# Patient Record
Sex: Male | Born: 1945
Health system: Southern US, Community
[De-identification: ages and names within clinical notes are randomized; demographics above are authoritative.]

## PROBLEM LIST (undated history)

## (undated) DIAGNOSIS — Z9889 Other specified postprocedural states: Secondary | ICD-10-CM

## (undated) DIAGNOSIS — M1990 Unspecified osteoarthritis, unspecified site: Secondary | ICD-10-CM

## (undated) DIAGNOSIS — R112 Nausea with vomiting, unspecified: Secondary | ICD-10-CM

## (undated) DIAGNOSIS — G4730 Sleep apnea, unspecified: Secondary | ICD-10-CM

## (undated) DIAGNOSIS — E785 Hyperlipidemia, unspecified: Secondary | ICD-10-CM

## (undated) DIAGNOSIS — T7840XA Allergy, unspecified, initial encounter: Secondary | ICD-10-CM

## (undated) DIAGNOSIS — C801 Malignant (primary) neoplasm, unspecified: Secondary | ICD-10-CM

## (undated) DIAGNOSIS — K219 Gastro-esophageal reflux disease without esophagitis: Secondary | ICD-10-CM

## (undated) DIAGNOSIS — Z87442 Personal history of urinary calculi: Secondary | ICD-10-CM

## (undated) DIAGNOSIS — H269 Unspecified cataract: Secondary | ICD-10-CM

## (undated) DIAGNOSIS — L719 Rosacea, unspecified: Secondary | ICD-10-CM

## (undated) DIAGNOSIS — R011 Cardiac murmur, unspecified: Secondary | ICD-10-CM

## (undated) DIAGNOSIS — I10 Essential (primary) hypertension: Secondary | ICD-10-CM

## (undated) HISTORY — DX: Rosacea, unspecified: L71.9

## (undated) HISTORY — DX: Allergy, unspecified, initial encounter: T78.40XA

## (undated) HISTORY — PX: INGUINAL HERNIA REPAIR: SUR1180

## (undated) HISTORY — DX: Sleep apnea, unspecified: G47.30

## (undated) HISTORY — DX: Unspecified cataract: H26.9

## (undated) HISTORY — PX: NOSE SURGERY: SHX723

## (undated) HISTORY — DX: Gastro-esophageal reflux disease without esophagitis: K21.9

## (undated) HISTORY — DX: Malignant (primary) neoplasm, unspecified: C80.1

## (undated) HISTORY — PX: ABDOMINAL HERNIA REPAIR: SHX539

## (undated) HISTORY — PX: HEMORRHOID SURGERY: SHX153

## (undated) HISTORY — PX: OTHER SURGICAL HISTORY: SHX169

## (undated) HISTORY — DX: Essential (primary) hypertension: I10

## (undated) HISTORY — DX: Hyperlipidemia, unspecified: E78.5

## (undated) HISTORY — DX: Cardiac murmur, unspecified: R01.1

## (undated) HISTORY — PX: TONSILLECTOMY: SUR1361

---

## 1998-04-06 ENCOUNTER — Ambulatory Visit: Admission: RE | Admit: 1998-04-06 | Discharge: 1998-04-06 | Payer: Self-pay | Admitting: Internal Medicine

## 2000-07-07 ENCOUNTER — Encounter: Payer: Self-pay | Admitting: Internal Medicine

## 2000-07-07 ENCOUNTER — Ambulatory Visit (HOSPITAL_COMMUNITY): Admission: RE | Admit: 2000-07-07 | Discharge: 2000-07-07 | Payer: Self-pay | Admitting: Internal Medicine

## 2003-01-07 ENCOUNTER — Ambulatory Visit (HOSPITAL_BASED_OUTPATIENT_CLINIC_OR_DEPARTMENT_OTHER): Admission: RE | Admit: 2003-01-07 | Discharge: 2003-01-07 | Payer: Self-pay | Admitting: Otolaryngology

## 2003-03-16 ENCOUNTER — Encounter: Payer: Self-pay | Admitting: Otolaryngology

## 2003-03-17 ENCOUNTER — Encounter (INDEPENDENT_AMBULATORY_CARE_PROVIDER_SITE_OTHER): Payer: Self-pay | Admitting: Specialist

## 2003-03-17 ENCOUNTER — Ambulatory Visit (HOSPITAL_COMMUNITY): Admission: RE | Admit: 2003-03-17 | Discharge: 2003-03-18 | Payer: Self-pay | Admitting: Otolaryngology

## 2003-04-01 HISTORY — PX: COLONOSCOPY: SHX174

## 2003-06-02 ENCOUNTER — Ambulatory Visit (HOSPITAL_BASED_OUTPATIENT_CLINIC_OR_DEPARTMENT_OTHER): Admission: RE | Admit: 2003-06-02 | Discharge: 2003-06-02 | Payer: Self-pay | Admitting: Otolaryngology

## 2003-06-02 ENCOUNTER — Ambulatory Visit (HOSPITAL_COMMUNITY): Admission: RE | Admit: 2003-06-02 | Discharge: 2003-06-02 | Payer: Self-pay | Admitting: Otolaryngology

## 2003-07-01 ENCOUNTER — Ambulatory Visit (HOSPITAL_BASED_OUTPATIENT_CLINIC_OR_DEPARTMENT_OTHER): Admission: RE | Admit: 2003-07-01 | Discharge: 2003-07-01 | Payer: Self-pay | Admitting: Otolaryngology

## 2003-07-01 ENCOUNTER — Ambulatory Visit (HOSPITAL_COMMUNITY): Admission: RE | Admit: 2003-07-01 | Discharge: 2003-07-01 | Payer: Self-pay | Admitting: Otolaryngology

## 2006-06-16 ENCOUNTER — Encounter (INDEPENDENT_AMBULATORY_CARE_PROVIDER_SITE_OTHER): Payer: Self-pay | Admitting: Specialist

## 2006-06-16 ENCOUNTER — Ambulatory Visit (HOSPITAL_COMMUNITY): Admission: RE | Admit: 2006-06-16 | Discharge: 2006-06-17 | Payer: Self-pay | Admitting: Surgery

## 2008-03-21 ENCOUNTER — Ambulatory Visit (HOSPITAL_COMMUNITY): Admission: RE | Admit: 2008-03-21 | Discharge: 2008-03-21 | Payer: Self-pay | Admitting: Urology

## 2010-03-22 ENCOUNTER — Ambulatory Visit (HOSPITAL_COMMUNITY): Admission: RE | Admit: 2010-03-22 | Discharge: 2010-03-22 | Payer: Self-pay | Admitting: Cardiology

## 2010-03-22 ENCOUNTER — Ambulatory Visit: Payer: Self-pay | Admitting: Cardiology

## 2010-10-23 ENCOUNTER — Telehealth: Payer: Self-pay | Admitting: Cardiology

## 2010-10-23 DIAGNOSIS — E785 Hyperlipidemia, unspecified: Secondary | ICD-10-CM

## 2010-10-23 MED ORDER — ATORVASTATIN CALCIUM 20 MG PO TABS: 20.0000 mg | ORAL_TABLET | Freq: Every day | ORAL | Status: DC

## 2010-10-23 NOTE — Telephone Encounter (Signed)
SAMPLES OF LIPITOR, PLACED CHART IN BOX.

## 2010-10-23 NOTE — Telephone Encounter (Signed)
Pt requesting Lipitor refill.  RN will send in refill for Lipitor.

## 2010-10-24 ENCOUNTER — Other Ambulatory Visit: Payer: Self-pay | Admitting: Cardiology

## 2010-10-24 DIAGNOSIS — I10 Essential (primary) hypertension: Secondary | ICD-10-CM

## 2010-10-24 MED ORDER — AMLODIPINE BESYLATE 5 MG PO TABS: 5.0000 mg | ORAL_TABLET | Freq: Every day | ORAL | Status: DC

## 2010-10-24 NOTE — Telephone Encounter (Signed)
CALL PT ABOUT HIS MEDS. PLACED CHART IN BOX.

## 2010-11-08 ENCOUNTER — Ambulatory Visit: Payer: Self-pay | Admitting: Sports Medicine

## 2010-11-15 ENCOUNTER — Encounter: Payer: Self-pay | Admitting: Sports Medicine

## 2010-11-15 ENCOUNTER — Ambulatory Visit (INDEPENDENT_AMBULATORY_CARE_PROVIDER_SITE_OTHER): Payer: BC Managed Care – PPO | Admitting: Sports Medicine

## 2010-11-15 VITALS — BP 131/84 | Ht 69.0 in | Wt 195.0 lb

## 2010-11-15 DIAGNOSIS — M216X9 Other acquired deformities of unspecified foot: Secondary | ICD-10-CM | POA: Insufficient documentation

## 2010-11-15 DIAGNOSIS — M7750 Other enthesopathy of unspecified foot: Secondary | ICD-10-CM

## 2010-11-15 DIAGNOSIS — M7741 Metatarsalgia, right foot: Secondary | ICD-10-CM

## 2010-11-15 NOTE — Patient Instructions (Signed)
Follow up if not improving.  Start using insoles with running and with regular day to day activities.

## 2010-11-15 NOTE — Progress Notes (Signed)
  Subjective:    Patient ID: Juan Hudson, male    DOB: [birth year], 65 y.o.   MRN: [id number]  HPI  65 year old male reports pain at ball of right foot x 6-8 weeks. No specific inciting injury. Worse with running, weight bearing in general. Motrin helps somewhat. Has gotten insoles which have not helped with his pain. Runs 18 mils per week. Patient is an [employer] features editor.   Review of Systems As above     Objective:   Physical Exam General: Pleasant gentleman, NAD  MSK: Bilateral transverse arch breakdown right > left with preservation of the longitudinal arches. Subluxation of the 2nd metatarsal head at right foot. Tender to palpation 2nd, 3rd metatarsal heads on right plantar surface. Hammer deformity 2nd PIP on right.  Bunionettes at 5th metatarsals bilaterally  Forefoot strike running gait  Full ROM ankles  No leg length discrepancy        Assessment & Plan:

## 2010-11-15 NOTE — Assessment & Plan Note (Signed)
Bilateral. Metatarsal pads (medium on right, small on left) provided for patient's insoles, also given green insoles with metatarsal pads. Advised regarding use of insoles with running etc. Follow up as needed.

## 2010-11-15 NOTE — Assessment & Plan Note (Addendum)
Secondary to breakdown of transverse arch. Metatarsal pads provided for patient's insoles, also given green insoles with metatarsal pads. Advised regarding use of insoles with running etc. Follow up as needed.

## 2010-12-07 NOTE — Op Note (Signed)
Juan Hudson, Juan Hudson                         ACCOUNT NO.:  [id number]   MEDICAL RECORD NO.:  [id number]                   PATIENT TYPE:  OIB   LOCATION:  2899                                 FACILITY:  [facility]   PHYSICIAN:  Kinnie Scales. Annalee Genta, M.D.            DATE OF BIRTH:  April 01, 1946   DATE OF PROCEDURE:  03/17/2003  DATE OF DISCHARGE:                                 OPERATIVE REPORT   PREOPERATIVE DIAGNOSIS:  1. Severe obstructive sleep apnea.  2. Uvulopalatal, tonsillar and tongue base hypertrophy.  3. Hypertension.   POSTOPERATIVE DIAGNOSIS:  1. Severe obstructive sleep apnea.  2. Uvulopalatal, tonsillar and tongue base hypertrophy.  3. Hypertension.   OPERATION PERFORMED:  1. Uvulopalatopharyngoplasty.  2. Tongue base somnoplasty.  3. Tonsillectomy.   SURGEON:  Kinnie Scales. Annalee Genta, M.D.   ANESTHESIA:  General endotracheal.   COMPLICATIONS:  None,.   ESTIMATED BLOOD LOSS:  Approximately 50mL.   DISPOSITION:  Patient transferred from the operating room to the recovery  room in stable condition.   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  The patient had a longstanding history of sleep apnea and had been using  CPAP effectively over the last approximately seven to 10 years.  A follow-up  sleep study was performed on December 28, 2002 without CPAP which showed an RDI  of 74 events per hour with an oxygen nadir of 76% and normal cardiac rhythm.  The patient was titrated on a CPAP at a pressure of 9 and had an RDI of 0  events per hour.  The patient was counseled regarding various options  regarding continued use of CPAP for control of obstructive sleep apnea  versus possible surgical intervention.  Although the patient tolerated CPAP,  he wished to consider surgical treatment and I counseled him regarding  uvulopalatopharyngoplasty, tonsillectomy and tongue base  somnoplasty.  The  risks, benefits and possible complications of each of these surgical  procedures were discussed with the patient and his wife, who understood and  concurred with our plan for surgery which was scheduled for March 17, 2003.   DESCRIPTION OF PROCEDURE:  The patient was brought to the operating room on  March 17, 2003, and placed in supine position on the operating table.  General endotracheal anesthesia was established without difficulty.  When  the patient was adequately anesthetized, the oral cavity and oropharynx were  examined.  There were no loose or broken teeth.  Hard and soft palate were  intact.  A Crowe-Davis mouth gag was inserted without difficulty.  The  patient was found to have 2+ tonsils with significant palatal and uvular  hypertrophy.  The surgical procedure was begun with a tonsillectomy.  Using  a Harmonic scalpel and dissecting in subcapsular fashion, the left tonsil  was removed from superior pole to tongue base.  The right tonsil was removed  in similar fashion and tonsil tissue was sent to pathology for gross and  microscopic evaluation.  With tonsil tissue removed, the tonsillar fossae  were gently abraded with a dry tonsil sponge.  Several areas of point  hemorrhage were cauterized using suction cautery.  Uvulopalatopharyngoplasty  was then undertaken.  With tonsillectomy completed, the posterior extent of  the patient's soft palate was estimated and an approximately 1 to 2 cm  portion of palate was demarcated along the anterior tonsillar pillars and  anterior aspect of the palate.  Using a Harmonic scalpel, dissection was  carried out from anterior to posterior, preserving posterior palatal mucosa  and removing tissue including mucosa, palatal muscle and the entire uvula  and preserving the majority of the posterior tonsillar pillars.  With the  tissue resected, the patient's nasopharynx and oropharynx were widely  patent.  Reconstruction was  then undertaken using a 3-0 Vicryl suture on a  tapered needle in a horizontal mattressing fashion to advance the posterior  tonsillar pillars and close tonsillar fossae.  The preserved posterior  palatal mucosa was then reflected anteriorly and closed in multiple  interrupted sutures consisting of 3-0 Vicryl suture.  The patient's oral  cavity, oropharynx, nasal cavity and nasopharynx were then irrigated and  suctioned.  There was no active bleeding.  The palate was in good anatomic  position.   With completion of the palatal and tonsillar surgery, tongue base  somnoplasty was undertaken. The patient's tongue base was demarcated  approximately 1 cm posterior to the circumvallate papillae with the surgical  site and four lesions were anticipated within the tongue base.  The patient  was injected with 2.66mL of sterile saline in each of the four proposed  surgical sites which were set up in a diamond shaped pattern anterior to  posterior and lateral of the posterior tongue base.  The dual handpiece  somnoplasty probe was then inserted into each one of the surgical sites and  1200 j of energy was delivered in each site.  There was no bleeding.  The  patient had no active swelling of the tongue base.  The patient's oral  cavity and oropharynx were again irrigated and suctioned and an orogastric  tube was passed and the stomach contents were aspirated.  The patient was  then awakened from his anesthetic.  He was extubated without difficulty and  was transferred from the operating room to the recovery room in stable  condition.                                               Kinnie Scales. Annalee Genta, M.D.    DLS/MEDQ  D:  91/47/8295  T:  03/17/2003  Job:  [id number]

## 2010-12-07 NOTE — Op Note (Signed)
NAMEREYANSH, KUSHNIR               ACCOUNT NO.:  [id number]   MEDICAL RECORD NO.:  [id number]          PATIENT TYPE:  AMB   LOCATION:  DAY                          FACILITY:  [facility]   PHYSICIAN:  Sandria Bales. Ezzard Standing, M.D.  DATE OF BIRTH:  01/12/1946   DATE OF PROCEDURE:  DATE OF DISCHARGE:                               OPERATIVE REPORT   PREOPERATIVE DIAGNOSES:  Right inguinal hernia and grade 3 prolapsing  internal hemorrhoid.   POSTOPERATIVE DIAGNOSES:  Direct right inguinal hernia, small indirect  inguinal hernia component, and grade 3 prolapsing internal hemorrhoid.   PROCEDURE:  Open right inguinal hernia repair and rigid sigmoidoscopy  with PPH.   SURGEON:  Sandria Bales. Ezzard Standing, M.D.   FIRST ASSISTANT:  None.   ANESTHESIA:  General endotracheal.   ESTIMATED BLOOD LOSS:  Minimal.   INDICATIONS FOR PROCEDURE:  Mr. Dipasquale is a 65 year old white male who  has both a symptomatic right inguinal hernia and prolapsing internal  hemorrhoids which come out with every bowel movement.  I have discussed  with him both right inguinal hernia repair and PPH procedure for  hemorrhoids.   The inguinal hernia I discussed with him the indications and potential  complications.  The potential complication include, but not limited to,  bleeding, infection, nerve injury and possible recurrence of hernia.   I discussed with him the [facility] procedure, which can be done at the same  time, I believe.  The risks would include infection, bleeding, recurrent  hemorrhoids and pain if the stapes catch the squamocolumnar junction.   OPERATIVE NOTE:  The patient underwent general anesthesia.  I had marked  the right inguinal area and started with this as the first part of the  operation.  With the patient in supine position, his right groin was  prepped with Betadine solution and sterilely draped.   A right inguinal incision was made and carried down to the external  oblique fascia which was opened, the  external ring opened, and the cord  structures encircled with a Penrose drain.  The ilioinguinal nerve was  identified and spared during the dissection.  The cord structures were  encircled with a Penrose drain.  The patient had a patulous inguinal  floor consistent with a direct inguinal hernia.  He had a very small  indirect component which came up anterior and medial to the cord  structures and I ligated the indirect component with a hemostat.   I then carried out an inguinal floor repair using a piece of Marlex  (polypropylene) mesh which I placed this over the inguinal floor.  The  mesh was approximately 2.5 by 5 inches.  I used a 0-Novofil suture to  tack the mesh down.  I sutured the mesh medially to the pubic tubercle,  inferiorly to the ileo-inguinal ligament, and superiorly transversalis  fascia.  I cut a keyholewas created in the mesh for the internal ring  and then fashioned this around the cord structures.  A suture closed  behind the keyhole.   I then infiltrated the tissues with about 25 mL of 0.25% Marcaine and I  closed the external oblique fascia, returned the cord structures to  their normal location and closed the external oblique fascia with  interrupted 3-0 Vicryl suture, closed the subcutaneous tissue with 3-0  Vicryl suture, skin with a 5-0 Monocryl and painted with Tincture of  Benzoin.  The right groin was sterilely dresed.   We then rolled the patient in a prone position.  We padded his knees,  his head and placed chest rolls under his legs.  Monitored his airway.   I then taped his buttocks apart, did a rigid sigmoidoscopy to about 15  cm.  I saw no polyp, mass or lesion.  I then carried out a PPH  procedure.   I used the Fansler anoscope.  I tried to place a circumferential ring of  2-0 prolene at about 5 cm above the junction of the rectum with the anal  skin (dentate line).  I then placed a 33 PPH in there and tied down on  this using the introducer  for this.   I fired the [facility] stapler and got a ring of about 2 cm of mucosal tissue  in a ring. However, looking on the staple line, it looked like the  staple line was slightly tilted to the left side and came close to the  squamo-anal junction on the left side.   Therefore, I anticipate the patient to have some more pain post-  procedure than would be expected; but otherwise, the staple line was  dry.  The tissue was sent to pathology.  The patient was transported to  the recovery room in good condition.  Sponge and anal count correct at  the end of the case.   Because of this possible increased pain from his hemorrhoidectomy and  because of his hernia, I will plan to keep him overnight for  observation; unless he does well, I could let him go later this evening.      Sandria Bales. Ezzard Standing, M.D.  Electronically Signed     DHN/MEDQ  D:  06/16/2006  T:  06/16/2006  Job:  [id number]   cc:   Evelena Peat, M.D.  Fax: [phone]

## 2010-12-07 NOTE — Op Note (Signed)
NAME:  Juan Hudson, Juan Hudson                         ACCOUNT NO.:  [id number]   MEDICAL RECORD NO.:  [id number]                   PATIENT TYPE:  AMB   LOCATION:  DSC                                  FACILITY:  [facility]   PHYSICIAN:  Onalee Hua L. Annalee Genta, M.D.            DATE OF BIRTH:  July 02, 1946   DATE OF PROCEDURE:  07/01/2003  DATE OF DISCHARGE:                                 OPERATIVE REPORT   PREOPERATIVE DIAGNOSES:  1. Obstructive sleep apnea.  2. Base of tongue hypertrophy.  3. Status post uvulopalatal pharyngoplasty, tonsillectomy and primary base     of tongue radiofrequency ablation.   POSTOPERATIVE DIAGNOSES:  1. Obstructive sleep apnea.  2. Base of tongue hypertrophy.  3. Status post uvulopalatal pharyngoplasty, tonsillectomy and primary base     of tongue radiofrequency ablation.   PROCEDURE:  Base of tongue radiofrequency ablation.   ANESTHESIA:  General endotracheal anesthesia.   SURGEON:  Kinnie Scales. Annalee Genta, M.D.   COMPLICATIONS:  None.   ESTIMATED BLOOD LOSS:  Minimal.   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  He has been using CPAP on an ongoing nightly basis and underwent surgical  intervention for management of obstructive sleep apnea which consisted of  uvulopalatopharyngoplasty, tonsillectomy and primary base of tongue  radiofrequency ablation on March 17, 2003, at [facility]. The  patient did well after the surgery and had a dramatic and symptomatic  subjective improvement in snoring and airway obstruction. He presents today  for a 2nd base of tongue radiofrequency ablation procedure. The risks,  benefits and possible  complications of the procedure were discussed  in  detail with the patient and his wife who understood and concurred with our  plan for surgery which was scheduled for July 01, 2003.   DESCRIPTION OF PROCEDURE:  The  patient was brought to the operating room on  July 01, 2003, and placed in the supine position on the operating table.  General endotracheal anesthesia was established without difficulty. When the  patient was adequately anesthetized, his oral cavity and oropharynx were  examined. There were no loose or broken teeth and no bleeding. The patient  was status post uvulopalatopharyngoplasty.   A penetrating tenaculum was  then  used to retract the tongue anteriorly  and the base of the tongue was examined. The tongue was marked in a 4  quadrant fashion with 2 points along the midline  and  2 immediately  lateral to the midline at the level of the circumvallate papillae. Markers  were designed to correspond to areas not previously treated with tongue base  radiofrequency ablation.   Using the somnoplasty device with a dual tongue base problem, the patient's  tongue was treated with 600 joules of radiofrequency energy in the tongue  base. A total of 4 dual probe  lesions were created. There was no bleeding.  The patient's oral cavity and oropharynx were irrigated and suctioned and an  orogastric tube was passed and the stomach  contents were aspirated. There  was no swelling, bleeding or evidence of infection.   The patient was awakened from his anesthetic. He was extubated and was then  transferred from the operating room to the recovery room in stable condition  without problems or complications.   The patient was transferred from the operating room to the recovery room in  stable condition.                                               Kinnie Scales. Annalee Genta, M.D.    DLS/MEDQ  D:  16/04/9603  T:  07/02/2003  Job:  [id number]

## 2010-12-07 NOTE — Op Note (Signed)
NAME:  Juan Hudson, Juan Hudson                         ACCOUNT NO.:  [id number]   MEDICAL RECORD NO.:  [id number]                   PATIENT TYPE:  AMB   LOCATION:  DSC                                  FACILITY:  [facility]   PHYSICIAN:  Onalee Hua L. Annalee Genta, M.D.            DATE OF BIRTH:  1945-11-19   DATE OF PROCEDURE:  06/02/2003  DATE OF DISCHARGE:                                 OPERATIVE REPORT   PREOPERATIVE DIAGNOSIS:  1. Moderately severe obstructive sleep apnea.  2. Status post prior uvulopalatopharyngoplasty and radiofrequency ablation     of the tongue base.   PROPOSED SURGICAL PROCEDURE:  Radiofrequency ablation, tongue base.   SURGEON:  Kinnie Scales. Annalee Genta, M.D.   ANESTHESIA:  General endotracheal.   COMPLICATIONS:  None.   ESTIMATED BLOOD LOSS:  None.   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  He  uses CPAP on a nightly basis.  He underwent uvulopalatopharyngoplasty and  initial tongue base somnoplasty performed under general anesthesia on March 17, 2003 at [facility].  The patient had a significant improvement  in symptoms but continued to have some night time airway obstruction using  CPAP on a nightly basis.  Given his history and examination, I recommended  repeat tongue base radiofrequency ablation.  The risks and benefits of the  procedure were discussed in detail with the patient and his wife and they  understood and concurred with our plan for surgery which was scheduled on  June 02, 2003 at [facility].   DESCRIPTION OF PROCEDURE:  The patient was brought to the operating room on  June 02, 2003 and placed in supine position on the operating table.  General endotracheal anesthesia was established without difficulty.  The  face was prepped and draped in sterile fashion.  His oral cavity and  oropharynx were examined and there  were no loose or broken teeth.  The  tongue was distracted anteriorly and the tongue base was marked at the  proposed surgical site. The somnoplasty procedure was then set up and the  somnoplasty device was activated.  On initial insertion of the somnoplasty  probe into the base of tongue, the device had a complete malfunction and the  system shut down.  We were unable to continue with the procedure.  There was  no complication or injury to the patient.  His oral cavity and oropharynx  were then irrigated and suctioned. He was awakened from his anesthetic.  He  was extubated and transferred from the operating room to the recovery room  in stable condition.  No surgical intervention was performed.  The above  findings were discussed in detail with the patient and his wife and they  understood and surgery is to be rescheduled when the device is functioning.  Kinnie Scales. Annalee Genta, M.D.    DLS/MEDQ  D:  07/24/7251  T:  06/02/2003  Job:  [id number]

## 2010-12-10 ENCOUNTER — Encounter: Payer: Self-pay | Admitting: Urology

## 2010-12-20 ENCOUNTER — Other Ambulatory Visit: Payer: Self-pay | Admitting: Cardiology

## 2011-01-16 ENCOUNTER — Ambulatory Visit (INDEPENDENT_AMBULATORY_CARE_PROVIDER_SITE_OTHER): Payer: BC Managed Care – PPO | Admitting: Family Medicine

## 2011-01-16 ENCOUNTER — Encounter: Payer: Self-pay | Admitting: Family Medicine

## 2011-01-16 DIAGNOSIS — M79673 Pain in unspecified foot: Secondary | ICD-10-CM

## 2011-01-16 DIAGNOSIS — M7750 Other enthesopathy of unspecified foot: Secondary | ICD-10-CM

## 2011-01-16 DIAGNOSIS — M216X9 Other acquired deformities of unspecified foot: Secondary | ICD-10-CM

## 2011-01-16 DIAGNOSIS — M79609 Pain in unspecified limb: Secondary | ICD-10-CM

## 2011-01-16 DIAGNOSIS — M7741 Metatarsalgia, right foot: Secondary | ICD-10-CM

## 2011-01-16 NOTE — Progress Notes (Signed)
  Subjective:    Patient ID: Juan Hudson, male    DOB: 1945-09-11, 65 y.o.   MRN: [id number]  HPI 65 yo M f/u Rt foot metatarsalgia and Lt 2nd hammertoe.  States the MT pads given to him 2 months ago work well.  Still has not run in 3-4 months nor walked significantly until yesterday he walked 5 miles, now having some worse Lt forefoot pain, Lt groin pain, and hip pain.  Interested in custom orthotics today.  Worried about Morton's neuroma, but declines numbness/paresthesias into toes. Still thinks pain is 75% better.   Review of Systems Denies f/c/s    Objective:   Physical Exam Gen: NAD Rt foot: mild external rotation of foot as a whole.  Preserved long arch with breakdown of transv arch.  Mild ttp over 2nd MT head with mild overlying callus, but improved since last visit per patient.  Significant hammering of Rt 2nd toe with some mild blue and ecchymotic discoloration.  + 5th toe bunionette. B/l feet: good post tib function.  Minimal transv arch breakdown on Lt foot. Leg lengths equal Good hip strength b/l. FROM of b/l hips without evidence of OA.       Assessment & Plan:  Rt foot metatarsalgia with transv arch collapse and Lt 2nd toe hammertoe.   - custom orthotics today - wanted to try hammertoe guard, but only had for lt foot in clinic today, will try to get these in stock and have patient f/u in future - Counseled about gradually increasing intensity and duration of walking/running.  I am not too worried about his Lt leg/groin/hip pain since he hasn't walked/run a lot for months and then tried 5 mile walk yesterday. - f/u 2-3 months or prn if doing great  Patient was fitted for a : standard, cushioned, semi-rigid orthotic. The orthotic was heated and afterward the patient stood on the orthotic blank positioned on the orthotic stand. The patient was positioned in subtalar neutral position and 10 degrees of ankle dorsiflexion in a weight bearing stance. After completion of  molding, a stable base was applied to the orthotic blank. The blank was ground to a stable position for weight bearing. Size: 10 blue swirl Base: EVA Posting: none Additional orthotic padding: medium MT pad on Rt, small MT pad on LT  These felt good to patient prior to leaving.

## 2011-01-24 ENCOUNTER — Telehealth: Payer: Self-pay | Admitting: Internal Medicine

## 2011-01-24 NOTE — Telephone Encounter (Signed)
PATIENT HAS NOT SEEN DR YOUNG FOR 10 YEARS.  HE SCHEDULED SLEEP CONSULT ON 7/27.

## 2011-01-24 NOTE — Telephone Encounter (Signed)
ATC pt on cell number and call was dropped. I do not see where this pt has ever seen Dr. Maple Hudson in this office? [id number]. Carron Curie, CMA

## 2011-02-14 ENCOUNTER — Telehealth: Payer: Self-pay | Admitting: Nurse Practitioner

## 2011-02-14 NOTE — Telephone Encounter (Signed)
Received faxed signed release from [facility] for last 2-3 years OV notes , any stress tests, and/or echo reports, these were faxed to [phone] today

## 2011-02-15 ENCOUNTER — Ambulatory Visit (INDEPENDENT_AMBULATORY_CARE_PROVIDER_SITE_OTHER): Payer: BC Managed Care – PPO | Admitting: Internal Medicine

## 2011-02-15 ENCOUNTER — Encounter: Payer: Self-pay | Admitting: Internal Medicine

## 2011-02-15 VITALS — BP 136/86 | HR 73 | Ht 67.0 in | Wt 202.6 lb

## 2011-02-15 DIAGNOSIS — G4733 Obstructive sleep apnea (adult) (pediatric): Secondary | ICD-10-CM

## 2011-02-15 NOTE — Patient Instructions (Signed)
Script-   Replacement CPAP machine, heated humidifier, mask of choice and supplies   8 cwp

## 2011-02-15 NOTE — Progress Notes (Signed)
Subjective:    Patient ID: Juan Hudson, male    DOB: Mar 19, 1946, 65 y.o.   MRN: [id number]  HPI 02/15/11- 34 yoM former smoker, Magazine features editor,  re-establishing for sleep apnea management.  Last seen 6 years ago. He has been fully compliant with CPAP 8 cwp. Old machine is now wearing out and wife tells him it is making noise. He is not snoring through it, sleeps soundly, wakes rested. NPSG 04/06/98- RDI/AHI 45/hr.  He had UPPP surgery and septoplasty by Dr Annalee Genta, but unsuccessfull. Sleep questionnaire entered and reviewed. Short latency. Bedtime 9-10 PM, up at 5:15 AM.  Medical management of HBP.  Review of Systems Constitutional:   No-   weight loss, night sweats, fevers, chills, fatigue, lassitude. HEENT:   No-   headaches, difficulty swallowing, tooth/dental problems, sore throat,                  No-   sneezing, itching, ear ache, nasal congestion, post nasal drip,   CV:  No-   chest pain, orthopnea, PND, swelling in lower extremities, anasarca, dizziness, palpitations  GI:  No-   heartburn, indigestion, abdominal pain, nausea, vomiting, diarrhea,                 change in bowel habits, loss of appetite  Resp: No-   shortness of breath with exertion or at rest.  No-  excess mucus,             No-   productive cough,  No non-productive cough,  No-  coughing up of blood.              No-   change in color of mucus.  No- wheezing.    Skin: No-   rash or lesions.  GU: No-   dysuria, change in color of urine, no urgency or frequency.  No- flank pain.  MS:  No-   joint pain or swelling.  No- decreased range of motion.  No- back pain.  Psych:  No- change in mood or affect. No depression or anxiety.  No memory loss.      Objective:   Physical Exam General- Alert, Oriented, Affect-appropriate, Distress- none acute , healthy appearing Skin- rash-none, lesions- none, excoriation- none Lymphadenopathy- none Head- atraumatic            Eyes- Gross vision intact, PERRLA, conjunctivae  clear secretions            Ears- Hearing, canals normal            Nose- Clear, No-Septal dev, mucus, polyps, erosion, perforation             Throat- S/p UPPP , mucosa clear , drainage- none, tonsils- atrophic Neck- flexible , trachea midline, no stridor , thyroid nl, carotid no bruit Chest - symmetrical excursion , unlabored           Heart/CV- RRR , no murmur , no gallop  , no rub, nl s1 s2                           - JVD- none , edema- none, stasis changes- none, varices- none           Lung- clear to P&A, wheeze- none, cough- none , dullness-none, rub- none           Chest wall-  Abd- tender-no, distended-no, bowel sounds-present, HSM- no Br/ Gen/ Rectal- Not done, not indicated Extrem- cyanosis- none, clubbing,  none, atrophy- none, strength- nl Neuro- grossly intact to observation         Assessment & Plan:   No problem-specific assessment & plan notes found for this encounter.

## 2011-02-15 NOTE — Assessment & Plan Note (Addendum)
Good compliance and control. Worn out CPAP machine, but ok to replace. We have his old sleep study. Pressure seems appropriate, but can be assessed later if needed.

## 2011-03-01 ENCOUNTER — Encounter: Payer: Self-pay | Admitting: Internal Medicine

## 2011-12-15 ENCOUNTER — Encounter: Payer: Self-pay | Admitting: *Deleted

## 2011-12-18 ENCOUNTER — Other Ambulatory Visit: Payer: Self-pay | Admitting: Cardiology

## 2011-12-20 ENCOUNTER — Other Ambulatory Visit: Payer: Self-pay | Admitting: Dermatology

## 2011-12-20 ENCOUNTER — Encounter: Payer: Self-pay | Admitting: *Deleted

## 2011-12-20 DIAGNOSIS — L570 Actinic keratosis: Secondary | ICD-10-CM | POA: Diagnosis not present

## 2011-12-20 DIAGNOSIS — L719 Rosacea, unspecified: Secondary | ICD-10-CM | POA: Diagnosis not present

## 2011-12-20 DIAGNOSIS — D485 Neoplasm of uncertain behavior of skin: Secondary | ICD-10-CM | POA: Diagnosis not present

## 2011-12-20 DIAGNOSIS — D0460 Carcinoma in situ of skin of unspecified upper limb, including shoulder: Secondary | ICD-10-CM | POA: Diagnosis not present

## 2011-12-20 DIAGNOSIS — Z85828 Personal history of other malignant neoplasm of skin: Secondary | ICD-10-CM | POA: Diagnosis not present

## 2011-12-20 DIAGNOSIS — C4441 Basal cell carcinoma of skin of scalp and neck: Secondary | ICD-10-CM | POA: Diagnosis not present

## 2011-12-20 DIAGNOSIS — D2370 Other benign neoplasm of skin of unspecified lower limb, including hip: Secondary | ICD-10-CM | POA: Diagnosis not present

## 2011-12-20 DIAGNOSIS — B079 Viral wart, unspecified: Secondary | ICD-10-CM | POA: Diagnosis not present

## 2011-12-20 DIAGNOSIS — D239 Other benign neoplasm of skin, unspecified: Secondary | ICD-10-CM | POA: Diagnosis not present

## 2011-12-20 DIAGNOSIS — L821 Other seborrheic keratosis: Secondary | ICD-10-CM | POA: Diagnosis not present

## 2012-02-17 ENCOUNTER — Ambulatory Visit: Payer: BC Managed Care – PPO | Admitting: Internal Medicine

## 2012-03-06 DIAGNOSIS — D485 Neoplasm of uncertain behavior of skin: Secondary | ICD-10-CM | POA: Diagnosis not present

## 2012-03-06 DIAGNOSIS — L570 Actinic keratosis: Secondary | ICD-10-CM | POA: Diagnosis not present

## 2012-03-06 DIAGNOSIS — D0439 Carcinoma in situ of skin of other parts of face: Secondary | ICD-10-CM | POA: Diagnosis not present

## 2012-03-06 DIAGNOSIS — L905 Scar conditions and fibrosis of skin: Secondary | ICD-10-CM | POA: Diagnosis not present

## 2012-03-06 DIAGNOSIS — C44319 Basal cell carcinoma of skin of other parts of face: Secondary | ICD-10-CM | POA: Diagnosis not present

## 2012-04-15 DIAGNOSIS — E291 Testicular hypofunction: Secondary | ICD-10-CM | POA: Diagnosis not present

## 2012-04-15 DIAGNOSIS — R6882 Decreased libido: Secondary | ICD-10-CM | POA: Diagnosis not present

## 2012-04-15 DIAGNOSIS — N529 Male erectile dysfunction, unspecified: Secondary | ICD-10-CM | POA: Diagnosis not present

## 2012-05-01 DIAGNOSIS — I10 Essential (primary) hypertension: Secondary | ICD-10-CM | POA: Diagnosis not present

## 2012-05-01 DIAGNOSIS — E785 Hyperlipidemia, unspecified: Secondary | ICD-10-CM | POA: Diagnosis not present

## 2012-05-01 DIAGNOSIS — Z125 Encounter for screening for malignant neoplasm of prostate: Secondary | ICD-10-CM | POA: Diagnosis not present

## 2012-05-06 DIAGNOSIS — E291 Testicular hypofunction: Secondary | ICD-10-CM | POA: Diagnosis not present

## 2012-05-08 DIAGNOSIS — E785 Hyperlipidemia, unspecified: Secondary | ICD-10-CM | POA: Diagnosis not present

## 2012-05-08 DIAGNOSIS — I10 Essential (primary) hypertension: Secondary | ICD-10-CM | POA: Diagnosis not present

## 2012-05-08 DIAGNOSIS — Z0000 Encounter for general adult medical examination without abnormal findings: Secondary | ICD-10-CM | POA: Diagnosis not present

## 2012-05-08 DIAGNOSIS — Z1331 Encounter for screening for depression: Secondary | ICD-10-CM | POA: Diagnosis not present

## 2012-05-08 DIAGNOSIS — Z1212 Encounter for screening for malignant neoplasm of rectum: Secondary | ICD-10-CM | POA: Diagnosis not present

## 2012-05-08 DIAGNOSIS — R011 Cardiac murmur, unspecified: Secondary | ICD-10-CM | POA: Diagnosis not present

## 2012-05-25 DIAGNOSIS — E291 Testicular hypofunction: Secondary | ICD-10-CM | POA: Diagnosis not present

## 2012-06-02 DIAGNOSIS — R030 Elevated blood-pressure reading, without diagnosis of hypertension: Secondary | ICD-10-CM | POA: Diagnosis not present

## 2012-06-05 DIAGNOSIS — R030 Elevated blood-pressure reading, without diagnosis of hypertension: Secondary | ICD-10-CM | POA: Diagnosis not present

## 2012-06-10 ENCOUNTER — Encounter: Payer: Self-pay | Admitting: Cardiology

## 2012-06-26 ENCOUNTER — Other Ambulatory Visit: Payer: Self-pay | Admitting: Dermatology

## 2012-06-26 DIAGNOSIS — L723 Sebaceous cyst: Secondary | ICD-10-CM | POA: Diagnosis not present

## 2012-06-26 DIAGNOSIS — D485 Neoplasm of uncertain behavior of skin: Secondary | ICD-10-CM | POA: Diagnosis not present

## 2012-06-26 DIAGNOSIS — Z85828 Personal history of other malignant neoplasm of skin: Secondary | ICD-10-CM | POA: Diagnosis not present

## 2012-06-26 DIAGNOSIS — L570 Actinic keratosis: Secondary | ICD-10-CM | POA: Diagnosis not present

## 2012-06-26 DIAGNOSIS — L821 Other seborrheic keratosis: Secondary | ICD-10-CM | POA: Diagnosis not present

## 2012-06-26 DIAGNOSIS — D2370 Other benign neoplasm of skin of unspecified lower limb, including hip: Secondary | ICD-10-CM | POA: Diagnosis not present

## 2012-08-14 ENCOUNTER — Other Ambulatory Visit: Payer: Self-pay | Admitting: Dermatology

## 2012-09-11 DIAGNOSIS — L570 Actinic keratosis: Secondary | ICD-10-CM | POA: Diagnosis not present

## 2012-09-14 ENCOUNTER — Encounter: Payer: Self-pay | Admitting: Cardiology

## 2012-09-17 ENCOUNTER — Other Ambulatory Visit: Payer: Self-pay | Admitting: Dermatology

## 2012-09-17 DIAGNOSIS — C44529 Squamous cell carcinoma of skin of other part of trunk: Secondary | ICD-10-CM | POA: Diagnosis not present

## 2012-10-08 ENCOUNTER — Other Ambulatory Visit: Payer: Self-pay | Admitting: Dermatology

## 2012-10-08 DIAGNOSIS — C44319 Basal cell carcinoma of skin of other parts of face: Secondary | ICD-10-CM | POA: Diagnosis not present

## 2012-10-08 DIAGNOSIS — D239 Other benign neoplasm of skin, unspecified: Secondary | ICD-10-CM | POA: Diagnosis not present

## 2012-10-08 DIAGNOSIS — L570 Actinic keratosis: Secondary | ICD-10-CM | POA: Diagnosis not present

## 2012-10-08 DIAGNOSIS — Z85828 Personal history of other malignant neoplasm of skin: Secondary | ICD-10-CM | POA: Diagnosis not present

## 2012-10-16 DIAGNOSIS — E291 Testicular hypofunction: Secondary | ICD-10-CM | POA: Diagnosis not present

## 2012-10-16 DIAGNOSIS — R6882 Decreased libido: Secondary | ICD-10-CM | POA: Diagnosis not present

## 2012-10-16 DIAGNOSIS — N529 Male erectile dysfunction, unspecified: Secondary | ICD-10-CM | POA: Diagnosis not present

## 2012-10-19 DIAGNOSIS — L570 Actinic keratosis: Secondary | ICD-10-CM | POA: Diagnosis not present

## 2012-11-09 DIAGNOSIS — H2510 Age-related nuclear cataract, unspecified eye: Secondary | ICD-10-CM | POA: Diagnosis not present

## 2012-11-23 DIAGNOSIS — C44111 Basal cell carcinoma of skin of unspecified eyelid, including canthus: Secondary | ICD-10-CM | POA: Diagnosis not present

## 2012-12-11 ENCOUNTER — Other Ambulatory Visit: Payer: Self-pay | Admitting: Dermatology

## 2012-12-11 DIAGNOSIS — D485 Neoplasm of uncertain behavior of skin: Secondary | ICD-10-CM | POA: Diagnosis not present

## 2012-12-11 DIAGNOSIS — L570 Actinic keratosis: Secondary | ICD-10-CM | POA: Diagnosis not present

## 2013-01-21 DIAGNOSIS — L259 Unspecified contact dermatitis, unspecified cause: Secondary | ICD-10-CM | POA: Diagnosis not present

## 2013-02-16 ENCOUNTER — Ambulatory Visit (INDEPENDENT_AMBULATORY_CARE_PROVIDER_SITE_OTHER): Payer: Medicare Other | Admitting: Sports Medicine

## 2013-02-16 VITALS — BP 116/69 | Ht 70.0 in | Wt 180.0 lb

## 2013-02-16 DIAGNOSIS — M79609 Pain in unspecified limb: Secondary | ICD-10-CM

## 2013-02-16 DIAGNOSIS — M7741 Metatarsalgia, right foot: Secondary | ICD-10-CM

## 2013-02-16 DIAGNOSIS — M79671 Pain in right foot: Secondary | ICD-10-CM

## 2013-02-16 DIAGNOSIS — M7750 Other enthesopathy of unspecified foot: Secondary | ICD-10-CM | POA: Diagnosis not present

## 2013-02-16 NOTE — Assessment & Plan Note (Signed)
This has done very well with custom orthotics and I think these are good for another couple years  Structure looks well supported

## 2013-02-16 NOTE — Assessment & Plan Note (Signed)
Right second toe as the area of primary pain  We placed a metatarsal cookie as opposed a metatarsal pad  I would like him to start using a hammertoe pad  We will see if these lessen his pain but if not can make other changes in the orthotic

## 2013-02-16 NOTE — Progress Notes (Signed)
Patient ID: Juan Hudson, male   DOB: October 07, 1945, 67 y.o.   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 [facility] He thinks that getting back into a program to train more regularly will help motivate him  Since his last visit he has developed more of a hammertoe on the right second toe This was somewhat prominent when I saw him and made orthotics 2 years ago Has metatarsal pain resolved with metatarsal pads and orthotics  Physical examination  Muscular older male in no acute distress  Transverse arch bilaterally is flattened Calluses are not significant today Right second toe shows significant hammering at the MTP joint and the PIP there is flexion Left second toe does not show significant hammer No tenderness over the metatarsals Right and left great toe has good motion  Gait is neutral in orthotics

## 2013-03-19 DIAGNOSIS — R7611 Nonspecific reaction to tuberculin skin test without active tuberculosis: Secondary | ICD-10-CM | POA: Diagnosis not present

## 2013-03-19 DIAGNOSIS — R911 Solitary pulmonary nodule: Secondary | ICD-10-CM | POA: Diagnosis not present

## 2013-03-19 DIAGNOSIS — M47814 Spondylosis without myelopathy or radiculopathy, thoracic region: Secondary | ICD-10-CM | POA: Diagnosis not present

## 2013-04-20 ENCOUNTER — Other Ambulatory Visit: Payer: Self-pay | Admitting: Dermatology

## 2013-04-20 DIAGNOSIS — D485 Neoplasm of uncertain behavior of skin: Secondary | ICD-10-CM | POA: Diagnosis not present

## 2013-04-20 DIAGNOSIS — Z85828 Personal history of other malignant neoplasm of skin: Secondary | ICD-10-CM | POA: Diagnosis not present

## 2013-04-20 DIAGNOSIS — L570 Actinic keratosis: Secondary | ICD-10-CM | POA: Diagnosis not present

## 2013-04-20 DIAGNOSIS — D239 Other benign neoplasm of skin, unspecified: Secondary | ICD-10-CM | POA: Diagnosis not present

## 2013-05-06 DIAGNOSIS — E785 Hyperlipidemia, unspecified: Secondary | ICD-10-CM | POA: Diagnosis not present

## 2013-05-06 DIAGNOSIS — I10 Essential (primary) hypertension: Secondary | ICD-10-CM | POA: Diagnosis not present

## 2013-05-06 DIAGNOSIS — Z125 Encounter for screening for malignant neoplasm of prostate: Secondary | ICD-10-CM | POA: Diagnosis not present

## 2013-05-10 DIAGNOSIS — Z1212 Encounter for screening for malignant neoplasm of rectum: Secondary | ICD-10-CM | POA: Diagnosis not present

## 2013-05-10 DIAGNOSIS — Z23 Encounter for immunization: Secondary | ICD-10-CM | POA: Diagnosis not present

## 2013-05-10 DIAGNOSIS — E785 Hyperlipidemia, unspecified: Secondary | ICD-10-CM | POA: Diagnosis not present

## 2013-05-10 DIAGNOSIS — R030 Elevated blood-pressure reading, without diagnosis of hypertension: Secondary | ICD-10-CM | POA: Diagnosis not present

## 2013-05-10 DIAGNOSIS — R6882 Decreased libido: Secondary | ICD-10-CM | POA: Diagnosis not present

## 2013-05-10 DIAGNOSIS — E291 Testicular hypofunction: Secondary | ICD-10-CM | POA: Diagnosis not present

## 2013-05-10 DIAGNOSIS — R011 Cardiac murmur, unspecified: Secondary | ICD-10-CM | POA: Diagnosis not present

## 2013-05-10 DIAGNOSIS — K649 Unspecified hemorrhoids: Secondary | ICD-10-CM | POA: Diagnosis not present

## 2013-05-10 DIAGNOSIS — Z0000 Encounter for general adult medical examination without abnormal findings: Secondary | ICD-10-CM | POA: Diagnosis not present

## 2013-05-10 DIAGNOSIS — J984 Other disorders of lung: Secondary | ICD-10-CM | POA: Diagnosis not present

## 2013-05-10 DIAGNOSIS — Z125 Encounter for screening for malignant neoplasm of prostate: Secondary | ICD-10-CM | POA: Diagnosis not present

## 2013-05-10 DIAGNOSIS — H9190 Unspecified hearing loss, unspecified ear: Secondary | ICD-10-CM | POA: Diagnosis not present

## 2013-05-10 DIAGNOSIS — N529 Male erectile dysfunction, unspecified: Secondary | ICD-10-CM | POA: Diagnosis not present

## 2013-05-10 DIAGNOSIS — I10 Essential (primary) hypertension: Secondary | ICD-10-CM | POA: Diagnosis not present

## 2013-05-10 DIAGNOSIS — R413 Other amnesia: Secondary | ICD-10-CM | POA: Diagnosis not present

## 2013-05-20 DIAGNOSIS — L570 Actinic keratosis: Secondary | ICD-10-CM | POA: Diagnosis not present

## 2013-05-31 DIAGNOSIS — Z85828 Personal history of other malignant neoplasm of skin: Secondary | ICD-10-CM | POA: Diagnosis not present

## 2013-05-31 DIAGNOSIS — D485 Neoplasm of uncertain behavior of skin: Secondary | ICD-10-CM | POA: Diagnosis not present

## 2013-05-31 DIAGNOSIS — L570 Actinic keratosis: Secondary | ICD-10-CM | POA: Diagnosis not present

## 2013-05-31 DIAGNOSIS — B079 Viral wart, unspecified: Secondary | ICD-10-CM | POA: Diagnosis not present

## 2013-06-03 DIAGNOSIS — L570 Actinic keratosis: Secondary | ICD-10-CM | POA: Diagnosis not present

## 2013-06-14 DIAGNOSIS — L570 Actinic keratosis: Secondary | ICD-10-CM | POA: Diagnosis not present

## 2013-07-06 DIAGNOSIS — L570 Actinic keratosis: Secondary | ICD-10-CM | POA: Diagnosis not present

## 2013-07-23 DIAGNOSIS — L570 Actinic keratosis: Secondary | ICD-10-CM | POA: Diagnosis not present

## 2013-09-07 DIAGNOSIS — C44211 Basal cell carcinoma of skin of unspecified ear and external auricular canal: Secondary | ICD-10-CM | POA: Diagnosis not present

## 2013-09-07 DIAGNOSIS — D485 Neoplasm of uncertain behavior of skin: Secondary | ICD-10-CM | POA: Diagnosis not present

## 2013-09-30 ENCOUNTER — Other Ambulatory Visit: Payer: Self-pay | Admitting: Dermatology

## 2013-09-30 DIAGNOSIS — D044 Carcinoma in situ of skin of scalp and neck: Secondary | ICD-10-CM | POA: Diagnosis not present

## 2013-09-30 DIAGNOSIS — D239 Other benign neoplasm of skin, unspecified: Secondary | ICD-10-CM | POA: Diagnosis not present

## 2013-09-30 DIAGNOSIS — Z85828 Personal history of other malignant neoplasm of skin: Secondary | ICD-10-CM | POA: Diagnosis not present

## 2013-09-30 DIAGNOSIS — L719 Rosacea, unspecified: Secondary | ICD-10-CM | POA: Diagnosis not present

## 2013-09-30 DIAGNOSIS — L723 Sebaceous cyst: Secondary | ICD-10-CM | POA: Diagnosis not present

## 2013-09-30 DIAGNOSIS — L570 Actinic keratosis: Secondary | ICD-10-CM | POA: Diagnosis not present

## 2013-09-30 DIAGNOSIS — D485 Neoplasm of uncertain behavior of skin: Secondary | ICD-10-CM | POA: Diagnosis not present

## 2013-10-14 DIAGNOSIS — L719 Rosacea, unspecified: Secondary | ICD-10-CM | POA: Diagnosis not present

## 2013-10-14 DIAGNOSIS — D044 Carcinoma in situ of skin of scalp and neck: Secondary | ICD-10-CM | POA: Diagnosis not present

## 2013-10-14 DIAGNOSIS — C44319 Basal cell carcinoma of skin of other parts of face: Secondary | ICD-10-CM | POA: Diagnosis not present

## 2013-11-11 DIAGNOSIS — L570 Actinic keratosis: Secondary | ICD-10-CM | POA: Diagnosis not present

## 2013-11-12 DIAGNOSIS — N529 Male erectile dysfunction, unspecified: Secondary | ICD-10-CM | POA: Diagnosis not present

## 2013-11-12 DIAGNOSIS — E291 Testicular hypofunction: Secondary | ICD-10-CM | POA: Diagnosis not present

## 2013-11-12 DIAGNOSIS — N400 Enlarged prostate without lower urinary tract symptoms: Secondary | ICD-10-CM | POA: Diagnosis not present

## 2013-11-29 DIAGNOSIS — H524 Presbyopia: Secondary | ICD-10-CM | POA: Diagnosis not present

## 2013-11-29 DIAGNOSIS — H2510 Age-related nuclear cataract, unspecified eye: Secondary | ICD-10-CM | POA: Diagnosis not present

## 2013-11-29 DIAGNOSIS — H5200 Hypermetropia, unspecified eye: Secondary | ICD-10-CM | POA: Diagnosis not present

## 2013-12-14 DIAGNOSIS — L570 Actinic keratosis: Secondary | ICD-10-CM | POA: Diagnosis not present

## 2013-12-21 DIAGNOSIS — E785 Hyperlipidemia, unspecified: Secondary | ICD-10-CM | POA: Diagnosis not present

## 2013-12-21 DIAGNOSIS — Z79899 Other long term (current) drug therapy: Secondary | ICD-10-CM | POA: Diagnosis not present

## 2014-01-04 DIAGNOSIS — E291 Testicular hypofunction: Secondary | ICD-10-CM | POA: Diagnosis not present

## 2014-01-06 DIAGNOSIS — L570 Actinic keratosis: Secondary | ICD-10-CM | POA: Diagnosis not present

## 2014-02-17 DIAGNOSIS — E291 Testicular hypofunction: Secondary | ICD-10-CM | POA: Diagnosis not present

## 2014-04-18 DIAGNOSIS — Z23 Encounter for immunization: Secondary | ICD-10-CM | POA: Diagnosis not present

## 2014-05-27 DIAGNOSIS — E291 Testicular hypofunction: Secondary | ICD-10-CM | POA: Diagnosis not present

## 2014-05-27 DIAGNOSIS — N400 Enlarged prostate without lower urinary tract symptoms: Secondary | ICD-10-CM | POA: Diagnosis not present

## 2014-05-31 DIAGNOSIS — Z008 Encounter for other general examination: Secondary | ICD-10-CM | POA: Diagnosis not present

## 2014-05-31 DIAGNOSIS — Z125 Encounter for screening for malignant neoplasm of prostate: Secondary | ICD-10-CM | POA: Diagnosis not present

## 2014-05-31 DIAGNOSIS — I10 Essential (primary) hypertension: Secondary | ICD-10-CM | POA: Diagnosis not present

## 2014-05-31 DIAGNOSIS — E785 Hyperlipidemia, unspecified: Secondary | ICD-10-CM | POA: Diagnosis not present

## 2014-06-01 DIAGNOSIS — E291 Testicular hypofunction: Secondary | ICD-10-CM | POA: Diagnosis not present

## 2014-06-01 DIAGNOSIS — R6882 Decreased libido: Secondary | ICD-10-CM | POA: Diagnosis not present

## 2014-06-01 DIAGNOSIS — N5201 Erectile dysfunction due to arterial insufficiency: Secondary | ICD-10-CM | POA: Diagnosis not present

## 2014-06-07 ENCOUNTER — Encounter: Payer: Self-pay | Admitting: Internal Medicine

## 2014-06-07 DIAGNOSIS — R918 Other nonspecific abnormal finding of lung field: Secondary | ICD-10-CM | POA: Diagnosis not present

## 2014-06-07 DIAGNOSIS — R030 Elevated blood-pressure reading, without diagnosis of hypertension: Secondary | ICD-10-CM | POA: Diagnosis not present

## 2014-06-07 DIAGNOSIS — Z0000 Encounter for general adult medical examination without abnormal findings: Secondary | ICD-10-CM | POA: Diagnosis not present

## 2014-06-07 DIAGNOSIS — H9190 Unspecified hearing loss, unspecified ear: Secondary | ICD-10-CM | POA: Diagnosis not present

## 2014-06-07 DIAGNOSIS — E785 Hyperlipidemia, unspecified: Secondary | ICD-10-CM | POA: Diagnosis not present

## 2014-06-07 DIAGNOSIS — K649 Unspecified hemorrhoids: Secondary | ICD-10-CM | POA: Diagnosis not present

## 2014-06-07 DIAGNOSIS — R011 Cardiac murmur, unspecified: Secondary | ICD-10-CM | POA: Diagnosis not present

## 2014-06-07 DIAGNOSIS — Z1389 Encounter for screening for other disorder: Secondary | ICD-10-CM | POA: Diagnosis not present

## 2014-06-07 DIAGNOSIS — R413 Other amnesia: Secondary | ICD-10-CM | POA: Diagnosis not present

## 2014-06-08 DIAGNOSIS — Z1212 Encounter for screening for malignant neoplasm of rectum: Secondary | ICD-10-CM | POA: Diagnosis not present

## 2014-08-04 ENCOUNTER — Other Ambulatory Visit: Payer: Self-pay | Admitting: Dermatology

## 2014-08-04 ENCOUNTER — Ambulatory Visit (AMBULATORY_SURGERY_CENTER): Payer: Self-pay | Admitting: *Deleted

## 2014-08-04 VITALS — Ht 70.0 in | Wt 187.2 lb

## 2014-08-04 DIAGNOSIS — D045 Carcinoma in situ of skin of trunk: Secondary | ICD-10-CM | POA: Diagnosis not present

## 2014-08-04 DIAGNOSIS — L570 Actinic keratosis: Secondary | ICD-10-CM | POA: Diagnosis not present

## 2014-08-04 DIAGNOSIS — D485 Neoplasm of uncertain behavior of skin: Secondary | ICD-10-CM | POA: Diagnosis not present

## 2014-08-04 DIAGNOSIS — Z1211 Encounter for screening for malignant neoplasm of colon: Secondary | ICD-10-CM

## 2014-08-04 DIAGNOSIS — D225 Melanocytic nevi of trunk: Secondary | ICD-10-CM | POA: Diagnosis not present

## 2014-08-04 DIAGNOSIS — L723 Sebaceous cyst: Secondary | ICD-10-CM | POA: Diagnosis not present

## 2014-08-04 DIAGNOSIS — L719 Rosacea, unspecified: Secondary | ICD-10-CM | POA: Diagnosis not present

## 2014-08-04 DIAGNOSIS — Z85828 Personal history of other malignant neoplasm of skin: Secondary | ICD-10-CM | POA: Diagnosis not present

## 2014-08-04 NOTE — Progress Notes (Signed)
No egg or soy allergy. ewm No diet pills, no home 02 use. ewm No issues with past sedation. ewm emmi to e mail. ewm Pt asked for info for a care partner, gave appointment mate info to pt. ewm

## 2014-08-05 DIAGNOSIS — E291 Testicular hypofunction: Secondary | ICD-10-CM | POA: Diagnosis not present

## 2014-08-18 ENCOUNTER — Encounter: Payer: Self-pay | Admitting: Internal Medicine

## 2014-08-18 ENCOUNTER — Ambulatory Visit (AMBULATORY_SURGERY_CENTER): Payer: Medicare Other | Admitting: Internal Medicine

## 2014-08-18 VITALS — BP 149/64 | HR 60 | Temp 96.6°F | Resp 19 | Ht 67.0 in | Wt 202.0 lb

## 2014-08-18 DIAGNOSIS — K5730 Diverticulosis of large intestine without perforation or abscess without bleeding: Secondary | ICD-10-CM

## 2014-08-18 DIAGNOSIS — Z1211 Encounter for screening for malignant neoplasm of colon: Secondary | ICD-10-CM | POA: Diagnosis not present

## 2014-08-18 DIAGNOSIS — I10 Essential (primary) hypertension: Secondary | ICD-10-CM | POA: Diagnosis not present

## 2014-08-18 DIAGNOSIS — G4730 Sleep apnea, unspecified: Secondary | ICD-10-CM | POA: Diagnosis not present

## 2014-08-18 MED ORDER — SODIUM CHLORIDE 0.9 % IV SOLN: 500.0000 mL | INTRAVENOUS | Status: DC

## 2014-08-18 NOTE — Progress Notes (Signed)
A/ox3, pleased with MAC, report to RN 

## 2014-08-18 NOTE — Progress Notes (Signed)
Dr. Carlean Purl needed to leave and see a patient in his office.

## 2014-08-18 NOTE — Op Note (Signed)
[facility]  [address] [place], [id number]   COLONOSCOPY PROCEDURE REPORT  PATIENT: Juan Hudson, Juan Hudson  MR#: [id number] BIRTHDATE: 06/25/1946 , 31  yrs. old GENDER: male ENDOSCOPIST: Gatha Mayer, MD, [facility] PROCEDURE DATE:  08/18/2014 PROCEDURE:   Colonoscopy, screening First Screening Colonoscopy - Avg.  risk and is 50 yrs.  old or older - No.  Prior Negative Screening - Now for repeat screening. 10 or more years since last screening  History of Adenoma - Now for follow-up colonoscopy & has been > or = to 3 yrs.  N/A  Polyps Removed Today? No.  Polyps Removed Today? No.  Recommend repeat exam, <10 yrs? Polyps Removed Today? No.  Recommend repeat exam, <10 yrs? No. ASA CLASS:   Class II INDICATIONS:average risk for colorectal cancer. MEDICATIONS: Propofol 400 mg IV and Monitored anesthesia care  DESCRIPTION OF PROCEDURE:   After the risks benefits and alternatives of the procedure were thoroughly explained, informed consent was obtained.  The digital rectal exam revealed no abnormalities of the rectum, revealed no prostatic nodules, and revealed the prostate was not enlarged.   The LB [id number] endoscope was introduced through the anus and advanced to the cecum, which was identified by both the appendix and ileocecal valve. No adverse events experienced.   The quality of the prep was excellent, using MiraLax  The instrument was then slowly withdrawn as the colon was fully examined.   COLON FINDINGS: There was moderate diverticulosis noted in the left colon.   The examination was otherwise normal.  Retroflexed views revealed internal hemorrhoids. The time to cecum=3 minutes 04 seconds.  Withdrawal time=12 minutes 56 seconds.  The scope was withdrawn and the procedure completed. COMPLICATIONS: There were no immediate complications.  ENDOSCOPIC IMPRESSION: 1.   Moderate diverticulosis was noted in the left colon 2.   The colon examination was  otherwise normal - excellent prep 3.   Internal hemorrhoids in rectum  RECOMMENDATIONS: 1) Return as needed - would not recommend a routine repeat exam in 10 years as will be 78 - defer to patient and PCP then 2) Can band hemorrhoids if he is having symptoms and desires  eSigned:  Gatha Mayer, MD, [facility] 08/18/2014 9:56 AM   cc: Crist Infante, MD and The Patient

## 2014-08-18 NOTE — Patient Instructions (Addendum)
No polyps again! You do have a condition called diverticulosis - common and not usually a problem. Please read the handout provided. I also saw some internal hemorrhoids. If you have hemorrhoid problems (swelling, itching, bleeding) I am able to treat those with an in-office procedure. If you like, please call my office at [phone] to schedule an appointment and I can evaluate you further.  I do not recommend repeating a routine colonoscopy at your age as you would be 43 next time its due. I will certainly be available to help you if you have gastrointestinal problems, though.  I appreciate the opportunity to care for you. Gatha Mayer, MD, FACG  YOU HAD AN ENDOSCOPIC PROCEDURE TODAY AT [facility]: Refer to the procedure report that was given to you for any specific questions about what was found during the examination.  If the procedure report does not answer your questions, please call your gastroenterologist to clarify.  If you requested that your care partner not be given the details of your procedure findings, then the procedure report has been included in a sealed envelope for you to review at your convenience later.  YOU SHOULD EXPECT: Some feelings of bloating in the abdomen. Passage of more gas than usual.  Walking can help get rid of the air that was put into your GI tract during the procedure and reduce the bloating. If you had a lower endoscopy (such as a colonoscopy or flexible sigmoidoscopy) you may notice spotting of blood in your stool or on the toilet paper. If you underwent a bowel prep for your procedure, then you may not have a normal bowel movement for a few days.  DIET: Your first meal following the procedure should be a light meal and then it is ok to progress to your normal diet.  A half-sandwich or bowl of soup is an example of a good first meal.  Heavy or fried foods are harder to digest and may make you feel nauseous or bloated.  Likewise meals  heavy in dairy and vegetables can cause extra gas to form and this can also increase the bloating.  Drink plenty of fluids but you should avoid alcoholic beverages for 24 hours.  ACTIVITY: Your care partner should take you home directly after the procedure.  You should plan to take it easy, moving slowly for the rest of the day.  You can resume normal activity the day after the procedure however you should NOT DRIVE or use heavy machinery for 24 hours (because of the sedation medicines used during the test).    SYMPTOMS TO REPORT IMMEDIATELY: A gastroenterologist can be reached at any hour.  During normal business hours, 8:30 AM to 5:00 PM Monday through Friday, call [phone].  After hours and on weekends, please call the GI answering service at [phone] who will take a message and have the physician on call contact you.   Following lower endoscopy (colonoscopy or flexible sigmoidoscopy):  Excessive amounts of blood in the stool  Significant tenderness or worsening of abdominal pains  Swelling of the abdomen that is new, acute  Fever of 100F or higher   FOLLOW UP: If any biopsies were taken you will be contacted by phone or by letter within the next 1-3 weeks.  Call your gastroenterologist if you have not heard about the biopsies in 3 weeks.  Our staff will call the home number listed on your records the next business day following your procedure to check  on you and address any questions or concerns that you may have at that time regarding the information given to you following your procedure. This is a courtesy call and so if there is no answer at the home number and we have not heard from you through the emergency physician on call, we will assume that you have returned to your regular daily activities without incident.  SIGNATURES/CONFIDENTIALITY: You and/or your care partner have signed paperwork which will be entered into your electronic medical record.  These signatures attest  to the fact that that the information above on your After Visit Summary has been reviewed and is understood.  Full responsibility of the confidentiality of this discharge information lies with you and/or your care-partner.  Diverticulosis, hemorrhoids-handouts given

## 2014-08-19 ENCOUNTER — Telehealth: Payer: Self-pay | Admitting: *Deleted

## 2014-08-19 NOTE — Telephone Encounter (Signed)
No answer, message left for the patient. 

## 2014-09-08 DIAGNOSIS — L719 Rosacea, unspecified: Secondary | ICD-10-CM | POA: Diagnosis not present

## 2014-09-08 DIAGNOSIS — D045 Carcinoma in situ of skin of trunk: Secondary | ICD-10-CM | POA: Diagnosis not present

## 2014-11-15 DIAGNOSIS — L570 Actinic keratosis: Secondary | ICD-10-CM | POA: Diagnosis not present

## 2015-02-07 ENCOUNTER — Ambulatory Visit (INDEPENDENT_AMBULATORY_CARE_PROVIDER_SITE_OTHER): Payer: Medicare Other | Admitting: Sports Medicine

## 2015-02-07 ENCOUNTER — Encounter: Payer: Self-pay | Admitting: Sports Medicine

## 2015-02-07 VITALS — BP 128/82 | Ht 70.0 in | Wt 183.0 lb

## 2015-02-07 DIAGNOSIS — M533 Sacrococcygeal disorders, not elsewhere classified: Secondary | ICD-10-CM

## 2015-02-07 DIAGNOSIS — M7741 Metatarsalgia, right foot: Secondary | ICD-10-CM

## 2015-02-07 NOTE — Progress Notes (Signed)
   Subjective:    Patient ID: Juan Hudson, male    DOB: Sep 23, 1945, 69 y.o.   MRN: [id number]  HPI chief complaint: "I need new orthotics"  Very pleasant 69 year old male comes in today for new orthotics. Current custom orthotics are several years old. He has a history of metatarsalgia as well as hammertoe deformity. His orthotics are comfortable but are beginning to wear out. He is also complaining of some posterior right hip pain with occasional radiating pain down the lateral aspect of the right upper leg. Pain began about 6 weeks ago after he was doing some sprints. He denies groin pain. No numbness or tingling.     Review of Systems    as above Objective:   Physical Exam Well-developed, well-nourished. No acute distress  Right hip: Smooth painless hip range of motion with a negative logroll. Slight tenderness to palpation over the greater trochanteric bursa but not marked. There is also tenderness to palpation over the right SI joint with a positive Faber's. No pain with resisted hip flexion. Negative straight leg raise. No focal neurological deficits of either lower extremity.  Examination of each foot shows transverse arch collapse bilaterally. There is a hammertoe deformity of the left second toe. Mild overlying callus at the level of the second and third metatarsal heads bilaterally. Neurovascularly intact distally.       Assessment & Plan:  History of metatarsalgia SI joint dysfunction  New orthotics were constructed today. Patient found these comfortable prior to leaving the office. I've instructed him in some SI joint exercises. If symptoms persist consider formal physical therapy. Follow-up for ongoing or recalcitrant issues.  Total time spent with the patient was 40 minutes with greater than 50% of the time spent in face-to-face consultation discussing orthotic construction, instruction, and fitting as well as discussing his right SI joint dysfunction.  Patient was  fitted for a : standard, cushioned, semi-rigid orthotic. The orthotic was heated and afterward the patient stood on the orthotic blank positioned on the orthotic stand. The patient was positioned in subtalar neutral position and 10 degrees of ankle dorsiflexion in a weight bearing stance. After completion of molding, a stable base was applied to the orthotic blank. The blank was ground to a stable position for weight bearing. Size: 11 Base: Blue EVA Posting: none Additional orthotic padding:medium MT pad on Rt, small MT pad on left  (I replaced the right metatarsal cookie on his previous orthotic with a smaller metatarsal pad today. We could change this back to the metatarsal cookie if his metatarsalgia returns).

## 2015-03-21 DIAGNOSIS — L821 Other seborrheic keratosis: Secondary | ICD-10-CM | POA: Diagnosis not present

## 2015-03-21 DIAGNOSIS — D225 Melanocytic nevi of trunk: Secondary | ICD-10-CM | POA: Diagnosis not present

## 2015-03-21 DIAGNOSIS — D0462 Carcinoma in situ of skin of left upper limb, including shoulder: Secondary | ICD-10-CM | POA: Diagnosis not present

## 2015-03-21 DIAGNOSIS — Z85828 Personal history of other malignant neoplasm of skin: Secondary | ICD-10-CM | POA: Diagnosis not present

## 2015-03-21 DIAGNOSIS — L719 Rosacea, unspecified: Secondary | ICD-10-CM | POA: Diagnosis not present

## 2015-03-21 DIAGNOSIS — L570 Actinic keratosis: Secondary | ICD-10-CM | POA: Diagnosis not present

## 2015-03-21 DIAGNOSIS — D485 Neoplasm of uncertain behavior of skin: Secondary | ICD-10-CM | POA: Diagnosis not present

## 2015-04-13 DIAGNOSIS — S90212A Contusion of left great toe with damage to nail, initial encounter: Secondary | ICD-10-CM | POA: Diagnosis not present

## 2015-04-13 DIAGNOSIS — D0462 Carcinoma in situ of skin of left upper limb, including shoulder: Secondary | ICD-10-CM | POA: Diagnosis not present

## 2015-04-13 DIAGNOSIS — L719 Rosacea, unspecified: Secondary | ICD-10-CM | POA: Diagnosis not present

## 2015-06-09 DIAGNOSIS — R6882 Decreased libido: Secondary | ICD-10-CM | POA: Diagnosis not present

## 2015-06-09 DIAGNOSIS — N400 Enlarged prostate without lower urinary tract symptoms: Secondary | ICD-10-CM | POA: Diagnosis not present

## 2015-06-09 DIAGNOSIS — E291 Testicular hypofunction: Secondary | ICD-10-CM | POA: Diagnosis not present

## 2015-06-13 DIAGNOSIS — E785 Hyperlipidemia, unspecified: Secondary | ICD-10-CM | POA: Diagnosis not present

## 2015-06-13 DIAGNOSIS — I2510 Atherosclerotic heart disease of native coronary artery without angina pectoris: Secondary | ICD-10-CM | POA: Diagnosis not present

## 2015-06-13 DIAGNOSIS — Z0000 Encounter for general adult medical examination without abnormal findings: Secondary | ICD-10-CM | POA: Diagnosis not present

## 2015-06-13 DIAGNOSIS — I10 Essential (primary) hypertension: Secondary | ICD-10-CM | POA: Diagnosis not present

## 2015-06-13 DIAGNOSIS — Z125 Encounter for screening for malignant neoplasm of prostate: Secondary | ICD-10-CM | POA: Diagnosis not present

## 2015-06-20 DIAGNOSIS — K649 Unspecified hemorrhoids: Secondary | ICD-10-CM | POA: Diagnosis not present

## 2015-06-20 DIAGNOSIS — R030 Elevated blood-pressure reading, without diagnosis of hypertension: Secondary | ICD-10-CM | POA: Diagnosis not present

## 2015-06-20 DIAGNOSIS — R413 Other amnesia: Secondary | ICD-10-CM | POA: Diagnosis not present

## 2015-06-20 DIAGNOSIS — Z0000 Encounter for general adult medical examination without abnormal findings: Secondary | ICD-10-CM | POA: Diagnosis not present

## 2015-06-20 DIAGNOSIS — H9190 Unspecified hearing loss, unspecified ear: Secondary | ICD-10-CM | POA: Diagnosis not present

## 2015-06-20 DIAGNOSIS — E291 Testicular hypofunction: Secondary | ICD-10-CM | POA: Diagnosis not present

## 2015-06-20 DIAGNOSIS — R011 Cardiac murmur, unspecified: Secondary | ICD-10-CM | POA: Diagnosis not present

## 2015-06-20 DIAGNOSIS — I2510 Atherosclerotic heart disease of native coronary artery without angina pectoris: Secondary | ICD-10-CM | POA: Diagnosis not present

## 2015-06-20 DIAGNOSIS — R918 Other nonspecific abnormal finding of lung field: Secondary | ICD-10-CM | POA: Diagnosis not present

## 2015-06-20 DIAGNOSIS — Z1389 Encounter for screening for other disorder: Secondary | ICD-10-CM | POA: Diagnosis not present

## 2015-06-20 DIAGNOSIS — L719 Rosacea, unspecified: Secondary | ICD-10-CM | POA: Diagnosis not present

## 2015-06-20 DIAGNOSIS — E785 Hyperlipidemia, unspecified: Secondary | ICD-10-CM | POA: Diagnosis not present

## 2015-07-04 DIAGNOSIS — H43813 Vitreous degeneration, bilateral: Secondary | ICD-10-CM | POA: Diagnosis not present

## 2015-07-04 DIAGNOSIS — H2513 Age-related nuclear cataract, bilateral: Secondary | ICD-10-CM | POA: Diagnosis not present

## 2015-07-04 DIAGNOSIS — H524 Presbyopia: Secondary | ICD-10-CM | POA: Diagnosis not present

## 2015-09-27 ENCOUNTER — Telehealth: Payer: Self-pay | Admitting: Internal Medicine

## 2015-09-27 NOTE — Telephone Encounter (Signed)
Called spoke with pt. He has not been seen since 2012. Aware will need OV. He wanted to wait and see Dr. Annamaria Boots at [facility] appt in June. appt scheduled

## 2015-10-02 ENCOUNTER — Ambulatory Visit (INDEPENDENT_AMBULATORY_CARE_PROVIDER_SITE_OTHER): Payer: Medicare Other | Admitting: Sports Medicine

## 2015-10-02 ENCOUNTER — Encounter: Payer: Self-pay | Admitting: Sports Medicine

## 2015-10-02 VITALS — BP 104/69 | Ht 70.0 in | Wt 185.0 lb

## 2015-10-02 DIAGNOSIS — M7741 Metatarsalgia, right foot: Secondary | ICD-10-CM

## 2015-10-02 NOTE — Progress Notes (Signed)
Patient ID: Juan Hudson, male   DOB: 1946/03/25, 70 y.o.   MRN: [id number]  Patient comes in today requesting two new pair of custom orthotics. He has a documented history of metatarsalgia. His last custom orthotics were made in July 2016. He has found them to be very comfortable but feels like they may be wearing down.  Examination of each foot shows transverse arch collapse bilaterally. Mild callus at the level of the second and third metatarsal head bilaterally. Neurovascularly intact distally.  2 new pairs of custom orthotics were constructed today. Just as we did previously, we added a metatarsal cookie to the right orthotic and a small metatarsal pad to the left orthotic. He found the orthotics to be comfortable prior to leaving the office. Gait was neutralized with orthotics in place. Total time spent with the patient was 1 hour with greater than 50% of the time spent in face-to-face consultation discussing new orthotic construction, instruction, and fitting. Patient will follow-up as needed.  Patient was fitted for a : standard, cushioned, semi-rigid orthotic.* The orthotic was heated and afterward the patient stood on the orthotic blank positioned on the orthotic stand. The patient was positioned in subtalar neutral position and 10 degrees of ankle dorsiflexion in a weight bearing stance. After completion of molding, a stable base was applied to the orthotic blank. The blank was ground to a stable position for weight bearing. Size: 11 Base: Blue EVA Posting: none Additional orthotic padding: Right metatarsal cookie, left small MT pad  * Two identical pairs were constructed.

## 2015-10-12 ENCOUNTER — Encounter: Payer: Self-pay | Admitting: Pulmonary Disease

## 2015-10-12 ENCOUNTER — Ambulatory Visit (INDEPENDENT_AMBULATORY_CARE_PROVIDER_SITE_OTHER): Payer: Medicare Other | Admitting: Pulmonary Disease

## 2015-10-12 VITALS — BP 148/86 | HR 59 | Ht 69.0 in | Wt 194.5 lb

## 2015-10-12 DIAGNOSIS — G4710 Hypersomnia, unspecified: Secondary | ICD-10-CM | POA: Diagnosis not present

## 2015-10-12 DIAGNOSIS — G4733 Obstructive sleep apnea (adult) (pediatric): Secondary | ICD-10-CM | POA: Diagnosis not present

## 2015-10-12 NOTE — Patient Instructions (Signed)
1. We will order you an auto CPAP machine 5-15 cm water for June 2017. 2. We will get a 1 month download.   Return to clinic in 4 mos
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
## 2015-10-12 NOTE — Assessment & Plan Note (Signed)
Recent hypersomnia with his old CPAP machine. Plan to get a new CPAP machine in June 2017.

## 2015-10-12 NOTE — Addendum Note (Signed)
Addended by: Beckie Busing on: 10/12/2015 05:09 PM   Modules accepted: Orders

## 2015-10-13 DIAGNOSIS — D225 Melanocytic nevi of trunk: Secondary | ICD-10-CM | POA: Diagnosis not present

## 2015-10-13 DIAGNOSIS — L111 Transient acantholytic dermatosis [Grover]: Secondary | ICD-10-CM | POA: Diagnosis not present

## 2015-10-13 DIAGNOSIS — L821 Other seborrheic keratosis: Secondary | ICD-10-CM | POA: Diagnosis not present

## 2015-10-13 DIAGNOSIS — Z85828 Personal history of other malignant neoplasm of skin: Secondary | ICD-10-CM | POA: Diagnosis not present

## 2015-10-13 DIAGNOSIS — L719 Rosacea, unspecified: Secondary | ICD-10-CM | POA: Diagnosis not present

## 2015-10-13 DIAGNOSIS — L570 Actinic keratosis: Secondary | ICD-10-CM | POA: Diagnosis not present

## 2015-12-25 ENCOUNTER — Telehealth: Payer: Self-pay | Admitting: Pulmonary Disease

## 2015-12-25 NOTE — Telephone Encounter (Signed)
Pt states that he has purchased a CPAP instead of going through a DME = Pt states that he has been using this machine x 30 days.  Aware to keep his appt for 12/26/15 with AD - pt walked through where the card is located in the machine and states that he does not see a card.  Pt states that he is going to bring this machine by the office for [place] to look and see if there is card. Pt does not want to wait until his appt 12/26/15 just in case there is no card and no data.  Will hold in triage until pt brings machine by.

## 2015-12-26 ENCOUNTER — Ambulatory Visit (INDEPENDENT_AMBULATORY_CARE_PROVIDER_SITE_OTHER): Payer: Medicare Other | Admitting: Pulmonary Disease

## 2015-12-26 ENCOUNTER — Encounter: Payer: Self-pay | Admitting: Pulmonary Disease

## 2015-12-26 VITALS — BP 112/58 | HR 75 | Ht 69.0 in | Wt 189.0 lb

## 2015-12-26 DIAGNOSIS — G4733 Obstructive sleep apnea (adult) (pediatric): Secondary | ICD-10-CM | POA: Diagnosis not present

## 2015-12-26 DIAGNOSIS — G4710 Hypersomnia, unspecified: Secondary | ICD-10-CM
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
## 2015-12-26 NOTE — Assessment & Plan Note (Signed)
Recent hypersomnia with his old CPAP machine. Hypersomnia is better with new cpap.  Cont cpap.

## 2015-12-26 NOTE — Patient Instructions (Signed)
1. Continue using autocpap 5-15 cm water 2.  Call us back if you are having issues with it.  Return to clinic in 1 yr.

## 2016-01-01 NOTE — Telephone Encounter (Signed)
lmtcb x1 for pt. 

## 2016-01-02 NOTE — Telephone Encounter (Signed)
Pt has already been seen for follow up. Nothing further needed.

## 2016-01-04 ENCOUNTER — Institutional Professional Consult (permissible substitution): Payer: Medicare Other | Admitting: Internal Medicine

## 2016-01-15 ENCOUNTER — Encounter: Payer: Self-pay | Admitting: Pulmonary Disease

## 2016-03-28 ENCOUNTER — Telehealth: Payer: Self-pay

## 2016-03-28 DIAGNOSIS — G4733 Obstructive sleep apnea (adult) (pediatric): Secondary | ICD-10-CM

## 2016-03-28 NOTE — Telephone Encounter (Signed)
Spoke with pt, requesting a new rx for a cpap mask.  Pt uses [facility].

## 2016-03-28 NOTE — Telephone Encounter (Signed)
LVM for pt to return call

## 2016-03-28 NOTE — Telephone Encounter (Signed)
Pt called back, states he has an appt at 3:30 with [facility]- would like this order placed by then if possible.  AD ok to place order for cpap mask?  Thanks!

## 2016-03-28 NOTE — Telephone Encounter (Signed)
Yes. Ok to place. thanks

## 2016-03-29 ENCOUNTER — Telehealth: Payer: Self-pay | Admitting: Pulmonary Disease

## 2016-03-29 NOTE — Telephone Encounter (Signed)
Called [facility] and they are needing the order signed by Dr. Corrie Dandy in [facility]. Please advise thanks

## 2016-04-02 NOTE — Telephone Encounter (Signed)
   I dont see any unsigned orders. Did you send it to me now?  J. Shirl Harris, MD 04/02/2016, 2:27 PM [facility] Pager [phone] After 3 pm or if no answer, call [phone]

## 2016-04-02 NOTE — Telephone Encounter (Signed)
AD did you happen to sign this order in epic?  thanks

## 2016-04-02 NOTE — Telephone Encounter (Signed)
Spoke with Amy @ [facility] and she states that order has been taken care of and shipment of supplies is going out to pt today. Nothing further needed.  AD - Juluis Rainier

## 2016-04-06 DIAGNOSIS — Z23 Encounter for immunization: Secondary | ICD-10-CM | POA: Diagnosis not present

## 2016-04-09 DIAGNOSIS — L570 Actinic keratosis: Secondary | ICD-10-CM | POA: Diagnosis not present

## 2016-04-09 DIAGNOSIS — C44629 Squamous cell carcinoma of skin of left upper limb, including shoulder: Secondary | ICD-10-CM | POA: Diagnosis not present

## 2016-04-09 DIAGNOSIS — Z85828 Personal history of other malignant neoplasm of skin: Secondary | ICD-10-CM | POA: Diagnosis not present

## 2016-04-09 DIAGNOSIS — L821 Other seborrheic keratosis: Secondary | ICD-10-CM | POA: Diagnosis not present

## 2016-04-09 DIAGNOSIS — D225 Melanocytic nevi of trunk: Secondary | ICD-10-CM | POA: Diagnosis not present

## 2016-04-09 DIAGNOSIS — D485 Neoplasm of uncertain behavior of skin: Secondary | ICD-10-CM | POA: Diagnosis not present

## 2016-04-09 DIAGNOSIS — Z23 Encounter for immunization: Secondary | ICD-10-CM | POA: Diagnosis not present

## 2016-04-18 DIAGNOSIS — C44629 Squamous cell carcinoma of skin of left upper limb, including shoulder: Secondary | ICD-10-CM | POA: Diagnosis not present

## 2016-05-24 DIAGNOSIS — E291 Testicular hypofunction: Secondary | ICD-10-CM | POA: Diagnosis not present

## 2016-05-24 DIAGNOSIS — Z125 Encounter for screening for malignant neoplasm of prostate: Secondary | ICD-10-CM | POA: Diagnosis not present

## 2016-06-10 DIAGNOSIS — N5201 Erectile dysfunction due to arterial insufficiency: Secondary | ICD-10-CM | POA: Diagnosis not present

## 2016-06-10 DIAGNOSIS — E291 Testicular hypofunction: Secondary | ICD-10-CM | POA: Diagnosis not present

## 2016-06-25 DIAGNOSIS — D485 Neoplasm of uncertain behavior of skin: Secondary | ICD-10-CM | POA: Diagnosis not present

## 2016-06-25 DIAGNOSIS — C44529 Squamous cell carcinoma of skin of other part of trunk: Secondary | ICD-10-CM | POA: Diagnosis not present

## 2016-06-25 DIAGNOSIS — L570 Actinic keratosis: Secondary | ICD-10-CM | POA: Diagnosis not present

## 2016-06-27 DIAGNOSIS — Z125 Encounter for screening for malignant neoplasm of prostate: Secondary | ICD-10-CM | POA: Diagnosis not present

## 2016-06-27 DIAGNOSIS — E784 Other hyperlipidemia: Secondary | ICD-10-CM | POA: Diagnosis not present

## 2016-06-27 DIAGNOSIS — I10 Essential (primary) hypertension: Secondary | ICD-10-CM | POA: Diagnosis not present

## 2016-07-02 DIAGNOSIS — H43813 Vitreous degeneration, bilateral: Secondary | ICD-10-CM | POA: Diagnosis not present

## 2016-07-02 DIAGNOSIS — H524 Presbyopia: Secondary | ICD-10-CM | POA: Diagnosis not present

## 2016-07-02 DIAGNOSIS — H2513 Age-related nuclear cataract, bilateral: Secondary | ICD-10-CM | POA: Diagnosis not present

## 2016-07-02 DIAGNOSIS — H5203 Hypermetropia, bilateral: Secondary | ICD-10-CM | POA: Diagnosis not present

## 2016-07-04 DIAGNOSIS — R011 Cardiac murmur, unspecified: Secondary | ICD-10-CM | POA: Diagnosis not present

## 2016-07-04 DIAGNOSIS — Z0000 Encounter for general adult medical examination without abnormal findings: Secondary | ICD-10-CM | POA: Diagnosis not present

## 2016-07-04 DIAGNOSIS — Z6827 Body mass index (BMI) 27.0-27.9, adult: Secondary | ICD-10-CM | POA: Diagnosis not present

## 2016-07-04 DIAGNOSIS — K649 Unspecified hemorrhoids: Secondary | ICD-10-CM | POA: Diagnosis not present

## 2016-07-04 DIAGNOSIS — R2689 Other abnormalities of gait and mobility: Secondary | ICD-10-CM | POA: Diagnosis not present

## 2016-07-04 DIAGNOSIS — R413 Other amnesia: Secondary | ICD-10-CM | POA: Diagnosis not present

## 2016-07-04 DIAGNOSIS — E784 Other hyperlipidemia: Secondary | ICD-10-CM | POA: Diagnosis not present

## 2016-07-04 DIAGNOSIS — Z1389 Encounter for screening for other disorder: Secondary | ICD-10-CM | POA: Diagnosis not present

## 2016-07-04 DIAGNOSIS — L718 Other rosacea: Secondary | ICD-10-CM | POA: Diagnosis not present

## 2016-07-04 DIAGNOSIS — I2510 Atherosclerotic heart disease of native coronary artery without angina pectoris: Secondary | ICD-10-CM | POA: Diagnosis not present

## 2016-07-04 DIAGNOSIS — H9193 Unspecified hearing loss, bilateral: Secondary | ICD-10-CM | POA: Diagnosis not present

## 2016-07-04 DIAGNOSIS — I10 Essential (primary) hypertension: Secondary | ICD-10-CM | POA: Diagnosis not present

## 2016-07-05 ENCOUNTER — Other Ambulatory Visit: Payer: Self-pay | Admitting: Internal Medicine

## 2016-07-05 DIAGNOSIS — R011 Cardiac murmur, unspecified: Secondary | ICD-10-CM

## 2016-07-08 DIAGNOSIS — Z1212 Encounter for screening for malignant neoplasm of rectum: Secondary | ICD-10-CM | POA: Diagnosis not present

## 2016-07-11 DIAGNOSIS — C44529 Squamous cell carcinoma of skin of other part of trunk: Secondary | ICD-10-CM | POA: Diagnosis not present

## 2016-07-11 DIAGNOSIS — L988 Other specified disorders of the skin and subcutaneous tissue: Secondary | ICD-10-CM | POA: Diagnosis not present

## 2016-07-25 ENCOUNTER — Other Ambulatory Visit: Payer: Self-pay

## 2016-07-25 ENCOUNTER — Ambulatory Visit (HOSPITAL_COMMUNITY): Payer: PPO | Attending: Cardiovascular Disease

## 2016-07-25 DIAGNOSIS — R011 Cardiac murmur, unspecified: Secondary | ICD-10-CM | POA: Diagnosis not present

## 2016-08-13 DIAGNOSIS — Z111 Encounter for screening for respiratory tuberculosis: Secondary | ICD-10-CM | POA: Diagnosis not present

## 2016-10-11 DIAGNOSIS — L570 Actinic keratosis: Secondary | ICD-10-CM | POA: Diagnosis not present

## 2016-10-11 DIAGNOSIS — L111 Transient acantholytic dermatosis [Grover]: Secondary | ICD-10-CM | POA: Diagnosis not present

## 2016-10-11 DIAGNOSIS — Z85828 Personal history of other malignant neoplasm of skin: Secondary | ICD-10-CM | POA: Diagnosis not present

## 2016-10-11 DIAGNOSIS — D225 Melanocytic nevi of trunk: Secondary | ICD-10-CM | POA: Diagnosis not present

## 2017-01-06 ENCOUNTER — Ambulatory Visit: Payer: Self-pay | Admitting: Pulmonary Disease

## 2017-01-23 DIAGNOSIS — Z6827 Body mass index (BMI) 27.0-27.9, adult: Secondary | ICD-10-CM | POA: Diagnosis not present

## 2017-01-23 DIAGNOSIS — M5416 Radiculopathy, lumbar region: Secondary | ICD-10-CM | POA: Diagnosis not present

## 2017-02-18 ENCOUNTER — Encounter: Payer: Self-pay | Admitting: Internal Medicine

## 2017-02-18 ENCOUNTER — Ambulatory Visit (INDEPENDENT_AMBULATORY_CARE_PROVIDER_SITE_OTHER): Payer: PPO | Admitting: Internal Medicine

## 2017-02-18 DIAGNOSIS — G4733 Obstructive sleep apnea (adult) (pediatric): Secondary | ICD-10-CM | POA: Diagnosis not present

## 2017-02-18 DIAGNOSIS — R011 Cardiac murmur, unspecified: Secondary | ICD-10-CM | POA: Diagnosis not present

## 2017-02-18 NOTE — Assessment & Plan Note (Signed)
I thought I was probably hearing aortic stenosis but by echocardiogram it's probably mitral regurgitation. I'm not sure why right atrium is dilated. He doesn't seem to have pulmonary hypertension or significant tricuspid regurgitation. He will discuss with Dr.Perini as needed.

## 2017-02-18 NOTE — Assessment & Plan Note (Signed)
We reviewed his download report and I explained compliance goals. Pressure ranges adequate. I think he is willing to try to wear his mask somewhat more. We also talked about options for mask change with his DME. He benefits from CPAP and will continue with auto 5-15.

## 2017-02-18 NOTE — Addendum Note (Signed)
Addended by: Clayborne Dana C on: 02/18/2017 10:49 AM   Modules accepted: Orders
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
## 2017-02-18 NOTE — Patient Instructions (Signed)
Order- [facility] -We can continue CPAP auto 5-15, mask of choice, humidifier, supplies, Airview  Dx OSA  Our ideal would be able to wear CPAP anytime/ all the time that you are sleeping. The usual minimal goal is to wear it for at least 4 hours on at least 70% of nights.  If it gets uncomfortable, or you have any concerns at all, please let us know.

## 2017-03-04 DIAGNOSIS — G4733 Obstructive sleep apnea (adult) (pediatric): Secondary | ICD-10-CM | POA: Diagnosis not present

## 2017-03-04 DIAGNOSIS — G4730 Sleep apnea, unspecified: Secondary | ICD-10-CM | POA: Diagnosis not present

## 2017-03-06 DIAGNOSIS — M5416 Radiculopathy, lumbar region: Secondary | ICD-10-CM | POA: Diagnosis not present

## 2017-03-06 DIAGNOSIS — D485 Neoplasm of uncertain behavior of skin: Secondary | ICD-10-CM | POA: Diagnosis not present

## 2017-03-06 DIAGNOSIS — L570 Actinic keratosis: Secondary | ICD-10-CM | POA: Diagnosis not present

## 2017-03-06 DIAGNOSIS — M545 Low back pain: Secondary | ICD-10-CM | POA: Diagnosis not present

## 2017-03-06 DIAGNOSIS — L814 Other melanin hyperpigmentation: Secondary | ICD-10-CM | POA: Diagnosis not present

## 2017-03-06 DIAGNOSIS — D045 Carcinoma in situ of skin of trunk: Secondary | ICD-10-CM | POA: Diagnosis not present

## 2017-03-11 DIAGNOSIS — M5416 Radiculopathy, lumbar region: Secondary | ICD-10-CM | POA: Diagnosis not present

## 2017-03-11 DIAGNOSIS — M545 Low back pain: Secondary | ICD-10-CM | POA: Diagnosis not present

## 2017-03-14 DIAGNOSIS — M5416 Radiculopathy, lumbar region: Secondary | ICD-10-CM | POA: Diagnosis not present

## 2017-03-14 DIAGNOSIS — M545 Low back pain: Secondary | ICD-10-CM | POA: Diagnosis not present

## 2017-03-17 DIAGNOSIS — M5416 Radiculopathy, lumbar region: Secondary | ICD-10-CM | POA: Diagnosis not present

## 2017-03-17 DIAGNOSIS — M545 Low back pain: Secondary | ICD-10-CM | POA: Diagnosis not present

## 2017-03-20 DIAGNOSIS — I8311 Varicose veins of right lower extremity with inflammation: Secondary | ICD-10-CM | POA: Diagnosis not present

## 2017-03-20 DIAGNOSIS — I8312 Varicose veins of left lower extremity with inflammation: Secondary | ICD-10-CM | POA: Diagnosis not present

## 2017-03-20 DIAGNOSIS — I83893 Varicose veins of bilateral lower extremities with other complications: Secondary | ICD-10-CM | POA: Diagnosis not present

## 2017-03-20 DIAGNOSIS — M5416 Radiculopathy, lumbar region: Secondary | ICD-10-CM | POA: Diagnosis not present

## 2017-03-20 DIAGNOSIS — M545 Low back pain: Secondary | ICD-10-CM | POA: Diagnosis not present

## 2017-03-31 DIAGNOSIS — M5416 Radiculopathy, lumbar region: Secondary | ICD-10-CM | POA: Diagnosis not present

## 2017-03-31 DIAGNOSIS — M545 Low back pain: Secondary | ICD-10-CM | POA: Diagnosis not present

## 2017-04-03 ENCOUNTER — Ambulatory Visit (INDEPENDENT_AMBULATORY_CARE_PROVIDER_SITE_OTHER): Payer: PPO | Admitting: Sports Medicine

## 2017-04-03 DIAGNOSIS — M5416 Radiculopathy, lumbar region: Secondary | ICD-10-CM

## 2017-04-03 DIAGNOSIS — M79671 Pain in right foot: Secondary | ICD-10-CM

## 2017-04-03 DIAGNOSIS — M545 Low back pain: Secondary | ICD-10-CM | POA: Diagnosis not present

## 2017-04-03 NOTE — Progress Notes (Signed)
   [facility] [address] [place], [place] [address] Phone: [phone] Fax: [phone]   Patient Name: Juan Hudson Date of Birth: 1945/08/27 Medical Record Number: [id number] Gender: male Date of Encounter: 04/03/2017  History of Present Illness:  Juan Hudson is a 71 y.o. very pleasant male patient who presents with the following:  Ongoing pain and subjective weakness in left thigh x6 months and intermittent focal pain on the medial aspect of his left foot.  Patient has history significant for right second toe with significant hammer at MTP and PIP.  He was evaluated by his PCP who recommended physical therapy. He has been going 2x week for the past several weeks and then have been taping his left ankles. Prior to development of his thigh pain he was running 2-3/week at about 5-6 mi.  He describes difficulty getting into his car due to having to step up and has pain going up stairs. Occasionally will experience some shooting pain going from his back, across the hip and down to the foot on the left side. Otherwise denies any numbness or tingling, swelling or instability.   Past Medical, Surgical, Social, and Family History Reviewed. Medications and Allergies reviewed and all updated if necessary.  Review of Systems:  Denies numbness or tingling down extremities, no neck pain or swelling  Physical Examination: Vitals:   04/03/17 1117  BP: (!) 100/58   Vitals:   04/03/17 1117  Weight: 180 lb (81.6 kg)  Height: 5\' 9"  (1.753 m)   Body mass index is 26.58 kg/m.  General: well appearing 71yo in NAD Cardiac: well perfused Resp: NWOB MSK:   Feet: transverse arch collapse bilaterally, right second toe with significant hammer at MTP and PIP.  Full AROM/PROM.  TTP on medial aspect of right foot around 1st TMT joint.  Hips: No gross deformities or edema. Full AROM/PROM.  No TTP along quadriceps, quadriceps tendon or greater trochanter.  No SI  joint tenderness.  Back: FROM. No pain elicited with trunk ROM.  No spinal or paraspinal TTP.   Neuro: grossly normal, no changes in sensation, strength 4/5 in left hip flexors, 5/5 bilateral hip abductors. 5/5 in left foot and ankle  Ultrasound: Demonstrated arthritic changes and loose body at 1st right TMT.   Assessment and Plan: Right foot pain Significant hammer toe on R second toe at MTP and PIP and loose body and arthritic changes noted on ultrasound at the TMT. Identification of loose body correlates with patient's intermittent pain.  Fitted patient for arch strap and placed scaphoid support pad on insoles. Gait was observed in hallway and patient reported significant improvement in symptoms. Follow up as needed  Left thigh weakness Patient describes shooting pain down back, left hip and down into the foot.  Hip flexor strength was 4/5 on L side and abductor strength 5/5.  No changes in sensation or atrophy. This is likely due to some nerve root compression from arthritic changes in L-spine. Provided patient with hip flexor exercises. Follow up as needed.   Daniel L. Rosalyn Gess, [place] Resident PGY-2 04/04/2017 9:15 AM  I observed and examined the patient with the resident and agree with assessment and plan.  Note reviewed and modified by me. Stefanie Libel, MD

## 2017-04-03 NOTE — Patient Instructions (Signed)
Juan Hudson, you were seen today for right foot pain and left thigh pain.  We didn't ultrasound of your right foot and found some arthritic changes in the also had a loose body that can intermittently give you some discomfort depending on your position. We provided you with a pad on your orthotic and an arch strap. We are glad that you're having some improvement in your symptoms while walking in the hallway.   As for your left hip pain this is very likely due to arthritis in your back. We noticed that you have some weakness in your hip flexors and we have given you some exercises to help strengthen these muscles.  It was very nice meeting you today  Juan Quince L. Juan Hudson, [place] Resident PGY-2 04/03/2017 12:11 PM

## 2017-04-08 DIAGNOSIS — M545 Low back pain: Secondary | ICD-10-CM | POA: Diagnosis not present

## 2017-04-08 DIAGNOSIS — M5416 Radiculopathy, lumbar region: Secondary | ICD-10-CM | POA: Insufficient documentation

## 2017-04-08 NOTE — Assessment & Plan Note (Signed)
See OV today with signs of OA midfoot

## 2017-04-08 NOTE — Assessment & Plan Note (Signed)
Follow to see if improving hip strength and less pain Keep up some flexion exercises

## 2017-04-11 DIAGNOSIS — M5416 Radiculopathy, lumbar region: Secondary | ICD-10-CM | POA: Diagnosis not present

## 2017-04-11 DIAGNOSIS — M545 Low back pain: Secondary | ICD-10-CM | POA: Diagnosis not present

## 2017-04-14 DIAGNOSIS — M5416 Radiculopathy, lumbar region: Secondary | ICD-10-CM | POA: Diagnosis not present

## 2017-04-14 DIAGNOSIS — M545 Low back pain: Secondary | ICD-10-CM | POA: Diagnosis not present

## 2017-04-15 DIAGNOSIS — I83893 Varicose veins of bilateral lower extremities with other complications: Secondary | ICD-10-CM | POA: Diagnosis not present

## 2017-04-15 DIAGNOSIS — I8312 Varicose veins of left lower extremity with inflammation: Secondary | ICD-10-CM | POA: Diagnosis not present

## 2017-04-15 DIAGNOSIS — I8311 Varicose veins of right lower extremity with inflammation: Secondary | ICD-10-CM | POA: Diagnosis not present

## 2017-04-15 DIAGNOSIS — I83812 Varicose veins of left lower extremities with pain: Secondary | ICD-10-CM | POA: Diagnosis not present

## 2017-04-22 DIAGNOSIS — D045 Carcinoma in situ of skin of trunk: Secondary | ICD-10-CM | POA: Diagnosis not present

## 2017-05-01 DIAGNOSIS — M5416 Radiculopathy, lumbar region: Secondary | ICD-10-CM | POA: Diagnosis not present

## 2017-05-01 DIAGNOSIS — M545 Low back pain: Secondary | ICD-10-CM | POA: Diagnosis not present

## 2017-05-08 DIAGNOSIS — M5416 Radiculopathy, lumbar region: Secondary | ICD-10-CM | POA: Diagnosis not present

## 2017-05-08 DIAGNOSIS — M545 Low back pain: Secondary | ICD-10-CM | POA: Diagnosis not present

## 2017-05-09 DIAGNOSIS — I8312 Varicose veins of left lower extremity with inflammation: Secondary | ICD-10-CM | POA: Diagnosis not present

## 2017-05-09 DIAGNOSIS — I8311 Varicose veins of right lower extremity with inflammation: Secondary | ICD-10-CM | POA: Diagnosis not present

## 2017-05-27 ENCOUNTER — Ambulatory Visit (INDEPENDENT_AMBULATORY_CARE_PROVIDER_SITE_OTHER): Payer: PPO | Admitting: Family Medicine

## 2017-05-27 ENCOUNTER — Encounter: Payer: Self-pay | Admitting: Family Medicine

## 2017-05-27 VITALS — BP 140/80 | Ht 69.0 in | Wt 182.0 lb

## 2017-05-27 DIAGNOSIS — M5416 Radiculopathy, lumbar region: Secondary | ICD-10-CM

## 2017-05-27 DIAGNOSIS — R29898 Other symptoms and signs involving the musculoskeletal system: Secondary | ICD-10-CM | POA: Diagnosis not present

## 2017-05-27 NOTE — Progress Notes (Signed)
  Juan Hudson - 71 y.o. male MRN [id number]  Date of birth: 25-Jul-1945    SUBJECTIVE:      Chief Complaint:/ HPI:   Left thigh weakness, inability to run comfortably, left posterior hip pain. 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 He has been doing physical therapy and has noted improvement in his gait is still has a lot of leg weakness. He particularly notices this when he tries to climb up into his truck. He denies having any increased pain all running and says he doesn't think it gets weaker after he runs but he's fearful of frontal him because he is afraid he'll make something worse. He would like to be able to go back to running on a regular basis.  At night he will have pain in the left posterior hip/buttock area that radiates a little bit into the anterior thigh but not down the leg. Denies groin pain. Not having any giving way of the left thigh.   ROS:     Denies bowel or bladder incontinence. No fevers, no unusual weight change. No other unusual arthralgias or myalgias.  PERTINENT  PMH / PSH FH / / SH:  Past Medical, Surgical, Social, and Family History Reviewed & Updated in the EMR.  Pertinent findings include:  Obstructive sleep apnea He reports being on blood pressure medicine for hypertension but says he is "trying to taper off that." Previously on therapy for cholesterol.  OBJECTIVE: BP 140/80   Ht 5\' 9"  (1.753 m)   Wt 182 lb (82.6 kg)   BMI 26.88 kg/m   Physical Exam:  Vital signs are reviewed. GEN.: Well-developed male no acute distress GAIT: Normal stride length that is symmetrical. He does not limp. Negative Trendelenburg. BUTTOCK: Nontender to palpation in the piriformis area. He has no tenderness to palpation over the left greater trochanteric bursa area HIPS: Internal/external rotation symmetrical and full, painless. Hip flexion in seated position is 5 out of 5 and symmetrical. In supine position,  hip flexion of the left hip is 2 out of 5 compared with 5 out of 5 on the right. ANKLES: Dorsiflexion/plantarflexion bilaterally symmetrical. VASCULAR: Dorsalis pedis pulses are 2+ bilaterally symmetrical.  ASSESSMENT & PLAN:  See problem based charting & AVS for pt instructions.

## 2017-05-27 NOTE — Assessment & Plan Note (Signed)
Long discussion. It was not clear initially what exactly he wanted to do about this issue. His sounds like his weakness is somewhat better. Certainly he says his gait is better. He recently had a friend who was diagnosed with some metastatic bone cancer I think this worried him so ultimately what we concluded was that he wanted to follow-up with a more in-depth evaluation including MRI of the LS spine. We'll set that up and I'll see him back. I would continue physical therapy in the interim since he seems to be improving from that. Greater than 50% of our 25 minute office visit was spent in counseling and education regarding these issues.

## 2017-06-02 DIAGNOSIS — N5201 Erectile dysfunction due to arterial insufficiency: Secondary | ICD-10-CM | POA: Diagnosis not present

## 2017-06-02 DIAGNOSIS — E291 Testicular hypofunction: Secondary | ICD-10-CM | POA: Diagnosis not present

## 2017-06-06 ENCOUNTER — Ambulatory Visit
Admission: RE | Admit: 2017-06-06 | Discharge: 2017-06-06 | Disposition: A | Discharge status: Home / Self Care | Payer: PPO | Source: Ambulatory Visit | Attending: Family Medicine | Admitting: Family Medicine

## 2017-06-06 DIAGNOSIS — R29898 Other symptoms and signs involving the musculoskeletal system: Secondary | ICD-10-CM

## 2017-06-06 DIAGNOSIS — M5126 Other intervertebral disc displacement, lumbar region: Secondary | ICD-10-CM | POA: Diagnosis not present

## 2017-06-10 ENCOUNTER — Ambulatory Visit: Payer: PPO | Admitting: Family Medicine

## 2017-06-10 ENCOUNTER — Encounter: Payer: Self-pay | Admitting: Family Medicine

## 2017-06-10 DIAGNOSIS — M5416 Radiculopathy, lumbar region: Secondary | ICD-10-CM | POA: Diagnosis not present

## 2017-06-10 DIAGNOSIS — I83812 Varicose veins of left lower extremities with pain: Secondary | ICD-10-CM | POA: Diagnosis not present

## 2017-06-10 DIAGNOSIS — I8312 Varicose veins of left lower extremity with inflammation: Secondary | ICD-10-CM | POA: Diagnosis not present

## 2017-06-10 NOTE — Assessment & Plan Note (Addendum)
MRI does show some corresponding degenerative change at L2-L5 that corresponds with his leg weakness. I'm not sure he needs to have anything done about this but he might want to have a consultation. Currently he is able to do the activities he wants to do but he has had pretty persistent radicular pain. He has previously seen Dr. Ellene Route would like to follow-up with him. We will make an appointment for evaluation. I reviewed his MRI images in detail. We discussed options including continued conservative management and surgical consultation.Greater than 50% of our 25 minute office visit was spent in counseling and education regarding these issues.

## 2017-06-10 NOTE — Progress Notes (Signed)
    CHIEF COMPLAINT / HPI: Follow-up left leg pain and weakness. He has continued physical therapy and is some better. He has been running and actually ran about 2-1/2 miles over the weekend. Did not have any falls, no giving way of his leg. He is here for follow-up of his MRI results.   REVIEW OF SYSTEMS:  No incontinence of bowel or bladder. Leg weakness on the left is better but still persists. No numbness in the feet.  PERTINENT  PMH / PSH: I have reviewed the patient's medications, allergies, past medical and surgical history, smoking status and updated in the EMR as appropriate.   OBJECTIVE:   GEN.: Well-developed male no acute distress IMAGING: MRI images were reviewed with him in detail. We looked at the foraminal crowding on the left L2 through L5 area specifically.  ASSESSMENT / PLAN: Please see problem oriented charting for details

## 2017-06-18 NOTE — Addendum Note (Signed)
Addended by: Jolinda Croak E on: 06/18/2017 04:25 PM   Modules accepted: Orders

## 2017-06-24 DIAGNOSIS — I8312 Varicose veins of left lower extremity with inflammation: Secondary | ICD-10-CM | POA: Diagnosis not present

## 2017-06-24 DIAGNOSIS — M7981 Nontraumatic hematoma of soft tissue: Secondary | ICD-10-CM | POA: Diagnosis not present

## 2017-07-03 DIAGNOSIS — L821 Other seborrheic keratosis: Secondary | ICD-10-CM | POA: Diagnosis not present

## 2017-07-03 DIAGNOSIS — L570 Actinic keratosis: Secondary | ICD-10-CM | POA: Diagnosis not present

## 2017-07-03 DIAGNOSIS — Z85828 Personal history of other malignant neoplasm of skin: Secondary | ICD-10-CM | POA: Diagnosis not present

## 2017-07-03 DIAGNOSIS — Z23 Encounter for immunization: Secondary | ICD-10-CM | POA: Diagnosis not present

## 2017-07-03 DIAGNOSIS — D225 Melanocytic nevi of trunk: Secondary | ICD-10-CM | POA: Diagnosis not present

## 2017-07-17 DIAGNOSIS — I10 Essential (primary) hypertension: Secondary | ICD-10-CM | POA: Diagnosis not present

## 2017-07-17 DIAGNOSIS — E298 Other testicular dysfunction: Secondary | ICD-10-CM | POA: Diagnosis not present

## 2017-07-17 DIAGNOSIS — Z125 Encounter for screening for malignant neoplasm of prostate: Secondary | ICD-10-CM | POA: Diagnosis not present

## 2017-07-17 DIAGNOSIS — E7849 Other hyperlipidemia: Secondary | ICD-10-CM | POA: Diagnosis not present

## 2017-07-24 DIAGNOSIS — I83812 Varicose veins of left lower extremities with pain: Secondary | ICD-10-CM | POA: Diagnosis not present

## 2017-07-24 DIAGNOSIS — I8312 Varicose veins of left lower extremity with inflammation: Secondary | ICD-10-CM | POA: Diagnosis not present

## 2017-08-13 DIAGNOSIS — Z6828 Body mass index (BMI) 28.0-28.9, adult: Secondary | ICD-10-CM | POA: Diagnosis not present

## 2017-08-13 DIAGNOSIS — M47816 Spondylosis without myelopathy or radiculopathy, lumbar region: Secondary | ICD-10-CM | POA: Diagnosis not present

## 2017-08-13 DIAGNOSIS — I10 Essential (primary) hypertension: Secondary | ICD-10-CM | POA: Diagnosis not present

## 2017-08-14 DIAGNOSIS — E291 Testicular hypofunction: Secondary | ICD-10-CM | POA: Diagnosis not present

## 2017-08-14 DIAGNOSIS — I10 Essential (primary) hypertension: Secondary | ICD-10-CM | POA: Diagnosis not present

## 2017-08-14 DIAGNOSIS — R82998 Other abnormal findings in urine: Secondary | ICD-10-CM | POA: Diagnosis not present

## 2017-08-14 DIAGNOSIS — E7849 Other hyperlipidemia: Secondary | ICD-10-CM | POA: Diagnosis not present

## 2017-08-14 DIAGNOSIS — Z125 Encounter for screening for malignant neoplasm of prostate: Secondary | ICD-10-CM | POA: Diagnosis not present

## 2017-08-14 DIAGNOSIS — E298 Other testicular dysfunction: Secondary | ICD-10-CM | POA: Diagnosis not present

## 2017-08-18 DIAGNOSIS — M545 Low back pain: Secondary | ICD-10-CM | POA: Diagnosis not present

## 2017-08-18 DIAGNOSIS — M5416 Radiculopathy, lumbar region: Secondary | ICD-10-CM | POA: Diagnosis not present

## 2017-08-21 DIAGNOSIS — M79605 Pain in left leg: Secondary | ICD-10-CM | POA: Diagnosis not present

## 2017-08-21 DIAGNOSIS — Z6828 Body mass index (BMI) 28.0-28.9, adult: Secondary | ICD-10-CM | POA: Diagnosis not present

## 2017-08-21 DIAGNOSIS — E7849 Other hyperlipidemia: Secondary | ICD-10-CM | POA: Diagnosis not present

## 2017-08-21 DIAGNOSIS — M5416 Radiculopathy, lumbar region: Secondary | ICD-10-CM | POA: Diagnosis not present

## 2017-08-21 DIAGNOSIS — G4733 Obstructive sleep apnea (adult) (pediatric): Secondary | ICD-10-CM | POA: Diagnosis not present

## 2017-08-21 DIAGNOSIS — Z1389 Encounter for screening for other disorder: Secondary | ICD-10-CM | POA: Diagnosis not present

## 2017-08-21 DIAGNOSIS — Z0000 Encounter for general adult medical examination without abnormal findings: Secondary | ICD-10-CM | POA: Diagnosis not present

## 2017-08-21 DIAGNOSIS — K649 Unspecified hemorrhoids: Secondary | ICD-10-CM | POA: Diagnosis not present

## 2017-08-21 DIAGNOSIS — R413 Other amnesia: Secondary | ICD-10-CM | POA: Diagnosis not present

## 2017-08-21 DIAGNOSIS — I10 Essential (primary) hypertension: Secondary | ICD-10-CM | POA: Diagnosis not present

## 2017-08-21 DIAGNOSIS — R2689 Other abnormalities of gait and mobility: Secondary | ICD-10-CM | POA: Diagnosis not present

## 2017-08-21 DIAGNOSIS — I2510 Atherosclerotic heart disease of native coronary artery without angina pectoris: Secondary | ICD-10-CM | POA: Diagnosis not present

## 2017-08-28 DIAGNOSIS — L570 Actinic keratosis: Secondary | ICD-10-CM | POA: Diagnosis not present

## 2017-08-28 DIAGNOSIS — M545 Low back pain: Secondary | ICD-10-CM | POA: Diagnosis not present

## 2017-08-28 DIAGNOSIS — L821 Other seborrheic keratosis: Secondary | ICD-10-CM | POA: Diagnosis not present

## 2017-08-28 DIAGNOSIS — M5416 Radiculopathy, lumbar region: Secondary | ICD-10-CM | POA: Diagnosis not present

## 2017-09-02 DIAGNOSIS — M545 Low back pain: Secondary | ICD-10-CM | POA: Diagnosis not present

## 2017-09-02 DIAGNOSIS — I8311 Varicose veins of right lower extremity with inflammation: Secondary | ICD-10-CM | POA: Diagnosis not present

## 2017-09-02 DIAGNOSIS — I83811 Varicose veins of right lower extremities with pain: Secondary | ICD-10-CM | POA: Diagnosis not present

## 2017-09-02 DIAGNOSIS — M5416 Radiculopathy, lumbar region: Secondary | ICD-10-CM | POA: Diagnosis not present

## 2017-09-04 DIAGNOSIS — I83811 Varicose veins of right lower extremities with pain: Secondary | ICD-10-CM | POA: Diagnosis not present

## 2017-09-04 DIAGNOSIS — I8311 Varicose veins of right lower extremity with inflammation: Secondary | ICD-10-CM | POA: Diagnosis not present

## 2017-09-16 DIAGNOSIS — M5416 Radiculopathy, lumbar region: Secondary | ICD-10-CM | POA: Diagnosis not present

## 2017-09-16 DIAGNOSIS — M545 Low back pain: Secondary | ICD-10-CM | POA: Diagnosis not present

## 2017-09-18 DIAGNOSIS — I8311 Varicose veins of right lower extremity with inflammation: Secondary | ICD-10-CM | POA: Diagnosis not present

## 2017-09-23 DIAGNOSIS — M545 Low back pain: Secondary | ICD-10-CM | POA: Diagnosis not present

## 2017-09-23 DIAGNOSIS — M5416 Radiculopathy, lumbar region: Secondary | ICD-10-CM | POA: Diagnosis not present

## 2017-09-30 DIAGNOSIS — M5416 Radiculopathy, lumbar region: Secondary | ICD-10-CM | POA: Diagnosis not present

## 2017-09-30 DIAGNOSIS — M545 Low back pain: Secondary | ICD-10-CM | POA: Diagnosis not present

## 2017-10-09 DIAGNOSIS — M5416 Radiculopathy, lumbar region: Secondary | ICD-10-CM | POA: Diagnosis not present

## 2017-10-09 DIAGNOSIS — M545 Low back pain: Secondary | ICD-10-CM | POA: Diagnosis not present

## 2017-10-23 DIAGNOSIS — M545 Low back pain: Secondary | ICD-10-CM | POA: Diagnosis not present

## 2017-10-23 DIAGNOSIS — M5416 Radiculopathy, lumbar region: Secondary | ICD-10-CM | POA: Diagnosis not present

## 2017-11-06 DIAGNOSIS — I83891 Varicose veins of right lower extremities with other complications: Secondary | ICD-10-CM | POA: Diagnosis not present

## 2017-11-06 DIAGNOSIS — I8311 Varicose veins of right lower extremity with inflammation: Secondary | ICD-10-CM | POA: Diagnosis not present

## 2018-01-01 DIAGNOSIS — I8311 Varicose veins of right lower extremity with inflammation: Secondary | ICD-10-CM | POA: Diagnosis not present

## 2018-01-05 DIAGNOSIS — E291 Testicular hypofunction: Secondary | ICD-10-CM | POA: Diagnosis not present

## 2018-02-03 DIAGNOSIS — D485 Neoplasm of uncertain behavior of skin: Secondary | ICD-10-CM | POA: Diagnosis not present

## 2018-02-03 DIAGNOSIS — D225 Melanocytic nevi of trunk: Secondary | ICD-10-CM | POA: Diagnosis not present

## 2018-02-03 DIAGNOSIS — Z85828 Personal history of other malignant neoplasm of skin: Secondary | ICD-10-CM | POA: Diagnosis not present

## 2018-02-03 DIAGNOSIS — L821 Other seborrheic keratosis: Secondary | ICD-10-CM | POA: Diagnosis not present

## 2018-02-03 DIAGNOSIS — L570 Actinic keratosis: Secondary | ICD-10-CM | POA: Diagnosis not present

## 2018-02-18 ENCOUNTER — Encounter: Payer: Self-pay | Admitting: Internal Medicine

## 2018-02-19 ENCOUNTER — Ambulatory Visit: Payer: PPO | Admitting: Internal Medicine

## 2018-02-19 ENCOUNTER — Encounter: Payer: Self-pay | Admitting: Internal Medicine

## 2018-02-19 VITALS — BP 122/78 | HR 87 | Ht 69.0 in | Wt 188.4 lb

## 2018-02-19 DIAGNOSIS — R011 Cardiac murmur, unspecified: Secondary | ICD-10-CM | POA: Diagnosis not present

## 2018-02-19 DIAGNOSIS — G4733 Obstructive sleep apnea (adult) (pediatric): Secondary | ICD-10-CM

## 2018-02-19 NOTE — Patient Instructions (Addendum)
Order- [facility]ME [facility]- please replace supplies, continue CPAP auto 5-15, mask of choice, humidifier, AirView  Please call if we can help

## 2018-02-19 NOTE — Progress Notes (Signed)
Subjective:    Patient ID: Juan Hudson, male    DOB: 04-17-1946, 72 y.o.   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  He had UPPP surgery and septoplasty by Dr Wilburn Cornelia, but unsuccessfull.  -------------------------------------------------------------------------  02/18/17- 72 year old male former smoker followed for OSA CPAP [facility] auto 5-15 Most recently seen by Dr. Corrie Dandy in 12/2015 , replacing CPAP which had been stolen on a trip to [place]. At last visit had successfully obtained new machine covered by his homeowners insurance, not Medicare, auto 5-15. Former [facility] patient. DME [facility] will need to send  order for new CPAP supplies. DL attached. Pt states he wears CPAP nightly if he does not fall asleep prior to putting mask on.  Compliance download 60%/4 hours, AHI 5.5/hour. He falls asleep occasionally without putting it on and sometimes doesn't put it back on after up to bathroom. He definitely sleeps well with CPAP and is comfortable with the pressures. He feels better off having CPAP.  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 Pt also uses older CPAP when traveling. Pressure support works well; will need order for new supplies.  Download 80% compliance AHI 5.6/hour.  He uses CPAP every night but his travel machine does not record on his download.  He sleeps much better with CPAP and is told he snores loudly if he tries to skip it.  He is getting close to retirement.  Denies acute respiratory events.  ROS-see HPI   + = positive Constitutional:    weight loss, night sweats, fevers, chills, fatigue, lassitude. HEENT:    headaches, difficulty swallowing, tooth/dental problems, sore throat,       sneezing, itching, ear ache, nasal  congestion, post nasal drip, snoring CV:    chest pain, orthopnea, PND, swelling in lower extremities, anasarca,                                                     dizziness, palpitations Resp:   shortness of breath with exertion or at rest.                productive cough,   non-productive cough, coughing up of blood.              change in color of mucus.  wheezing.   Skin:    rash or lesions. GI:  No-   heartburn, indigestion, abdominal pain, nausea, vomiting, diarrhea,                 change in bowel habits, loss of appetite GU: dysuria, change in color of urine, no urgency or frequency.   flank pain. MS:   joint pain, stiffness, decreased range of motion, back pain. Neuro-     nothing unusual Psych:  change in mood or affect.  depression or anxiety.   memory loss.    Objective:   Physical Exam General- Alert, Oriented, Affect-appropriate, Distress- none acute , healthy appearing Skin-  Lymphadenopathy- none Head- atraumatic            Eyes- Gross vision intact, PERRLA, conjunctivae clear secretions  Ears- Hearing, canals normal            Nose- Clear, No-Septal dev, mucus, polyps, erosion, perforation             Throat- S/p UPPP , mucosa clear , drainage- none, tonsils- atrophic Neck- flexible , trachea midline, no stridor , thyroid nl, carotid no bruit Chest - symmetrical excursion , unlabored           Heart/CV- RRR ,  TWKMQK+8/6 systolic/ aortic , no gallop  , no rub, nl s1 s2                           - JVD- none , edema- none, stasis changes- none, varices- none           Lung- clear to P&A, wheeze- none, cough- none , dullness-none, rub- none           Chest wall-  Abd- tender-no, distended-no, bowel sounds-present, HSM- no Br/ Gen/ Rectal- Not done, not indicated Extrem- cyanosis- none, clubbing, none, atrophy- none, strength- nl Neuro- grossly intact to observation         Assessment & Plan:

## 2018-02-20 NOTE — Assessment & Plan Note (Signed)
He continues to benefit from CPAP with improved sleep and suppress snoring.  He is told he snores loudly if he skips it.  Good compliance and control. Plan-continue CPAP auto 5-15, update supplies

## 2018-02-20 NOTE — Assessment & Plan Note (Signed)
Does not sound worse.  He is followed by cardiology.

## 2018-03-24 DIAGNOSIS — L570 Actinic keratosis: Secondary | ICD-10-CM | POA: Diagnosis not present

## 2018-03-24 DIAGNOSIS — C44529 Squamous cell carcinoma of skin of other part of trunk: Secondary | ICD-10-CM | POA: Diagnosis not present

## 2018-03-24 DIAGNOSIS — D485 Neoplasm of uncertain behavior of skin: Secondary | ICD-10-CM | POA: Diagnosis not present

## 2018-04-23 DIAGNOSIS — Z23 Encounter for immunization: Secondary | ICD-10-CM | POA: Diagnosis not present

## 2018-04-23 DIAGNOSIS — L570 Actinic keratosis: Secondary | ICD-10-CM | POA: Diagnosis not present

## 2018-04-28 DIAGNOSIS — C44529 Squamous cell carcinoma of skin of other part of trunk: Secondary | ICD-10-CM | POA: Diagnosis not present

## 2018-05-21 DIAGNOSIS — I8312 Varicose veins of left lower extremity with inflammation: Secondary | ICD-10-CM | POA: Diagnosis not present

## 2018-05-21 DIAGNOSIS — I8311 Varicose veins of right lower extremity with inflammation: Secondary | ICD-10-CM | POA: Diagnosis not present

## 2018-06-15 DIAGNOSIS — G4733 Obstructive sleep apnea (adult) (pediatric): Secondary | ICD-10-CM | POA: Diagnosis not present

## 2018-07-16 DIAGNOSIS — E291 Testicular hypofunction: Secondary | ICD-10-CM | POA: Diagnosis not present

## 2018-07-16 DIAGNOSIS — N5201 Erectile dysfunction due to arterial insufficiency: Secondary | ICD-10-CM | POA: Diagnosis not present

## 2018-07-17 DIAGNOSIS — E291 Testicular hypofunction: Secondary | ICD-10-CM | POA: Diagnosis not present

## 2018-08-14 DIAGNOSIS — D225 Melanocytic nevi of trunk: Secondary | ICD-10-CM | POA: Diagnosis not present

## 2018-08-14 DIAGNOSIS — L821 Other seborrheic keratosis: Secondary | ICD-10-CM | POA: Diagnosis not present

## 2018-08-14 DIAGNOSIS — Z23 Encounter for immunization: Secondary | ICD-10-CM | POA: Diagnosis not present

## 2018-08-14 DIAGNOSIS — L570 Actinic keratosis: Secondary | ICD-10-CM | POA: Diagnosis not present

## 2018-08-14 DIAGNOSIS — Z85828 Personal history of other malignant neoplasm of skin: Secondary | ICD-10-CM | POA: Diagnosis not present

## 2018-08-14 DIAGNOSIS — L219 Seborrheic dermatitis, unspecified: Secondary | ICD-10-CM | POA: Diagnosis not present

## 2018-09-22 DIAGNOSIS — R82998 Other abnormal findings in urine: Secondary | ICD-10-CM | POA: Diagnosis not present

## 2018-09-22 DIAGNOSIS — I10 Essential (primary) hypertension: Secondary | ICD-10-CM | POA: Diagnosis not present

## 2018-09-22 DIAGNOSIS — E7849 Other hyperlipidemia: Secondary | ICD-10-CM | POA: Diagnosis not present

## 2018-09-22 DIAGNOSIS — Z125 Encounter for screening for malignant neoplasm of prostate: Secondary | ICD-10-CM | POA: Diagnosis not present

## 2018-09-29 ENCOUNTER — Other Ambulatory Visit: Payer: Self-pay | Admitting: Internal Medicine

## 2018-09-29 ENCOUNTER — Other Ambulatory Visit (HOSPITAL_COMMUNITY): Payer: Self-pay | Admitting: Internal Medicine

## 2018-09-29 DIAGNOSIS — Z0000 Encounter for general adult medical examination without abnormal findings: Secondary | ICD-10-CM | POA: Diagnosis not present

## 2018-09-29 DIAGNOSIS — M5416 Radiculopathy, lumbar region: Secondary | ICD-10-CM | POA: Diagnosis not present

## 2018-09-29 DIAGNOSIS — R011 Cardiac murmur, unspecified: Secondary | ICD-10-CM | POA: Diagnosis not present

## 2018-09-29 DIAGNOSIS — Z1331 Encounter for screening for depression: Secondary | ICD-10-CM | POA: Diagnosis not present

## 2018-09-29 DIAGNOSIS — M79605 Pain in left leg: Secondary | ICD-10-CM | POA: Diagnosis not present

## 2018-09-29 DIAGNOSIS — I10 Essential (primary) hypertension: Secondary | ICD-10-CM | POA: Diagnosis not present

## 2018-09-29 DIAGNOSIS — Z1212 Encounter for screening for malignant neoplasm of rectum: Secondary | ICD-10-CM | POA: Diagnosis not present

## 2018-09-29 DIAGNOSIS — R2689 Other abnormalities of gait and mobility: Secondary | ICD-10-CM | POA: Diagnosis not present

## 2018-09-29 DIAGNOSIS — I2510 Atherosclerotic heart disease of native coronary artery without angina pectoris: Secondary | ICD-10-CM | POA: Diagnosis not present

## 2018-09-29 DIAGNOSIS — H9193 Unspecified hearing loss, bilateral: Secondary | ICD-10-CM | POA: Diagnosis not present

## 2018-09-29 DIAGNOSIS — L718 Other rosacea: Secondary | ICD-10-CM | POA: Diagnosis not present

## 2018-09-29 DIAGNOSIS — R413 Other amnesia: Secondary | ICD-10-CM | POA: Diagnosis not present

## 2018-09-29 DIAGNOSIS — K649 Unspecified hemorrhoids: Secondary | ICD-10-CM | POA: Diagnosis not present

## 2018-10-01 DIAGNOSIS — M5416 Radiculopathy, lumbar region: Secondary | ICD-10-CM | POA: Diagnosis not present

## 2018-10-01 DIAGNOSIS — M545 Low back pain: Secondary | ICD-10-CM | POA: Diagnosis not present

## 2018-11-16 ENCOUNTER — Telehealth: Payer: Self-pay

## 2018-11-16 NOTE — Telephone Encounter (Signed)
New Message   Advised patient we're only doing stat Echo's at the moment and tried to call the echo department to get a date for the patient no answer.  Please call patient back to schedule.

## 2018-12-15 DIAGNOSIS — G4733 Obstructive sleep apnea (adult) (pediatric): Secondary | ICD-10-CM | POA: Diagnosis not present

## 2018-12-15 DIAGNOSIS — G4730 Sleep apnea, unspecified: Secondary | ICD-10-CM | POA: Diagnosis not present

## 2018-12-25 ENCOUNTER — Other Ambulatory Visit: Payer: Self-pay

## 2018-12-25 ENCOUNTER — Ambulatory Visit (HOSPITAL_COMMUNITY)
Admission: RE | Admit: 2018-12-25 | Discharge: 2018-12-25 | Disposition: A | Discharge status: Home / Self Care | Payer: PPO | Source: Ambulatory Visit | Attending: Internal Medicine | Admitting: Internal Medicine

## 2018-12-25 DIAGNOSIS — Z87891 Personal history of nicotine dependence: Secondary | ICD-10-CM | POA: Diagnosis not present

## 2018-12-25 DIAGNOSIS — I352 Nonrheumatic aortic (valve) stenosis with insufficiency: Secondary | ICD-10-CM | POA: Insufficient documentation

## 2018-12-25 DIAGNOSIS — I517 Cardiomegaly: Secondary | ICD-10-CM | POA: Insufficient documentation

## 2018-12-25 DIAGNOSIS — R011 Cardiac murmur, unspecified: Secondary | ICD-10-CM | POA: Diagnosis not present

## 2018-12-25 NOTE — Progress Notes (Signed)
  Echocardiogram 2D Echocardiogram has been performed.  Burnett Kanaris 12/25/2018, 1:20 PM

## 2018-12-31 ENCOUNTER — Encounter: Payer: Self-pay | Admitting: Internal Medicine

## 2018-12-31 ENCOUNTER — Telehealth: Payer: Self-pay | Admitting: Internal Medicine

## 2018-12-31 NOTE — Telephone Encounter (Signed)
[facility] patient last ov 02/19/2018 OSA compliance. He has dropped off aviation paperwork that needs signed. 1 year compliance printed and placed with paperwork. Next appt 02/23/19

## 2018-12-31 NOTE — Telephone Encounter (Signed)
Papers have been signed by CY in addition to a signed letter for pt. I called & spoke to the pt regarding this. Pt verbalized understanding and stated he will be coming by the office within the next 30 minutes to pick up the paperwork. I let him know we will have the papers ready for him when he arrives. Pt expressed understanding.   I have given these papers to one of the nurses working in the [facility]. Will keep encounter open to f/u on once pt has received papers.

## 2019-01-04 NOTE — Telephone Encounter (Signed)
Patient came back last week to pick up paperwork.   Will close this encounter.

## 2019-02-12 DIAGNOSIS — L570 Actinic keratosis: Secondary | ICD-10-CM | POA: Diagnosis not present

## 2019-02-12 DIAGNOSIS — D485 Neoplasm of uncertain behavior of skin: Secondary | ICD-10-CM | POA: Diagnosis not present

## 2019-02-12 DIAGNOSIS — L821 Other seborrheic keratosis: Secondary | ICD-10-CM | POA: Diagnosis not present

## 2019-02-12 DIAGNOSIS — Z85828 Personal history of other malignant neoplasm of skin: Secondary | ICD-10-CM | POA: Diagnosis not present

## 2019-02-12 DIAGNOSIS — D225 Melanocytic nevi of trunk: Secondary | ICD-10-CM | POA: Diagnosis not present

## 2019-02-12 DIAGNOSIS — C44622 Squamous cell carcinoma of skin of right upper limb, including shoulder: Secondary | ICD-10-CM | POA: Diagnosis not present

## 2019-02-12 DIAGNOSIS — L720 Epidermal cyst: Secondary | ICD-10-CM | POA: Diagnosis not present

## 2019-02-23 ENCOUNTER — Other Ambulatory Visit: Payer: Self-pay

## 2019-02-23 ENCOUNTER — Encounter: Payer: Self-pay | Admitting: Internal Medicine

## 2019-02-23 ENCOUNTER — Ambulatory Visit (INDEPENDENT_AMBULATORY_CARE_PROVIDER_SITE_OTHER): Payer: PPO

## 2019-02-23 ENCOUNTER — Ambulatory Visit: Payer: PPO | Admitting: Internal Medicine

## 2019-02-23 VITALS — BP 120/78 | HR 69 | Temp 97.8°F | Ht 69.0 in | Wt 181.2 lb

## 2019-02-23 DIAGNOSIS — J209 Acute bronchitis, unspecified: Secondary | ICD-10-CM

## 2019-02-23 DIAGNOSIS — G4733 Obstructive sleep apnea (adult) (pediatric): Secondary | ICD-10-CM

## 2019-02-23 DIAGNOSIS — J439 Emphysema, unspecified: Secondary | ICD-10-CM | POA: Diagnosis not present

## 2019-02-23 NOTE — Assessment & Plan Note (Signed)
Former smoker so he might have a little bronchitis to explain productive cough. Clinically overall lung function seems good and not limiting. Plan- CXR

## 2019-02-23 NOTE — Assessment & Plan Note (Signed)
He continues to benefit from CPAP with good compliance and control. No concerns.

## 2019-02-23 NOTE — Patient Instructions (Signed)
Order- CXR    Dx subacute bronchitis  We can continue CPAP auto 5-15, mask of choice, humidifier, supplies, AirView/ card  Please call if we can help

## 2019-02-23 NOTE — Progress Notes (Signed)
Subjective:    Patient ID: Juan Hudson, male    DOB: 02/11/1946, 73 y.o.   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  He had UPPP surgery and septoplasty by Dr Wilburn Cornelia, but unsuccessfull.  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 Pt also uses older CPAP when traveling. Pressure support works well; will need order for new supplies.  Download 80% compliance AHI 5.6/hour.  He uses CPAP every night but his travel machine does not record on his download.  He sleeps much better with CPAP and is told he snores loudly if he tries to skip it.  He is getting close to retirement.  Denies acute respiratory events.  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 Nasal mask. Machine is 3-4 yrs old.  Now working toward a Wellsite geologist. Has noted he clears chest of some clear phlegm once or twice daily, but denies significant cough, wheeze or dyspnea. Agreed to CXR just to check.  ROS-see HPI   + = positive Constitutional:    weight loss, night sweats, fevers, chills, fatigue, lassitude. HEENT:    headaches, difficulty swallowing, tooth/dental problems, sore throat,       sneezing, itching, ear ache, nasal congestion, post nasal drip, snoring CV:    chest pain, orthopnea, PND, swelling in lower extremities, anasarca,                          dizziness, palpitations Resp:   shortness of breath with exertion or at rest.                +productive cough,   non-productive cough, coughing up of blood.              change in color of mucus.  wheezing.   Skin:    rash or lesions. GI:  No-   heartburn, indigestion, abdominal pain, nausea, vomiting, diarrhea,                 change in bowel habits, loss of appetite GU: dysuria, change in color of urine, no urgency or frequency.   flank pain. MS:   joint pain, stiffness, decreased range of motion, back pain. Neuro-     nothing unusual Psych:  change in mood or affect.  depression or anxiety.   memory loss.    Objective:   Physical Exam General- Alert, Oriented, Affect-appropriate, Distress- none acute , healthy appearing Skin- no rash Lymphadenopathy- none Head- atraumatic            Eyes- Gross vision intact, PERRLA, conjunctivae clear secretions            Ears- Hearing, canals normal            Nose- Clear, No-Septal dev, mucus, polyps, erosion, perforation             Throat- S/p UPPP , mucosa clear , drainage- none, tonsils- atrophic Neck- flexible , trachea midline, no stridor , thyroid  nl, carotid no bruit Chest - symmetrical excursion , unlabored           Heart/CV- RRR ,  VOPFYT+2/4 systolic/ aortic , no gallop  , no rub, nl s1 s2                           - JVD- none , edema- none, stasis changes- none, varices- none           Lung- clear to P&A, wheeze- none, cough- none , dullness-none, rub- none           Chest wall-  Abd- tender-no, distended-no, bowel sounds-present, HSM- no Br/ Gen/ Rectal- Not done, not indicated Extrem- cyanosis- none, clubbing, none, atrophy- none, strength- nl Neuro- grossly intact to observation    Assessment & Plan:

## 2019-02-24 ENCOUNTER — Telehealth: Payer: Self-pay | Admitting: Internal Medicine

## 2019-02-24 NOTE — Telephone Encounter (Signed)
Notes recorded by Deneise Lever, MD on 02/23/2019 at 10:29 AM EDT  CXR- There is some emphysema and old scarring. Stable with no acute process seen. --------------------------------------------- LMTCB x1 for pt.

## 2019-02-24 NOTE — Telephone Encounter (Signed)
Spoke with pt. He is aware of results. Nothing further was needed. 

## 2019-03-16 DIAGNOSIS — G4730 Sleep apnea, unspecified: Secondary | ICD-10-CM | POA: Diagnosis not present

## 2019-03-16 DIAGNOSIS — G4733 Obstructive sleep apnea (adult) (pediatric): Secondary | ICD-10-CM | POA: Diagnosis not present

## 2019-03-24 DIAGNOSIS — C44622 Squamous cell carcinoma of skin of right upper limb, including shoulder: Secondary | ICD-10-CM | POA: Diagnosis not present

## 2019-03-24 DIAGNOSIS — L988 Other specified disorders of the skin and subcutaneous tissue: Secondary | ICD-10-CM | POA: Diagnosis not present

## 2019-03-24 DIAGNOSIS — L570 Actinic keratosis: Secondary | ICD-10-CM | POA: Diagnosis not present

## 2019-03-24 DIAGNOSIS — M7022 Olecranon bursitis, left elbow: Secondary | ICD-10-CM | POA: Diagnosis not present

## 2019-04-08 DIAGNOSIS — E291 Testicular hypofunction: Secondary | ICD-10-CM | POA: Diagnosis not present

## 2019-04-15 DIAGNOSIS — I8312 Varicose veins of left lower extremity with inflammation: Secondary | ICD-10-CM | POA: Diagnosis not present

## 2019-04-15 DIAGNOSIS — I8311 Varicose veins of right lower extremity with inflammation: Secondary | ICD-10-CM | POA: Diagnosis not present

## 2019-04-28 DIAGNOSIS — M7022 Olecranon bursitis, left elbow: Secondary | ICD-10-CM | POA: Diagnosis not present

## 2019-05-04 DIAGNOSIS — G4733 Obstructive sleep apnea (adult) (pediatric): Secondary | ICD-10-CM | POA: Diagnosis not present

## 2019-05-04 DIAGNOSIS — I10 Essential (primary) hypertension: Secondary | ICD-10-CM | POA: Diagnosis not present

## 2019-05-04 DIAGNOSIS — E291 Testicular hypofunction: Secondary | ICD-10-CM | POA: Diagnosis not present

## 2019-05-04 DIAGNOSIS — R2689 Other abnormalities of gait and mobility: Secondary | ICD-10-CM | POA: Diagnosis not present

## 2019-05-04 DIAGNOSIS — R413 Other amnesia: Secondary | ICD-10-CM | POA: Diagnosis not present

## 2019-05-04 DIAGNOSIS — E785 Hyperlipidemia, unspecified: Secondary | ICD-10-CM | POA: Diagnosis not present

## 2019-05-04 DIAGNOSIS — R1032 Left lower quadrant pain: Secondary | ICD-10-CM | POA: Diagnosis not present

## 2019-05-04 DIAGNOSIS — I2510 Atherosclerotic heart disease of native coronary artery without angina pectoris: Secondary | ICD-10-CM | POA: Diagnosis not present

## 2019-05-05 ENCOUNTER — Other Ambulatory Visit: Payer: Self-pay | Admitting: Internal Medicine

## 2019-05-05 ENCOUNTER — Ambulatory Visit
Admission: RE | Admit: 2019-05-05 | Discharge: 2019-05-05 | Disposition: A | Discharge status: Home / Self Care | Payer: PPO | Source: Ambulatory Visit | Attending: Internal Medicine | Admitting: Internal Medicine

## 2019-05-05 ENCOUNTER — Other Ambulatory Visit: Payer: Self-pay

## 2019-05-05 DIAGNOSIS — R1032 Left lower quadrant pain: Secondary | ICD-10-CM | POA: Diagnosis not present

## 2019-05-05 MED ORDER — IOPAMIDOL (ISOVUE-300) INJECTION 61%
100.0000 mL | Freq: Once | INTRAVENOUS | Status: AC | PRN
  Administered 2019-05-05: 100 mL via INTRAVENOUS

## 2019-05-27 DIAGNOSIS — G4733 Obstructive sleep apnea (adult) (pediatric): Secondary | ICD-10-CM | POA: Diagnosis not present

## 2019-05-27 DIAGNOSIS — G4730 Sleep apnea, unspecified: Secondary | ICD-10-CM | POA: Diagnosis not present

## 2019-06-02 DIAGNOSIS — M7022 Olecranon bursitis, left elbow: Secondary | ICD-10-CM | POA: Diagnosis not present

## 2019-06-15 DIAGNOSIS — M5416 Radiculopathy, lumbar region: Secondary | ICD-10-CM | POA: Diagnosis not present

## 2019-06-15 DIAGNOSIS — M545 Low back pain: Secondary | ICD-10-CM | POA: Diagnosis not present

## 2019-06-21 DIAGNOSIS — M545 Low back pain: Secondary | ICD-10-CM | POA: Diagnosis not present

## 2019-06-21 DIAGNOSIS — M5416 Radiculopathy, lumbar region: Secondary | ICD-10-CM | POA: Diagnosis not present

## 2019-06-28 DIAGNOSIS — M545 Low back pain: Secondary | ICD-10-CM | POA: Diagnosis not present

## 2019-06-28 DIAGNOSIS — M5416 Radiculopathy, lumbar region: Secondary | ICD-10-CM | POA: Diagnosis not present

## 2019-07-08 DIAGNOSIS — R948 Abnormal results of function studies of other organs and systems: Secondary | ICD-10-CM | POA: Diagnosis not present

## 2019-07-08 DIAGNOSIS — N5201 Erectile dysfunction due to arterial insufficiency: Secondary | ICD-10-CM | POA: Diagnosis not present

## 2019-07-12 DIAGNOSIS — E291 Testicular hypofunction: Secondary | ICD-10-CM | POA: Diagnosis not present

## 2019-07-12 DIAGNOSIS — N5201 Erectile dysfunction due to arterial insufficiency: Secondary | ICD-10-CM | POA: Diagnosis not present

## 2019-07-12 DIAGNOSIS — R3915 Urgency of urination: Secondary | ICD-10-CM | POA: Diagnosis not present

## 2019-08-19 DIAGNOSIS — M5416 Radiculopathy, lumbar region: Secondary | ICD-10-CM | POA: Diagnosis not present

## 2019-08-19 DIAGNOSIS — M545 Low back pain: Secondary | ICD-10-CM | POA: Diagnosis not present

## 2019-09-08 DIAGNOSIS — L821 Other seborrheic keratosis: Secondary | ICD-10-CM | POA: Diagnosis not present

## 2019-09-08 DIAGNOSIS — L720 Epidermal cyst: Secondary | ICD-10-CM | POA: Diagnosis not present

## 2019-09-08 DIAGNOSIS — L578 Other skin changes due to chronic exposure to nonionizing radiation: Secondary | ICD-10-CM | POA: Diagnosis not present

## 2019-09-08 DIAGNOSIS — L570 Actinic keratosis: Secondary | ICD-10-CM | POA: Diagnosis not present

## 2019-09-08 DIAGNOSIS — Z85828 Personal history of other malignant neoplasm of skin: Secondary | ICD-10-CM | POA: Diagnosis not present

## 2019-09-08 DIAGNOSIS — L111 Transient acantholytic dermatosis [Grover]: Secondary | ICD-10-CM | POA: Diagnosis not present

## 2019-09-08 DIAGNOSIS — D225 Melanocytic nevi of trunk: Secondary | ICD-10-CM | POA: Diagnosis not present

## 2019-09-10 DIAGNOSIS — E291 Testicular hypofunction: Secondary | ICD-10-CM | POA: Diagnosis not present

## 2019-09-10 DIAGNOSIS — R948 Abnormal results of function studies of other organs and systems: Secondary | ICD-10-CM | POA: Diagnosis not present

## 2019-10-06 DIAGNOSIS — H524 Presbyopia: Secondary | ICD-10-CM | POA: Diagnosis not present

## 2019-10-06 DIAGNOSIS — H2513 Age-related nuclear cataract, bilateral: Secondary | ICD-10-CM | POA: Diagnosis not present

## 2019-10-06 DIAGNOSIS — H43813 Vitreous degeneration, bilateral: Secondary | ICD-10-CM | POA: Diagnosis not present

## 2019-10-06 DIAGNOSIS — H04123 Dry eye syndrome of bilateral lacrimal glands: Secondary | ICD-10-CM | POA: Diagnosis not present

## 2019-11-04 DIAGNOSIS — L578 Other skin changes due to chronic exposure to nonionizing radiation: Secondary | ICD-10-CM | POA: Diagnosis not present

## 2019-11-04 DIAGNOSIS — L570 Actinic keratosis: Secondary | ICD-10-CM | POA: Diagnosis not present

## 2019-11-04 DIAGNOSIS — T451X5A Adverse effect of antineoplastic and immunosuppressive drugs, initial encounter: Secondary | ICD-10-CM | POA: Diagnosis not present

## 2019-11-22 DIAGNOSIS — E7849 Other hyperlipidemia: Secondary | ICD-10-CM | POA: Diagnosis not present

## 2019-11-22 DIAGNOSIS — Z125 Encounter for screening for malignant neoplasm of prostate: Secondary | ICD-10-CM | POA: Diagnosis not present

## 2019-11-23 DIAGNOSIS — I10 Essential (primary) hypertension: Secondary | ICD-10-CM | POA: Diagnosis not present

## 2019-11-23 DIAGNOSIS — M5416 Radiculopathy, lumbar region: Secondary | ICD-10-CM | POA: Diagnosis not present

## 2019-11-23 DIAGNOSIS — R2689 Other abnormalities of gait and mobility: Secondary | ICD-10-CM | POA: Diagnosis not present

## 2019-11-23 DIAGNOSIS — I351 Nonrheumatic aortic (valve) insufficiency: Secondary | ICD-10-CM | POA: Diagnosis not present

## 2019-11-23 DIAGNOSIS — Z1339 Encounter for screening examination for other mental health and behavioral disorders: Secondary | ICD-10-CM | POA: Diagnosis not present

## 2019-11-23 DIAGNOSIS — R251 Tremor, unspecified: Secondary | ICD-10-CM | POA: Diagnosis not present

## 2019-11-23 DIAGNOSIS — Z0000 Encounter for general adult medical examination without abnormal findings: Secondary | ICD-10-CM | POA: Diagnosis not present

## 2019-11-23 DIAGNOSIS — R82998 Other abnormal findings in urine: Secondary | ICD-10-CM | POA: Diagnosis not present

## 2019-11-23 DIAGNOSIS — R011 Cardiac murmur, unspecified: Secondary | ICD-10-CM | POA: Diagnosis not present

## 2019-11-23 DIAGNOSIS — I2510 Atherosclerotic heart disease of native coronary artery without angina pectoris: Secondary | ICD-10-CM | POA: Diagnosis not present

## 2019-11-23 DIAGNOSIS — R413 Other amnesia: Secondary | ICD-10-CM | POA: Diagnosis not present

## 2019-11-23 DIAGNOSIS — E785 Hyperlipidemia, unspecified: Secondary | ICD-10-CM | POA: Diagnosis not present

## 2019-11-23 DIAGNOSIS — L719 Rosacea, unspecified: Secondary | ICD-10-CM | POA: Diagnosis not present

## 2019-11-23 DIAGNOSIS — Z1331 Encounter for screening for depression: Secondary | ICD-10-CM | POA: Diagnosis not present

## 2019-11-23 DIAGNOSIS — M79605 Pain in left leg: Secondary | ICD-10-CM | POA: Diagnosis not present

## 2019-11-25 DIAGNOSIS — Z1212 Encounter for screening for malignant neoplasm of rectum: Secondary | ICD-10-CM | POA: Diagnosis not present

## 2019-11-29 DIAGNOSIS — I10 Essential (primary) hypertension: Secondary | ICD-10-CM | POA: Diagnosis not present

## 2019-11-30 DIAGNOSIS — M5416 Radiculopathy, lumbar region: Secondary | ICD-10-CM | POA: Diagnosis not present

## 2019-11-30 DIAGNOSIS — M545 Low back pain: Secondary | ICD-10-CM | POA: Diagnosis not present

## 2019-12-03 ENCOUNTER — Other Ambulatory Visit: Payer: Self-pay | Admitting: Internal Medicine

## 2019-12-03 DIAGNOSIS — M545 Low back pain, unspecified: Secondary | ICD-10-CM

## 2019-12-08 DIAGNOSIS — M5416 Radiculopathy, lumbar region: Secondary | ICD-10-CM | POA: Diagnosis not present

## 2019-12-08 DIAGNOSIS — M47816 Spondylosis without myelopathy or radiculopathy, lumbar region: Secondary | ICD-10-CM | POA: Diagnosis not present

## 2019-12-08 DIAGNOSIS — M4316 Spondylolisthesis, lumbar region: Secondary | ICD-10-CM | POA: Diagnosis not present

## 2019-12-08 DIAGNOSIS — M545 Low back pain: Secondary | ICD-10-CM | POA: Diagnosis not present

## 2019-12-13 DIAGNOSIS — M5416 Radiculopathy, lumbar region: Secondary | ICD-10-CM | POA: Diagnosis not present

## 2019-12-13 DIAGNOSIS — M545 Low back pain: Secondary | ICD-10-CM | POA: Diagnosis not present

## 2019-12-14 ENCOUNTER — Other Ambulatory Visit: Payer: Self-pay

## 2019-12-14 ENCOUNTER — Encounter: Payer: PPO | Admitting: Sports Medicine

## 2019-12-14 ENCOUNTER — Ambulatory Visit: Payer: PPO | Admitting: Sports Medicine

## 2019-12-14 VITALS — BP 130/80 | Ht 69.0 in | Wt 182.0 lb

## 2019-12-14 DIAGNOSIS — R269 Unspecified abnormalities of gait and mobility: Secondary | ICD-10-CM | POA: Diagnosis not present

## 2019-12-15 ENCOUNTER — Encounter: Payer: Self-pay | Admitting: Sports Medicine

## 2019-12-15 DIAGNOSIS — M4316 Spondylolisthesis, lumbar region: Secondary | ICD-10-CM | POA: Diagnosis not present

## 2019-12-15 NOTE — Progress Notes (Signed)
Patient ID: MAURIO COBERLEY, male   DOB: [birth year], 74 y.o.   MRN: [id number]  Patient comes in today requesting new custom orthotics.  He has had custom orthotics in the past.  His current orthotics are 75 years old.  He has no new complaints.  Physical examination of his feet shows collapse of the longitudinal arch with standing.  Collapse of the transverse arch as well.  No soft tissue swelling.  Good pulses.  New custom orthotics were created for [place] today.  He found them to be comfortable prior to leaving the office.  Gait was noted to be neutral with orthotics in his shoes.  He will follow-up as needed.  Patient was fitted for a : standard, cushioned, semi-rigid orthotic. The orthotic was heated and afterward the patient stood on the orthotic blank positioned on the orthotic stand. The patient was positioned in subtalar neutral position and 10 degrees of ankle dorsiflexion in a weight bearing stance. After completion of molding, a stable base was applied to the orthotic blank. The blank was ground to a stable position for weight bearing. Size: 11 Base: Blue EVA Posting: none Additional orthotic padding: Right: scaphoid pad and metatarsal cookie; Left:small MT pad

## 2019-12-22 ENCOUNTER — Other Ambulatory Visit (HOSPITAL_COMMUNITY): Payer: Self-pay | Admitting: Neurological Surgery

## 2019-12-22 ENCOUNTER — Other Ambulatory Visit: Payer: Self-pay | Admitting: Neurological Surgery

## 2019-12-22 DIAGNOSIS — M4316 Spondylolisthesis, lumbar region: Secondary | ICD-10-CM

## 2019-12-27 DIAGNOSIS — M5136 Other intervertebral disc degeneration, lumbar region: Secondary | ICD-10-CM | POA: Diagnosis not present

## 2019-12-27 DIAGNOSIS — M5416 Radiculopathy, lumbar region: Secondary | ICD-10-CM | POA: Diagnosis not present

## 2019-12-27 DIAGNOSIS — M48061 Spinal stenosis, lumbar region without neurogenic claudication: Secondary | ICD-10-CM | POA: Diagnosis not present

## 2020-01-04 ENCOUNTER — Other Ambulatory Visit: Payer: PPO

## 2020-01-04 ENCOUNTER — Other Ambulatory Visit: Payer: Self-pay

## 2020-01-04 ENCOUNTER — Ambulatory Visit (INDEPENDENT_AMBULATORY_CARE_PROVIDER_SITE_OTHER): Payer: PPO | Admitting: Sports Medicine

## 2020-01-04 VITALS — BP 124/70 | Ht 69.0 in | Wt 182.0 lb

## 2020-01-04 DIAGNOSIS — R269 Unspecified abnormalities of gait and mobility: Secondary | ICD-10-CM

## 2020-01-04 NOTE — Pre-Procedure Instructions (Signed)
Your procedure is scheduled on Tuesday, June 22nd from 07:30 AM- 12:28 PM.  Report to [facility]tr[facility] "A" at 05:30 A.M., and check in at the Admitting office.  Call this number if you have problems the morning of surgery:  [phone]  Call [phone] if you have any questions prior to your surgery date Monday-Friday 8am-4pm.    Remember:  Do not eat or drink after midnight the night before your surgery.     Take these medicines the morning of surgery with A SIP OF WATER: pravastatin (PRAVACHOL)   IF NEEDED: propranolol (INDERAL)  *Follow your surgeon's instructions on when to stop Aspirin.  If no instructions were given by your surgeon then you will need to call the office to get those instructions.     As of today, STOP taking any Aleve, Naproxen, Ibuprofen, Motrin, Advil, Goody's, BC's, all herbal medications, fish oil, and all vitamins.          The Morning of Surgery:            Do not wear jewelry.            Do not wear lotions, powders, colognes, or deodorant.            Men may shave face and neck.            Do not bring valuables to the hospital.            [facility] is not responsible for any belongings or valuables.  Do NOT Smoke (Tobacco/Vapping) or drink Alcohol 24 hours prior to your procedure If you use a CPAP at night, you may bring all equipment for your overnight stay.   Contacts, glasses, dentures or bridgework may not be worn into surgery.      For patients admitted to the hospital, discharge time will be determined by your treatment team.   Patients discharged the day of surgery will not be allowed to drive home, and someone needs to stay with them for 24 hours.    Special instructions:   [facility]- Preparing For Surgery  Before surgery, you can play an important role. Because skin is not sterile, your skin needs to be as free of germs as possible. You can reduce the number of germs on your skin by washing with CHG (chlorahexidine  gluconate) Soap before surgery.  CHG is an antiseptic cleaner which kills germs and bonds with the skin to continue killing germs even after washing.    Oral Hygiene is also important to reduce your risk of infection.  Remember - BRUSH YOUR TEETH THE MORNING OF SURGERY WITH YOUR REGULAR TOOTHPASTE  Please do not use if you have an allergy to CHG or antibacterial soaps. If your skin becomes reddened/irritated stop using the CHG.  Do not shave (including legs and underarms) for at least 48 hours prior to first CHG shower. It is OK to shave your face.  Please follow these instructions carefully.   1. Shower the NIGHT BEFORE SURGERY and the MORNING OF SURGERY with CHG Soap.   2. If you chose to wash your hair, wash your hair first as usual with your normal shampoo.  3. After you shampoo, rinse your hair and body thoroughly to remove the shampoo.  4. Use CHG as you would any other liquid soap. You can apply CHG directly to the skin and wash gently with a scrungie or a clean washcloth.   5. Apply the CHG Soap to your body  ONLY FROM THE NECK DOWN.  Do not use on open wounds or open sores. Avoid contact with your eyes, ears, mouth and genitals (private parts). Wash Face and genitals (private parts)  with your normal soap.   6. Wash thoroughly, paying special attention to the area where your surgery will be performed.  7. Thoroughly rinse your body with warm water from the neck down.  8. DO NOT shower/wash with your normal soap after using and rinsing off the CHG Soap.  9. Pat yourself dry with a CLEAN TOWEL.  10. Wear CLEAN PAJAMAS to bed the night before surgery, wear comfortable clothes the morning of surgery  11. Place CLEAN SHEETS on your bed the night of your first shower and DO NOT SLEEP WITH PETS.   Day of Surgery: Shower with CHG Soap.  Do not apply any deodorants/lotions.  Please wear clean clothes to the hospital/surgery center.   Remember to brush your teeth WITH YOUR REGULAR  TOOTHPASTE.   Please read over the following fact sheets that you were given.

## 2020-01-05 ENCOUNTER — Encounter (HOSPITAL_COMMUNITY): Payer: Self-pay

## 2020-01-05 ENCOUNTER — Ambulatory Visit (HOSPITAL_COMMUNITY)
Admission: RE | Admit: 2020-01-05 | Discharge: 2020-01-05 | Disposition: A | Discharge status: Home / Self Care | Payer: PPO | Source: Ambulatory Visit | Attending: Neurological Surgery | Admitting: Neurological Surgery

## 2020-01-05 ENCOUNTER — Encounter (HOSPITAL_COMMUNITY)
Admission: RE | Admit: 2020-01-05 | Discharge: 2020-01-05 | Disposition: A | Discharge status: Home / Self Care | Payer: PPO | Source: Ambulatory Visit | Attending: Neurological Surgery | Admitting: Neurological Surgery

## 2020-01-05 DIAGNOSIS — Z87891 Personal history of nicotine dependence: Secondary | ICD-10-CM | POA: Diagnosis not present

## 2020-01-05 DIAGNOSIS — Z7982 Long term (current) use of aspirin: Secondary | ICD-10-CM | POA: Diagnosis not present

## 2020-01-05 DIAGNOSIS — G4733 Obstructive sleep apnea (adult) (pediatric): Secondary | ICD-10-CM | POA: Diagnosis not present

## 2020-01-05 DIAGNOSIS — K5730 Diverticulosis of large intestine without perforation or abscess without bleeding: Secondary | ICD-10-CM | POA: Diagnosis not present

## 2020-01-05 DIAGNOSIS — Z79899 Other long term (current) drug therapy: Secondary | ICD-10-CM | POA: Diagnosis not present

## 2020-01-05 DIAGNOSIS — E785 Hyperlipidemia, unspecified: Secondary | ICD-10-CM | POA: Diagnosis not present

## 2020-01-05 DIAGNOSIS — K219 Gastro-esophageal reflux disease without esophagitis: Secondary | ICD-10-CM | POA: Diagnosis not present

## 2020-01-05 DIAGNOSIS — Z85828 Personal history of other malignant neoplasm of skin: Secondary | ICD-10-CM | POA: Diagnosis not present

## 2020-01-05 DIAGNOSIS — Z01818 Encounter for other preprocedural examination: Secondary | ICD-10-CM | POA: Diagnosis not present

## 2020-01-05 DIAGNOSIS — I700 Atherosclerosis of aorta: Secondary | ICD-10-CM | POA: Diagnosis not present

## 2020-01-05 DIAGNOSIS — M5126 Other intervertebral disc displacement, lumbar region: Secondary | ICD-10-CM | POA: Diagnosis not present

## 2020-01-05 DIAGNOSIS — I444 Left anterior fascicular block: Secondary | ICD-10-CM | POA: Insufficient documentation

## 2020-01-05 DIAGNOSIS — I10 Essential (primary) hypertension: Secondary | ICD-10-CM | POA: Diagnosis not present

## 2020-01-05 DIAGNOSIS — M4316 Spondylolisthesis, lumbar region: Secondary | ICD-10-CM | POA: Diagnosis not present

## 2020-01-05 DIAGNOSIS — I4510 Unspecified right bundle-branch block: Secondary | ICD-10-CM | POA: Insufficient documentation

## 2020-01-05 LAB — TYPE AND SCREEN
ABO/RH(D): O POS
Antibody Screen: NEGATIVE

## 2020-01-05 LAB — SURGICAL PCR SCREEN
MRSA, PCR: NEGATIVE
Staphylococcus aureus: NEGATIVE

## 2020-01-05 LAB — CBC
HCT: 49.3 % (ref 39.0–52.0)
Hemoglobin: 16.3 g/dL (ref 13.0–17.0)
MCH: 29.6 pg (ref 26.0–34.0)
MCHC: 33.1 g/dL (ref 30.0–36.0)
MCV: 89.5 fL (ref 80.0–100.0)
Platelets: 192 10*3/uL (ref 150–400)
RBC: 5.51 MIL/uL (ref 4.22–5.81)
RDW: 18.5 % — ABNORMAL HIGH (ref 11.5–15.5)
WBC: 7.3 10*3/uL (ref 4.0–10.5)
nRBC: 0 % (ref 0.0–0.2)

## 2020-01-05 LAB — BASIC METABOLIC PANEL
Anion gap: 8 (ref 5–15)
BUN: 27 mg/dL — ABNORMAL HIGH (ref 8–23)
CO2: 31 mmol/L (ref 22–32)
Calcium: 9.2 mg/dL (ref 8.9–10.3)
Chloride: 101 mmol/L (ref 98–111)
Creatinine, Ser: 1.16 mg/dL (ref 0.61–1.24)
GFR calc Af Amer: >60 mL/min (ref >60)
GFR calc non Af Amer: >60 mL/min (ref >60)
Glucose, Bld: 96 mg/dL (ref 70–99)
Potassium: 4.3 mmol/L (ref 3.5–5.1)
Sodium: 140 mmol/L (ref 135–145)

## 2020-01-05 LAB — ABO/RH: ABO/RH(D): O POS

## 2020-01-05 NOTE — Progress Notes (Addendum)
PCP - Dr. Joylene Draft Cardiologist - Dr. Doreatha Lew (no other cardiologist per pt.)  Chest x-ray - N/A EKG - 01/05/20 Stress Test - 05/14/04 ECHO - 12/25/18 Cardiac Cath - denies  Sleep Study - + OSA CPAP - uses QHS, does not know pressure settings   Aspirin Instructions: has been off x 2 weeks prior to steroid injections, to continue holding  Anesthesia review: review EKG  Patient denies shortness of breath, fever, cough and chest pain at PAT appointment   Patient verbalized understanding of instructions that were given to them at the PAT appointment. Patient was also instructed that they will need to review over the PAT instructions again at home before surgery.

## 2020-01-05 NOTE — Progress Notes (Signed)
Patient ID: Juan Hudson, male   DOB: 05/14/1946, 74 y.o.   MRN: [id number]  Patient comes in today requesting another pair of orthotics.  He was last seen in the office on May 25.  We made him some new orthotics at that time and he has found them to be comfortable.  He is requesting a second pair be made today for other shoes.  Physical exam is unchanged from exam on Dec 14, 2019  New custom orthotics were created today.  He found them to be comfortable prior to leaving the office.  Gait was neutral with orthotics in place.  He will follow-up as needed.  Patient was fitted for a : standard, cushioned, semi-rigid orthotic. The orthotic was heated and afterward the patient stood on the orthotic blank positioned on the orthotic stand. The patient was positioned in subtalar neutral position and 10 degrees of ankle dorsiflexion in a weight bearing stance. After completion of molding, a stable base was applied to the orthotic blank. The blank was ground to a stable position for weight bearing. Size: 11 Base: Blue EVA Posting: none Additional orthotic padding: Right: scaphoid pad and metatarsal cookie; Left: small MT pad

## 2020-01-06 NOTE — Progress Notes (Signed)
Anesthesia Chart Review:  Case: [id number] Date/Time: 01/11/20 0715   Procedures:      Lumbar 2-3 Lumbar 3-4 Anterolateral decompression/fusion (N/A )     Percutaneous pedicle screw fixation from Lumbar 2 to Lumbar 4 (N/A )     APPLICATION OF ROBOTIC ASSISTANCE FOR SPINAL PROCEDURE (N/A )   Anesthesia type: General   Pre-op diagnosis: Spondylolisthesis, Lumbar region   Location: [facility] OR ROOM 71 / [facility]   Surgeons: Kristeen Miss, MD      DISCUSSION: Patient is a 74 year old male scheduled for the above procedure.  History includes former smoker (quit 1966), HTN, OSA (s/p UPPP 2004; uses CPAP), HLD, systolic murmur (mild AR/AS 12/2018 echo), skin cancer, GERD, rosacea.  ASA on hold for > 2 weeks.   Presurgical COVID-19 test is scheduled for 01/07/2020.  Anesthesia team to evaluate on the day of surgery.   VS: BP 123/65   Pulse 95   Temp 36.9 C (Oral)   Resp 18   Ht 5\' 9"  (1.753 m)   Wt 83.5 kg   SpO2 98%   BMI 27.19 kg/m    PROVIDERS: Crist Infante, MD his PCP Baird Lyons, MD is pulmonologist ([facility]) -He is not routinely followed by cardiologist.  Previous evaluation by Romeo Apple, MD (now retired). Last few echocardiograms have been ordered by Dr. Joylene Draft (for mild AR/AS follow-up).   LABS: Labs reviewed: Acceptable for surgery. (all labs ordered are listed, but only abnormal results are displayed)  Labs Reviewed  BASIC METABOLIC PANEL - Abnormal; Notable for the following components:      Result Value   BUN 27 (*)    All other components within normal limits  CBC - Abnormal; Notable for the following components:   RDW 18.5 (*)    All other components within normal limits  SURGICAL PCR SCREEN  TYPE AND SCREEN  ABO/RH     IMAGES: CT L-spine 01/05/20: IMPRESSION: 1. Chronic 35% superior endplate compression fracture at L1, with large hemangioma filling most of the L2 vertebral body. 2. Osseous contribution to foraminal stenosis bilaterally at L2-3, L3-4, L4-5,  and L5-S1 due to facet arthropathy and intervertebral spurring. 3. Lucency in the L5 spinous process adjacent to the sclerotic L4-5 interspinous interface raising the possibility of Baastrup's disease. 4. Aortic atherosclerosis. Sigmoid colon diverticulosis.  CXR 02/23/19: IMPRESSION: Chronic lung changes and emphysema without evidence of acute cardiopulmonary disease   EKG: 01/05/20:  Normal sinus rhythm Left axis deviation Incomplete right bundle branch block Abnormal ECG - Overall, not significantly changed when compared to 03/23/15 tracing. LAD more prominent.    CV: Echo 12/25/18: IMPRESSIONS  1. The left ventricle has normal systolic function with an ejection  fraction of 60-65%. The cavity size was normal. Left ventricular diastolic  Doppler parameters are consistent with impaired relaxation.  2. The right ventricle has normal systolic function. The cavity was  mildly enlarged. There is no increase in right ventricular wall thickness.  3. The aortic valve is tricuspid. Mild thickening of the aortic valve.  Mild calcification of the aortic valve. Aortic valve regurgitation is mild  by color flow Doppler. Mild stenosis of the aortic valve.     Past Medical History:  Diagnosis Date  . Allergy   . Cancer ([place])    skin cancer  . Cataract    early  . GERD (gastroesophageal reflux disease)   . HLD (hyperlipidemia)   . Hypertension   . Rosacea   . Sleep apnea    cpap  .  Systolic murmur     Past Surgical History:  Procedure Laterality Date  . ABDOMINAL HERNIA REPAIR     x2  . COLONOSCOPY  04-01-2003   tics and hems  . HEMORRHOID SURGERY    . INGUINAL HERNIA REPAIR    . NOSE SURGERY    . TONSILLECTOMY    . uvuloplasty      MEDICATIONS: . aspirin 81 MG tablet  . B Complex Vitamins (B COMPLEX 100 PO)  . irbesartan-hydrochlorothiazide (AVALIDE) 300-12.5 MG tablet  . Multiple Vitamin (MULTIVITAMIN) tablet  . Omega-3 Fatty Acids (FISH OIL) 1000 MG CAPS  .  pravastatin (PRAVACHOL) 40 MG tablet  . propranolol (INDERAL) 10 MG tablet  . testosterone cypionate (DEPOTESTOSTERONE CYPIONATE) 200 MG/ML injection   No current facility-administered medications for this encounter.    Myra Gianotti, PA-C Surgical Short Stay/Anesthesiology [facility] Phone [phone] [facility] Phone [phone] 01/06/2020 1:39 PM

## 2020-01-06 NOTE — Anesthesia Preprocedure Evaluation (Addendum)
Anesthesia Evaluation  Patient identified by MRN, date of birth, ID band Patient awake    Reviewed: Allergy & Precautions, NPO status , Patient's Chart, lab work & pertinent test results, reviewed documented beta blocker date and time   History of Anesthesia Complications Negative for: history of anesthetic complications  Airway Mallampati: I  TM Distance: >3 FB Neck ROM: Full    Dental  (+) Teeth Intact, Dental Advisory Given   Pulmonary neg shortness of breath, sleep apnea and Continuous Positive Airway Pressure Ventilation , neg COPD, neg recent URI, former smoker,    breath sounds clear to auscultation       Cardiovascular hypertension, Pt. on medications and Pt. on home beta blockers (-) angina(-) Past MI and (-) CHF + Valvular Problems/Murmurs AI  Rhythm:Regular  1. The left ventricle has normal systolic function with an ejection  fraction of 60-65%. The cavity size was normal. Left ventricular diastolic  Doppler parameters are consistent with impaired relaxation.  2. The right ventricle has normal systolic function. The cavity was  mildly enlarged. There is no increase in right ventricular wall thickness.  3. The aortic valve is tricuspid. Mild thickening of the aortic valve.  Mild calcification of the aortic valve. Aortic valve regurgitation is mild  by color flow Doppler. Mild stenosis of the aortic valve.    Neuro/Psych  Neuromuscular disease negative psych ROS   GI/Hepatic Neg liver ROS, GERD  Controlled,  Endo/Other  negative endocrine ROS  Renal/GU negative Renal ROS     Musculoskeletal negative musculoskeletal ROS (+)   Abdominal   Peds  Hematology negative hematology ROS (+)   Anesthesia Other Findings   Reproductive/Obstetrics                             Anesthesia Physical Anesthesia Plan  ASA: II  Anesthesia Plan: General   Post-op Pain Management:    Induction:  Intravenous  PONV Risk Score and Plan: 2 and Ondansetron and Dexamethasone  Airway Management Planned: Oral ETT  Additional Equipment: None  Intra-op Plan:   Post-operative Plan: Extubation in OR  Informed Consent: I have reviewed the patients History and Physical, chart, labs and discussed the procedure including the risks, benefits and alternatives for the proposed anesthesia with the patient or authorized representative who has indicated his/her understanding and acceptance.     Dental advisory given  Plan Discussed with: CRNA and Surgeon  Anesthesia Plan Comments: (PAT note written 01/06/2020 by Myra Gianotti, PA-C. )       Anesthesia Quick Evaluation

## 2020-01-07 ENCOUNTER — Other Ambulatory Visit (HOSPITAL_COMMUNITY)
Admission: RE | Admit: 2020-01-07 | Discharge: 2020-01-07 | Disposition: A | Discharge status: Home / Self Care | Payer: PPO | Source: Ambulatory Visit | Attending: Neurological Surgery | Admitting: Neurological Surgery

## 2020-01-07 DIAGNOSIS — Z01812 Encounter for preprocedural laboratory examination: Secondary | ICD-10-CM | POA: Insufficient documentation

## 2020-01-07 DIAGNOSIS — Z20822 Contact with and (suspected) exposure to covid-19: Secondary | ICD-10-CM | POA: Diagnosis not present

## 2020-01-07 LAB — SARS CORONAVIRUS 2 (TAT 6-24 HRS): SARS Coronavirus 2: NEGATIVE

## 2020-01-11 ENCOUNTER — Inpatient Hospital Stay (HOSPITAL_COMMUNITY): Payer: PPO | Admitting: Anesthesiology

## 2020-01-11 ENCOUNTER — Encounter (HOSPITAL_COMMUNITY): Payer: Self-pay | Admitting: Neurological Surgery

## 2020-01-11 ENCOUNTER — Inpatient Hospital Stay (HOSPITAL_COMMUNITY)
Admission: RE | Admit: 2020-01-11 | Discharge: 2020-01-12 | DRG: 460 | Disposition: A | Discharge status: Home / Self Care | Payer: PPO | Attending: Neurological Surgery | Admitting: Neurological Surgery

## 2020-01-11 ENCOUNTER — Inpatient Hospital Stay (HOSPITAL_COMMUNITY): Payer: PPO | Admitting: Vascular Surgery

## 2020-01-11 ENCOUNTER — Encounter (HOSPITAL_COMMUNITY)
Admission: RE | Disposition: A | Discharge status: Home / Self Care | Payer: Self-pay | Source: Home / Self Care | Attending: Neurological Surgery

## 2020-01-11 ENCOUNTER — Inpatient Hospital Stay (HOSPITAL_COMMUNITY): Payer: PPO

## 2020-01-11 ENCOUNTER — Other Ambulatory Visit: Payer: Self-pay

## 2020-01-11 DIAGNOSIS — Z7982 Long term (current) use of aspirin: Secondary | ICD-10-CM | POA: Diagnosis not present

## 2020-01-11 DIAGNOSIS — M4726 Other spondylosis with radiculopathy, lumbar region: Secondary | ICD-10-CM | POA: Diagnosis not present

## 2020-01-11 DIAGNOSIS — Z8249 Family history of ischemic heart disease and other diseases of the circulatory system: Secondary | ICD-10-CM | POA: Diagnosis not present

## 2020-01-11 DIAGNOSIS — G4733 Obstructive sleep apnea (adult) (pediatric): Secondary | ICD-10-CM | POA: Diagnosis present

## 2020-01-11 DIAGNOSIS — Z85828 Personal history of other malignant neoplasm of skin: Secondary | ICD-10-CM

## 2020-01-11 DIAGNOSIS — Z87891 Personal history of nicotine dependence: Secondary | ICD-10-CM

## 2020-01-11 DIAGNOSIS — I10 Essential (primary) hypertension: Secondary | ICD-10-CM | POA: Diagnosis not present

## 2020-01-11 DIAGNOSIS — M5116 Intervertebral disc disorders with radiculopathy, lumbar region: Secondary | ICD-10-CM | POA: Diagnosis not present

## 2020-01-11 DIAGNOSIS — Z79890 Hormone replacement therapy (postmenopausal): Secondary | ICD-10-CM | POA: Diagnosis not present

## 2020-01-11 DIAGNOSIS — Z419 Encounter for procedure for purposes other than remedying health state, unspecified: Secondary | ICD-10-CM

## 2020-01-11 DIAGNOSIS — M5416 Radiculopathy, lumbar region: Secondary | ICD-10-CM | POA: Diagnosis not present

## 2020-01-11 DIAGNOSIS — Z79899 Other long term (current) drug therapy: Secondary | ICD-10-CM

## 2020-01-11 DIAGNOSIS — E785 Hyperlipidemia, unspecified: Secondary | ICD-10-CM | POA: Diagnosis not present

## 2020-01-11 DIAGNOSIS — K219 Gastro-esophageal reflux disease without esophagitis: Secondary | ICD-10-CM | POA: Diagnosis not present

## 2020-01-11 DIAGNOSIS — Z4889 Encounter for other specified surgical aftercare: Secondary | ICD-10-CM | POA: Diagnosis not present

## 2020-01-11 DIAGNOSIS — Z885 Allergy status to narcotic agent status: Secondary | ICD-10-CM

## 2020-01-11 DIAGNOSIS — M4316 Spondylolisthesis, lumbar region: Secondary | ICD-10-CM | POA: Diagnosis not present

## 2020-01-11 DIAGNOSIS — Z189 Retained foreign body fragments, unspecified material: Secondary | ICD-10-CM | POA: Diagnosis not present

## 2020-01-11 DIAGNOSIS — H269 Unspecified cataract: Secondary | ICD-10-CM | POA: Diagnosis present

## 2020-01-11 DIAGNOSIS — Z981 Arthrodesis status: Secondary | ICD-10-CM | POA: Diagnosis not present

## 2020-01-11 DIAGNOSIS — M48062 Spinal stenosis, lumbar region with neurogenic claudication: Secondary | ICD-10-CM | POA: Diagnosis not present

## 2020-01-11 HISTORY — PX: LUMBAR PERCUTANEOUS PEDICLE SCREW 2 LEVEL: SHX5561

## 2020-01-11 HISTORY — PX: APPLICATION OF ROBOTIC ASSISTANCE FOR SPINAL PROCEDURE: SHX6753

## 2020-01-11 HISTORY — PX: ANTERIOR LAT LUMBAR FUSION: SHX1168

## 2020-01-11 SURGERY — ANTERIOR LATERAL LUMBAR FUSION 2 LEVELS
Anesthesia: General | Site: Spine Lumbar

## 2020-01-11 MED ORDER — MORPHINE SULFATE (PF) 2 MG/ML IV SOLN: 2.0000 mg | INTRAVENOUS | Status: DC | PRN

## 2020-01-11 MED ORDER — METHOCARBAMOL 1000 MG/10ML IJ SOLN
500.0000 mg | Freq: Four times a day (QID) | INTRAVENOUS | Status: DC | PRN
  Filled 2020-01-11: qty 5

## 2020-01-11 MED ORDER — THROMBIN 5000 UNITS EX SOLR
OROMUCOSAL | Status: DC | PRN
  Administered 2020-01-11: 5 mL via TOPICAL

## 2020-01-11 MED ORDER — DEXAMETHASONE SODIUM PHOSPHATE 4 MG/ML IJ SOLN
INTRAMUSCULAR | Status: DC | PRN
  Administered 2020-01-11: 10 mg via INTRAVENOUS

## 2020-01-11 MED ORDER — FENTANYL CITRATE (PF) 100 MCG/2ML IJ SOLN
INTRAMUSCULAR | Status: DC | PRN
  Administered 2020-01-11: 50 ug via INTRAVENOUS
  Administered 2020-01-11 (×2): 100 ug via INTRAVENOUS
  Administered 2020-01-11 (×3): 50 ug via INTRAVENOUS

## 2020-01-11 MED ORDER — DEXAMETHASONE SODIUM PHOSPHATE 10 MG/ML IJ SOLN
INTRAMUSCULAR | Status: AC
  Filled 2020-01-11: qty 1

## 2020-01-11 MED ORDER — POLYETHYLENE GLYCOL 3350 17 G PO PACK
17.0000 g | PACK | Freq: Every day | ORAL | Status: DC | PRN
  Administered 2020-01-11: 17 g via ORAL
  Filled 2020-01-11: qty 1

## 2020-01-11 MED ORDER — ACETAMINOPHEN 10 MG/ML IV SOLN
INTRAVENOUS | Status: AC
  Filled 2020-01-11: qty 100

## 2020-01-11 MED ORDER — SODIUM CHLORIDE 0.9% FLUSH: 3.0000 mL | INTRAVENOUS | Status: DC | PRN

## 2020-01-11 MED ORDER — BUPIVACAINE HCL (PF) 0.5 % IJ SOLN
INTRAMUSCULAR | Status: DC | PRN
  Administered 2020-01-11: 3.5 mL
  Administered 2020-01-11: 5 mL

## 2020-01-11 MED ORDER — KETOROLAC TROMETHAMINE 15 MG/ML IJ SOLN
INTRAMUSCULAR | Status: AC
  Filled 2020-01-11: qty 1

## 2020-01-11 MED ORDER — IRBESARTAN 300 MG PO TABS
300.0000 mg | ORAL_TABLET | Freq: Every day | ORAL | Status: DC
  Administered 2020-01-11: 300 mg via ORAL
  Filled 2020-01-11 (×2): qty 1

## 2020-01-11 MED ORDER — ACETAMINOPHEN 650 MG RE SUPP: 650.0000 mg | RECTAL | Status: DC | PRN

## 2020-01-11 MED ORDER — OXYCODONE HCL 5 MG PO TABS: 5.0000 mg | ORAL_TABLET | Freq: Once | ORAL | Status: DC | PRN

## 2020-01-11 MED ORDER — ACETAMINOPHEN 500 MG PO TABS: 1000.0000 mg | ORAL_TABLET | Freq: Once | ORAL | Status: DC | PRN

## 2020-01-11 MED ORDER — PROPOFOL 500 MG/50ML IV EMUL
INTRAVENOUS | Status: DC | PRN
Start: 2020-01-11 — End: 2020-01-11
  Administered 2020-01-11: 40 ug/kg/min via INTRAVENOUS

## 2020-01-11 MED ORDER — METHOCARBAMOL 500 MG PO TABS
500.0000 mg | ORAL_TABLET | Freq: Four times a day (QID) | ORAL | Status: DC | PRN
  Administered 2020-01-11 – 2020-01-12 (×3): 500 mg via ORAL
  Filled 2020-01-11 (×3): qty 1

## 2020-01-11 MED ORDER — ACETAMINOPHEN 160 MG/5ML PO SOLN: 1000.0000 mg | Freq: Once | ORAL | Status: DC | PRN

## 2020-01-11 MED ORDER — IRBESARTAN-HYDROCHLOROTHIAZIDE 300-12.5 MG PO TABS: 1.0000 | ORAL_TABLET | Freq: Every day | ORAL | Status: DC

## 2020-01-11 MED ORDER — PROPRANOLOL HCL 10 MG PO TABS
10.0000 mg | ORAL_TABLET | Freq: Every day | ORAL | Status: DC | PRN
  Filled 2020-01-11: qty 1

## 2020-01-11 MED ORDER — LIDOCAINE-EPINEPHRINE 1 %-1:100000 IJ SOLN
INTRAMUSCULAR | Status: AC
  Filled 2020-01-11: qty 1

## 2020-01-11 MED ORDER — EPHEDRINE SULFATE-NACL 50-0.9 MG/10ML-% IV SOSY
PREFILLED_SYRINGE | INTRAVENOUS | Status: DC | PRN
  Administered 2020-01-11: 10 mg via INTRAVENOUS

## 2020-01-11 MED ORDER — PRAVASTATIN SODIUM 40 MG PO TABS: 40.0000 mg | ORAL_TABLET | Freq: Every day | ORAL | Status: DC

## 2020-01-11 MED ORDER — BUPIVACAINE HCL (PF) 0.5 % IJ SOLN
INTRAMUSCULAR | Status: AC
  Filled 2020-01-11: qty 30

## 2020-01-11 MED ORDER — FLEET ENEMA 7-19 GM/118ML RE ENEM: 1.0000 | ENEMA | Freq: Once | RECTAL | Status: DC | PRN

## 2020-01-11 MED ORDER — PROPOFOL 1000 MG/100ML IV EMUL
INTRAVENOUS | Status: AC
  Filled 2020-01-11: qty 100

## 2020-01-11 MED ORDER — SENNA 8.6 MG PO TABS
1.0000 | ORAL_TABLET | Freq: Two times a day (BID) | ORAL | Status: DC
  Filled 2020-01-11: qty 1

## 2020-01-11 MED ORDER — LIDOCAINE 2% (20 MG/ML) 5 ML SYRINGE
INTRAMUSCULAR | Status: DC | PRN
  Administered 2020-01-11: 80 mg via INTRAVENOUS

## 2020-01-11 MED ORDER — BISACODYL 10 MG RE SUPP: 10.0000 mg | Freq: Every day | RECTAL | Status: DC | PRN

## 2020-01-11 MED ORDER — LIDOCAINE-EPINEPHRINE 1 %-1:100000 IJ SOLN
INTRAMUSCULAR | Status: DC | PRN
  Administered 2020-01-11: 5 mL
  Administered 2020-01-11: 3.5 mL

## 2020-01-11 MED ORDER — CEFAZOLIN SODIUM-DEXTROSE 2-4 GM/100ML-% IV SOLN
2.0000 g | Freq: Three times a day (TID) | INTRAVENOUS | Status: AC
  Administered 2020-01-11 (×2): 2 g via INTRAVENOUS
  Filled 2020-01-11 (×2): qty 100

## 2020-01-11 MED ORDER — ALUM & MAG HYDROXIDE-SIMETH 200-200-20 MG/5ML PO SUSP: 30.0000 mL | Freq: Four times a day (QID) | ORAL | Status: DC | PRN

## 2020-01-11 MED ORDER — 0.9 % SODIUM CHLORIDE (POUR BTL) OPTIME
TOPICAL | Status: DC | PRN
  Administered 2020-01-11: 1000 mL

## 2020-01-11 MED ORDER — ORAL CARE MOUTH RINSE: 15.0000 mL | Freq: Once | OROMUCOSAL | Status: AC

## 2020-01-11 MED ORDER — CHLORHEXIDINE GLUCONATE CLOTH 2 % EX PADS: 6.0000 | MEDICATED_PAD | Freq: Once | CUTANEOUS | Status: DC

## 2020-01-11 MED ORDER — CHLORHEXIDINE GLUCONATE 0.12 % MT SOLN
15.0000 mL | Freq: Once | OROMUCOSAL | Status: AC
  Administered 2020-01-11: 15 mL via OROMUCOSAL
  Filled 2020-01-11: qty 15

## 2020-01-11 MED ORDER — OXYCODONE HCL 5 MG/5ML PO SOLN: 5.0000 mg | Freq: Once | ORAL | Status: DC | PRN

## 2020-01-11 MED ORDER — THROMBIN 5000 UNITS EX SOLR
CUTANEOUS | Status: AC
  Filled 2020-01-11: qty 5000

## 2020-01-11 MED ORDER — SUCCINYLCHOLINE CHLORIDE 20 MG/ML IJ SOLN
INTRAMUSCULAR | Status: DC | PRN
  Administered 2020-01-11: 120 mg via INTRAVENOUS

## 2020-01-11 MED ORDER — PHENYLEPHRINE HCL-NACL 10-0.9 MG/250ML-% IV SOLN
INTRAVENOUS | Status: DC | PRN
  Administered 2020-01-11: 50 ug/min via INTRAVENOUS

## 2020-01-11 MED ORDER — DOCUSATE SODIUM 100 MG PO CAPS
100.0000 mg | ORAL_CAPSULE | Freq: Two times a day (BID) | ORAL | Status: DC
  Administered 2020-01-11: 100 mg via ORAL
  Filled 2020-01-11: qty 1

## 2020-01-11 MED ORDER — SODIUM CHLORIDE 0.9 % IV SOLN
INTRAVENOUS | Status: DC | PRN
  Administered 2020-01-11: 500 mL

## 2020-01-11 MED ORDER — CEFAZOLIN SODIUM-DEXTROSE 2-4 GM/100ML-% IV SOLN
2.0000 g | INTRAVENOUS | Status: AC
  Administered 2020-01-11: 2 g via INTRAVENOUS
  Filled 2020-01-11: qty 100

## 2020-01-11 MED ORDER — PROPOFOL 10 MG/ML IV BOLUS
INTRAVENOUS | Status: DC | PRN
  Administered 2020-01-11: 120 mg via INTRAVENOUS
  Administered 2020-01-11: 50 mg via INTRAVENOUS

## 2020-01-11 MED ORDER — MENTHOL 3 MG MT LOZG: 1.0000 | LOZENGE | OROMUCOSAL | Status: DC | PRN

## 2020-01-11 MED ORDER — SODIUM CHLORIDE 0.9 % IV SOLN
250.0000 mL | INTRAVENOUS | Status: DC
  Administered 2020-01-11: 250 mL via INTRAVENOUS

## 2020-01-11 MED ORDER — CEFAZOLIN SODIUM-DEXTROSE 2-3 GM-%(50ML) IV SOLR
INTRAVENOUS | Status: DC | PRN
Start: 2020-01-11 — End: 2020-01-11
  Administered 2020-01-11: 2 g via INTRAVENOUS

## 2020-01-11 MED ORDER — SODIUM CHLORIDE 0.9% FLUSH
3.0000 mL | Freq: Two times a day (BID) | INTRAVENOUS | Status: DC
  Administered 2020-01-11: 3 mL via INTRAVENOUS

## 2020-01-11 MED ORDER — FENTANYL CITRATE (PF) 100 MCG/2ML IJ SOLN
25.0000 ug | INTRAMUSCULAR | Status: DC | PRN
  Administered 2020-01-11 (×2): 25 ug via INTRAVENOUS

## 2020-01-11 MED ORDER — PHENYLEPHRINE 40 MCG/ML (10ML) SYRINGE FOR IV PUSH (FOR BLOOD PRESSURE SUPPORT)
PREFILLED_SYRINGE | INTRAVENOUS | Status: DC | PRN
  Administered 2020-01-11: 120 ug via INTRAVENOUS
  Administered 2020-01-11 (×2): 80 ug via INTRAVENOUS

## 2020-01-11 MED ORDER — OXYCODONE-ACETAMINOPHEN 5-325 MG PO TABS
1.0000 | ORAL_TABLET | ORAL | Status: DC | PRN
  Administered 2020-01-11 – 2020-01-12 (×4): 2 via ORAL
  Filled 2020-01-11 (×4): qty 2

## 2020-01-11 MED ORDER — ONDANSETRON HCL 4 MG PO TABS: 4.0000 mg | ORAL_TABLET | Freq: Four times a day (QID) | ORAL | Status: DC | PRN

## 2020-01-11 MED ORDER — FENTANYL CITRATE (PF) 100 MCG/2ML IJ SOLN
INTRAMUSCULAR | Status: AC
  Filled 2020-01-11: qty 2

## 2020-01-11 MED ORDER — ONDANSETRON HCL 4 MG/2ML IJ SOLN: 4.0000 mg | Freq: Four times a day (QID) | INTRAMUSCULAR | Status: DC | PRN

## 2020-01-11 MED ORDER — KETOROLAC TROMETHAMINE 15 MG/ML IJ SOLN
7.5000 mg | Freq: Four times a day (QID) | INTRAMUSCULAR | Status: AC
  Administered 2020-01-11 – 2020-01-12 (×4): 7.5 mg via INTRAVENOUS
  Filled 2020-01-11 (×2): qty 1

## 2020-01-11 MED ORDER — PROPOFOL 10 MG/ML IV BOLUS
INTRAVENOUS | Status: AC
  Filled 2020-01-11: qty 40

## 2020-01-11 MED ORDER — FENTANYL CITRATE (PF) 250 MCG/5ML IJ SOLN
INTRAMUSCULAR | Status: AC
  Filled 2020-01-11: qty 5

## 2020-01-11 MED ORDER — ONDANSETRON HCL 4 MG/2ML IJ SOLN
INTRAMUSCULAR | Status: DC | PRN
  Administered 2020-01-11: 4 mg via INTRAVENOUS

## 2020-01-11 MED ORDER — PHENOL 1.4 % MT LIQD: 1.0000 | OROMUCOSAL | Status: DC | PRN

## 2020-01-11 MED ORDER — LACTATED RINGERS IV SOLN
INTRAVENOUS | Status: DC | PRN
Start: 2020-01-11 — End: 2020-01-11

## 2020-01-11 MED ORDER — ACETAMINOPHEN 10 MG/ML IV SOLN
1000.0000 mg | Freq: Once | INTRAVENOUS | Status: DC | PRN
  Administered 2020-01-11: 1000 mg via INTRAVENOUS

## 2020-01-11 MED ORDER — ACETAMINOPHEN 325 MG PO TABS: 650.0000 mg | ORAL_TABLET | ORAL | Status: DC | PRN

## 2020-01-11 MED ORDER — HYDROCHLOROTHIAZIDE 12.5 MG PO CAPS
12.5000 mg | ORAL_CAPSULE | Freq: Every day | ORAL | Status: DC
  Administered 2020-01-11: 12.5 mg via ORAL
  Filled 2020-01-11: qty 1

## 2020-01-11 SURGICAL SUPPLY — 89 items
ADH SKN CLS APL DERMABOND .7 (GAUZE/BANDAGES/DRESSINGS) ×3
BAG DECANTER FOR FLEXI CONT (MISCELLANEOUS) ×3 IMPLANT
BIT DRILL LONG 3.0X30 (BIT) IMPLANT
BIT DRILL LONG 3.0X30MM (BIT)
BIT DRILL LONG 3X80 (BIT) IMPLANT
BIT DRILL LONG 3X80MM (BIT)
BIT DRILL LONG 4X80 (BIT) IMPLANT
BIT DRILL LONG 4X80MM (BIT)
BIT DRILL SHORT 3.0X30 (BIT) IMPLANT
BIT DRILL SHORT 3.0X30MM (BIT)
BIT DRILL SHORT 3X80 (BIT) IMPLANT
BIT DRILL SHORT 3X80MM (BIT)
BLADE CLIPPER SURG (BLADE) IMPLANT
BLADE SURG 11 STRL SS (BLADE) ×1 IMPLANT
BONE MATRIX OSTEOCEL PRO LRG (Bone Implant) ×4 IMPLANT
CARTRIDGE OIL MAESTRO DRILL (MISCELLANEOUS) ×1 IMPLANT
CLIP NEUROVISION LG (CLIP) ×2 IMPLANT
CNTNR URN SCR LID CUP LEK RST (MISCELLANEOUS) ×1 IMPLANT
CONT SPEC 4OZ STRL OR WHT (MISCELLANEOUS) ×3
COVER BACK TABLE 24X17X13 BIG (DRAPES) IMPLANT
COVER BACK TABLE 60X90IN (DRAPES) ×3 IMPLANT
COVER WAND RF STERILE (DRAPES) ×3 IMPLANT
DERMABOND ADVANCED (GAUZE/BANDAGES/DRESSINGS) ×6
DERMABOND ADVANCED .7 DNX12 (GAUZE/BANDAGES/DRESSINGS) ×3 IMPLANT
DIFFUSER DRILL AIR PNEUMATIC (MISCELLANEOUS) ×1 IMPLANT
DRAPE C-ARM 42X72 X-RAY (DRAPES) ×6 IMPLANT
DRAPE C-ARMOR (DRAPES) ×6 IMPLANT
DRAPE LAPAROTOMY 100X72X124 (DRAPES) ×6 IMPLANT
DRAPE SHEET LG 3/4 BI-LAMINATE (DRAPES) ×1 IMPLANT
DURAPREP 26ML APPLICATOR (WOUND CARE) ×6 IMPLANT
ELECT BLADE 4.0 EZ CLEAN MEGAD (MISCELLANEOUS) ×3
ELECT REM PT RETURN 9FT ADLT (ELECTROSURGICAL) ×6
ELECTRODE BLDE 4.0 EZ CLN MEGD (MISCELLANEOUS) IMPLANT
ELECTRODE REM PT RTRN 9FT ADLT (ELECTROSURGICAL) ×2 IMPLANT
GAUZE 4X4 16PLY RFD (DISPOSABLE) IMPLANT
GLOVE BIO SURGEON STRL SZ 6.5 (GLOVE) ×4 IMPLANT
GLOVE BIO SURGEON STRL SZ7.5 (GLOVE) ×2 IMPLANT
GLOVE BIO SURGEONS STRL SZ 6.5 (GLOVE) ×4
GLOVE BIOGEL PI IND STRL 7.0 (GLOVE) IMPLANT
GLOVE BIOGEL PI IND STRL 7.5 (GLOVE) IMPLANT
GLOVE BIOGEL PI IND STRL 8.5 (GLOVE) ×3 IMPLANT
GLOVE BIOGEL PI INDICATOR 7.0 (GLOVE) ×2
GLOVE BIOGEL PI INDICATOR 7.5 (GLOVE) ×2
GLOVE BIOGEL PI INDICATOR 8.5 (GLOVE) ×4
GLOVE ECLIPSE 7.0 STRL STRAW (GLOVE) ×2 IMPLANT
GLOVE ECLIPSE 8.5 STRL (GLOVE) ×7 IMPLANT
GLOVE EXAM NITRILE XL STR (GLOVE) IMPLANT
GOWN STRL REUS W/ TWL LRG LVL3 (GOWN DISPOSABLE) IMPLANT
GOWN STRL REUS W/ TWL XL LVL3 (GOWN DISPOSABLE) ×3 IMPLANT
GOWN STRL REUS W/TWL 2XL LVL3 (GOWN DISPOSABLE) ×6 IMPLANT
GOWN STRL REUS W/TWL LRG LVL3 (GOWN DISPOSABLE) ×9
GOWN STRL REUS W/TWL XL LVL3 (GOWN DISPOSABLE)
GUIDEWIRE NITINOL BEVEL TIP (WIRE) ×12 IMPLANT
HEMOSTAT POWDER KIT SURGIFOAM (HEMOSTASIS) ×2 IMPLANT
KIT BASIN OR (CUSTOM PROCEDURE TRAY) ×6 IMPLANT
KIT DILATOR XLIF 5 (KITS) IMPLANT
KIT SPINE MAZOR X ROBO DISP (MISCELLANEOUS) ×3 IMPLANT
KIT SURGICAL ACCESS MAXCESS 4 (KITS) ×2 IMPLANT
KIT TURNOVER KIT B (KITS) ×4 IMPLANT
KIT XLIF (KITS) ×2
MARKER SKIN DUAL TIP RULER LAB (MISCELLANEOUS) ×3 IMPLANT
MODULE NVM5 NEXT GEN EMG (NEEDLE) ×2 IMPLANT
MODULUS XLW 12X22X55MM 10 (Spine Construct) ×4 IMPLANT
NDL HYPO 25X1 1.5 SAFETY (NEEDLE) ×2 IMPLANT
NEEDLE HYPO 25X1 1.5 SAFETY (NEEDLE) ×6 IMPLANT
NS IRRIG 1000ML POUR BTL (IV SOLUTION) ×4 IMPLANT
OIL CARTRIDGE MAESTRO DRILL (MISCELLANEOUS)
PACK LAMINECTOMY NEURO (CUSTOM PROCEDURE TRAY) ×6 IMPLANT
PAD ARMBOARD 7.5X6 YLW CONV (MISCELLANEOUS) ×5 IMPLANT
PIN HEAD 2.5X60MM (PIN) IMPLANT
ROD RELINE MAS LORD 5.5X85MM (Rod) ×2 IMPLANT
ROD SPINAL 5.5X80 TI LORDOSE (Rod) ×2 IMPLANT
SCREW LOCK RELINE 5.5 TULIP (Screw) ×12 IMPLANT
SCREW RELINE RED 5.5X50MM (Screw) ×4 IMPLANT
SCREW RELINE RED 6.5X45MM POLY (Screw) ×4 IMPLANT
SCREW RELINE RED 6.5X50MM POLY (Screw) ×4 IMPLANT
SCREW SCHANZ SA 4.0MM (MISCELLANEOUS) IMPLANT
SPONGE LAP 4X18 RFD (DISPOSABLE) IMPLANT
SUT VIC AB 1 CT1 18XBRD ANBCTR (SUTURE) ×2 IMPLANT
SUT VIC AB 1 CT1 8-18 (SUTURE) ×6
SUT VIC AB 2-0 CP2 18 (SUTURE) ×6 IMPLANT
SUT VIC AB 3-0 SH 8-18 (SUTURE) ×7 IMPLANT
SUT VIC AB 4-0 RB1 18 (SUTURE) ×6 IMPLANT
TAPE CLOTH 4X10 WHT NS (GAUZE/BANDAGES/DRESSINGS) ×2 IMPLANT
TOWEL GREEN STERILE (TOWEL DISPOSABLE) ×4 IMPLANT
TOWEL GREEN STERILE FF (TOWEL DISPOSABLE) ×4 IMPLANT
TRAY FOLEY MTR SLVR 16FR STAT (SET/KITS/TRAYS/PACK) ×4 IMPLANT
TUBE MAZOR SA REDUCTION (TUBING) ×3 IMPLANT
WATER STERILE IRR 1000ML POUR (IV SOLUTION) ×4 IMPLANT

## 2020-01-11 NOTE — Progress Notes (Signed)
Orthopedic Tech Progress Note Patient Details:  Juan Hudson July 16, 1946 [id number] Ordered patient brace Patient ID: [name], male   DOB: 1946/04/23, 74 y.o.   MRN: [id number]   [name] 01/11/2020, 1:56 PM

## 2020-01-11 NOTE — Op Note (Signed)
Date of surgery: 01/11/2020 Preoperative diagnosis: Lumbar stenosis L2-3 and L3-4 with neurogenic claudication, lumbar radiculopathy, history of L1 fracture Postoperative diagnosis: Same Procedure: Anterolateral decompression L2-3 and L3-4 using XLIF technique and XLIF titanium spacer allograft arthrodesis with osteocell L2-3 L3-4.  Posterior segmental fixation with pedicle screws placed with robotic assistance percutaneously with rod fixation L2-L4.  Fluoroscopic and robotic image guidance, EMG neuro monitoring. Surgeon: Kristeen Miss First Assistant: Consuella Lose MD Anesthesia: General endotracheal Indications: Deckard Stuber is a 74 year old individuals had progressive difficulty with his gait in addition to back pain and generalized leg weakness with symptoms of neurogenic claudication.  An MRI demonstrates that he has had advanced spondylitic stenosis at L2-3 and L3-4 he was advised regarding surgical decompression using an indirect technique at L2-3 and L3-4 with an XLIF spacer allograft arthrodesis and pedicle screw fixation.  Procedure the patient was brought to the operating room supine on the stretcher.  After the smooth induction of general endotracheal anesthesia and placement of a Foley catheter he was placed into the operating table in the right lateral decubitus position.  His hips were taped into this position with a circumferential tight band and fluoroscopic verification of orthogonal positioning was obtained.  When this was verified the chest wall was taped in the position as were the lower extremities with appropriate padding for all the bony prominences.  EMG monitors had been connected to the major muscular groups from L1-S1 for continuous EMG monitoring.  The break in the table was adjusted so to recreate a normal alignment and open the interspaces on the left side at L2-3 and L3-4.  When this was all verified the skin was marked on the lateral aspect and then the skin was cleansed  with alcohol DuraPrep and draped in a sterile fashion.  The chosen entry site was then opened and the dissection was carried down to the superficial fascia.  A second guide incision was then created and will mid lumbar spine and this allowed passage of a finger into the retroperitoneal space that then guided the lateral probe into the retroperitoneal space and into the psoas muscle.  The guide tube was anchored at L3-4 first and when adequately positioned and EMG monitoring was performed to make sure that no elements of the lumbar plexus were being irritated.  When this was verified a K wire was passed into the interspace at L3-4 a series of dilators was then passed over the initial probe to dilate to a 15 mm size all the time checking EMG monitoring for any irritability in the lumbar plexus none was noted.  130 mm retractor blade was used then to maintain retraction over the disc space at the L3-4 level.  Area was then dilated and the posterior aspect was checked for any EMG activity to allow placement of a shim into the disc space at L3-4 this was tapped and under fluoroscopic guidance and then of the lateral aspect of the disc space was carefully inspected a #15 blade was then used to open the lateral aspect of the disc and into the disc space series of Cobb elevators were then used to pass across the disc space to open the contralateral ligamentous connection between L3 and L4 once this was opened a series of curettes and rongeurs was used to evacuate a substantial quantity of severely degenerated desiccated disc material once the disc space was reasonably cleared a dilator measuring 8 mm tall was placed followed by a second 10 mm tall dilator then a 10  mm x 22 mm dilator was placed across the interspace ultimately the interspace was dilated to 12 mm in height and this was felt to reduce the retrolisthesis and create a snug fit for the interspace spacer further decortication of the endplates was performed to  make sure that all the cartilaginous material was removed from the endplates.  Once this final size was chosen a 22 x 12 mm x 55 mm titanium spacer with 10 degrees of lordosis was packed with osteocell allograft and then tamped under fluoroscopic visualization into the interspace.  When it was positioned correctly it was released and ultimately the retractor was removed from the L3-4 space.  The same procedure was then repeated at L2-3 again placing an initial probe with EMG monitoring into the L2-3 interspace.  The only difference at L2-3 was that there was a large left lateral osteophyte that required removal.  Care was taken to make sure hemostasis from the surrounding bony area was obtained.  This was verified and the interspace was entered and the same series of dilators was used to create an increase the size of the disc space.  Here also was felt that a 12 mm tall interbody spacer would restore the lordosis and remove the retrolisthesis that had been present at L2-L3.  A 12 x 22 x 55 mm spacer with 10 degrees of lordosis was ultimately chosen and this was tamped into the interspace after being filled with ostia cell allograft.  Once x-rays verified good positioning the retractor was removed the wound was checked for hemostasis the deep fascia was closed with 2-0 Vicryl in interrupted fashion 3-0 Vicryl was used in the subcutaneous tissues and 4-0 Vicryl subcuticularly in both the lateral and the posterior incisions.  During this procedure Dr. Kathyrn Sheriff helped with verification of the placement of the interbody grafts and also retraction during the final insertion of the graft.  Next attention was turned to the posterior fixation.  The patient was turned from the lateral decubitus position into the prone position on a Jackson table with the bony prominences being appropriately padded and protected.  The back was then prepped with alcohol DuraPrep and draped in a sterile fashion.  The robotic arm was  attached to the [place] table and the surgery was started by placing a Steinmann pin in the right posterior suprailiac crest.  With the robotic arm attached the area was draped sterilely and the initial registration process was completed obtaining registration films in the AP and oblique configurations to allow the computer to register the patient's spine with the CT scan that was used for planning purposes.  Preoperatively a plan for placement of screws at L2-L3 and L4 was reviewed and verified in this plane was then used by the robot for placement of screws at L2-L3 and L4.  The robot allowed the placement of the K wires into the spaces initially and at L2 we placed 5.5 x 50 mm screws at L3 6.5 x 50 mm screws and at L4 6.5 x 45 mm screws.  Once the K wires were placed initially EMG monitoring was performed during placement of the screws through the pedicle to make sure that there is no evidence of any breach of the pedicular wall and no irritability in the lumbar plexus.  Once this was verified we obtain some radiographs with the K wires in place to make sure that there positioning was good and when this was verified the screws were placed without incident.  Then rods were  measured and on the right  side an 85 mm by 5.5 mm precontoured rod was used and on the left side and 80 mm rod that was precontoured was  used.  These were fixed in a neutral construct.  Final radiographs identified good restoration of lordosis across the segment and good alignment in the AP and lateral projections.  With this the Steinmann pin was removed and the incisions were closed with 3-0 and 4-0 Vicryl in interrupted fashion.  Dermabond was placed on the skin.  Blood loss for this portion of procedure and the entire procedure from start to finish was estimated at less than 75 cc.  Patient was returned to recovery room in stable condition

## 2020-01-11 NOTE — H&P (Signed)
Juan Hudson is an 74 y.o. male.   Chief Complaint: Back pain bilateral leg pain and weakness HPI: Juan Hudson is a 74 year old individual who has had significant problems with neck pain and bilateral lower extremity pain and discomfort motor function has been deteriorating recently and he has difficulty walking even short distances to maintain activities of daily living.  An MRI demonstrates that he has a old fracture of the L1 vertebrae subsequently has developed degenerative changes at L2-3 and L3-4 with a retrolisthesis and a high-grade stenosis at each of these levels.  Having failed efforts at conservative treatment therapy he is now taken to the operating room to undergo surgical decompression with stabilization using an anterolateral technique at L2-3 and L3-4 posterior fixation robot-assisted placement of pedicle screws from L2-L4.  Past Medical History:  Diagnosis Date  . Allergy   . Cancer ([place])    skin cancer  . Cataract    early  . GERD (gastroesophageal reflux disease)   . HLD (hyperlipidemia)   . Hypertension   . Rosacea   . Sleep apnea    cpap  . Systolic murmur     Past Surgical History:  Procedure Laterality Date  . ABDOMINAL HERNIA REPAIR     x2  . COLONOSCOPY  04-01-2003   tics and hems  . HEMORRHOID SURGERY    . INGUINAL HERNIA REPAIR    . NOSE SURGERY    . TONSILLECTOMY    . uvuloplasty      Family History  Problem Relation Age of Onset  . Heart attack Father        pacemaker  . Dementia Mother   . Colon cancer Neg Hx   . Rectal cancer Neg Hx   . Stomach cancer Neg Hx    Social History:  reports that he quit smoking about 55 years ago. His smoking use included cigarettes. He has never used smokeless tobacco. He reports current alcohol use. He reports that he does not use drugs.  Allergies:  Allergies  Allergen Reactions  . Codeine Nausea Only    Medications Prior to Admission  Medication Sig Dispense Refill  . aspirin 81 MG tablet Take 81  mg by mouth at bedtime.     . B Complex Vitamins (B COMPLEX 100 PO) Take 1 tablet by mouth daily.    . irbesartan-hydrochlorothiazide (AVALIDE) 300-12.5 MG tablet Take 1 tablet by mouth at bedtime.    . Multiple Vitamin (MULTIVITAMIN) tablet Take 1 tablet by mouth daily.     . Omega-3 Fatty Acids (FISH OIL) 1000 MG CAPS Take 1,000 mg by mouth 2 (two) times daily before a meal.    . pravastatin (PRAVACHOL) 40 MG tablet Take 40 mg by mouth daily.    . propranolol (INDERAL) 10 MG tablet Take 10 mg by mouth daily as needed for tremors.    Marland Kitchen testosterone cypionate (DEPOTESTOSTERONE CYPIONATE) 200 MG/ML injection Inject 100 mg into the muscle every Saturday.   1    No results found for this or any previous visit (from the past 48 hour(s)). No results found.  Review of Systems  Constitutional: Positive for activity change.  HENT: Negative.   Eyes: Negative.   Respiratory: Negative.   Cardiovascular: Negative.   Gastrointestinal: Negative.   Endocrine: Negative.   Genitourinary: Negative.   Musculoskeletal: Positive for back pain and myalgias.  Allergic/Immunologic: Negative.   Neurological: Positive for weakness and numbness.  Hematological: Negative.   Psychiatric/Behavioral: Negative.     Blood pressure Marland Kitchen)  139/91, pulse 78, temperature 97.8 F (36.6 C), temperature source Oral, resp. rate 18, height 5\' 9"  (1.753 m), weight 83.5 kg, SpO2 98 %. Physical Exam  Constitutional: He is oriented to person, place, and time.  HENT:  Head: Normocephalic and atraumatic.  Nose: Nose normal.  Mouth/Throat: Mucous membranes are moist. Oropharynx is clear.  Eyes: Pupils are equal, round, and reactive to light. Conjunctivae are normal.  Cardiovascular: Normal rate, regular rhythm, normal heart sounds and normal pulses.  Respiratory: Effort normal and breath sounds normal.  GI: Soft. Normal appearance and bowel sounds are normal.  Musculoskeletal:     Cervical back: Normal range of motion and  neck supple.     Comments: Moderate weakness in the proximal lower extremities particularly in the quadriceps being graded 4-5.  Iliopsoas strength is decreased on the left 4 out of gastroc strength is intact  Neurological: He is alert and oriented to person, place, and time.  Skin: Skin is warm and dry.  Psychiatric: His behavior is normal. Mood normal.     Assessment/Plan Spondylosis stenosis neurogenic claudication with radiculopathy L2-3 and L3-4.  Plan: Decompression L2-3 and L3-4 with anterolateral technique and XLIF spacer placement with allograft arthrodesis.  Posterior segmental fixation L2-L4.  Earleen Newport, MD 01/11/2020, 7:38 AM

## 2020-01-11 NOTE — Transfer of Care (Signed)
Immediate Anesthesia Transfer of Care Note  Patient: Juan Hudson  Procedure(s) Performed: Lumbar Two-three Lumbar Three-Four Anterolateral decompression/fusion (N/A Spine Lumbar) Percutaneous pedicle screw fixation from Lumbar Two to Lumbar Four (N/A Spine Lumbar) APPLICATION OF ROBOTIC ASSISTANCE FOR SPINAL PROCEDURE (N/A Spine Lumbar)  Patient Location: PACU  Anesthesia Type:General  Level of Consciousness: awake  Airway & Oxygen Therapy: Patient Spontanous Breathing and Patient connected to face mask oxygen  Post-op Assessment: Report given to RN and Post -op Vital signs reviewed and stable  Post vital signs: Reviewed and stable  Last Vitals:  Vitals Value Taken Time  BP 129/95 01/11/20 1212  Temp    Pulse 113 01/11/20 1213  Resp 33 01/11/20 1213  SpO2 99 % 01/11/20 1213  Vitals shown include unvalidated device data.  Last Pain:  Vitals:   01/11/20 0627  TempSrc: Oral  PainSc: 0-No pain      Patients Stated Pain Goal: 3 (52/71/29 2909)  Complications: No complications documented.

## 2020-01-11 NOTE — Anesthesia Procedure Notes (Signed)
Procedure Name: Intubation Date/Time: 01/11/2020 7:49 AM Performed by: Lieutenant Diego, CRNA Pre-anesthesia Checklist: Patient identified, Emergency Drugs available, Suction available and Patient being monitored Patient Re-evaluated:Patient Re-evaluated prior to induction Oxygen Delivery Method: Circle system utilized Preoxygenation: Pre-oxygenation with 100% oxygen Induction Type: IV induction Ventilation: Mask ventilation without difficulty Laryngoscope Size: Miller and 2 Grade View: Grade I Tube type: Oral Tube size: 7.5 mm Number of attempts: 1 Airway Equipment and Method: Stylet Placement Confirmation: ETT inserted through vocal cords under direct vision,  positive ETCO2 and breath sounds checked- equal and bilateral Secured at: 23 cm Tube secured with: Tape Dental Injury: Teeth and Oropharynx as per pre-operative assessment

## 2020-01-11 NOTE — Progress Notes (Signed)
Patient ID: Juan Hudson, male   DOB: 09/19/1945, 74 y.o.   MRN: [id number] Vital signs are stable Motor function appears good in the lower extremities Patient has some soreness in the side and his back Incisions are clean and dry Stable postop

## 2020-01-12 ENCOUNTER — Encounter (HOSPITAL_COMMUNITY): Payer: Self-pay | Admitting: Neurological Surgery

## 2020-01-12 MED ORDER — OXYCODONE-ACETAMINOPHEN 5-325 MG PO TABS: 1.0000 | ORAL_TABLET | ORAL | 0 refills | Status: DC | PRN

## 2020-01-12 MED ORDER — DEXAMETHASONE 1 MG PO TABS
ORAL_TABLET | ORAL | 0 refills | Status: DC
Start: 2020-01-12 — End: 2020-03-30

## 2020-01-12 MED ORDER — METHOCARBAMOL 500 MG PO TABS: 500.0000 mg | ORAL_TABLET | Freq: Four times a day (QID) | ORAL | 3 refills | Status: DC | PRN

## 2020-01-12 NOTE — Discharge Instructions (Signed)
Wound Care Remove outer dressing in 3 days Leave incision open to air. You may shower. Do not scrub directly on incision.  Do not put any creams, lotions, or ointments on incision. Activity Walk each and every day, increasing distance each day. No lifting greater than 5 lbs.  Avoid bending, arching, and twisting. No driving for 2 weeks; may ride as a passenger locally. If provided with back brace, wear when out of bed.  It is not necessary to wear in bed. Diet Resume your normal diet.  Return to Work Will be discussed at you follow up appointment. Call Your Doctor If Any of These Occur Redness, drainage, or swelling at the wound.  Temperature greater than 101 degrees. Severe pain not relieved by pain medication. Incision starts to come apart. Follow Up Appt Call today for appointment in 2-3 weeks ([phone]) or for problems.  If you have any hardware placed in your spine, you will need an x-ray before your appointment.

## 2020-01-12 NOTE — Discharge Summary (Signed)
Physician Discharge Summary  Patient ID: Juan Hudson MRN: [id number] DOB/AGE: 02-24-1946 74 y.o.  Admit date: 01/11/2020 Discharge date: 01/12/2020  Admission Diagnoses: Lumbar stenosis L2-3 and L3-4 with neurogenic claudication and lumbar radiculopathy  Discharge Diagnoses: Lumbar stenosis L2-3 L3-4 with neurogenic claudication and lumbar radiculopathy Active Problems:   Lumbar stenosis with neurogenic claudication   Discharged Condition: good  Hospital Course: Patient was admitted to undergo surgery which he tolerated well  Consults: None  Significant Diagnostic Studies: None  Treatments: surgery: Anterolateral decompression L2-3 and L3-4 with lateral spacer posterior fixation L2-L4 with pedicle screws  Discharge Exam: Blood pressure 103/63, pulse 82, temperature 98.7 F (37.1 C), temperature source Oral, resp. rate 17, height 5\' 9"  (1.753 m), weight 83.5 kg, SpO2 97 %. Incision is clean and dry motor function is intact Station and gait are intact  Disposition: Discharge disposition: 01-Home or Self Care       Discharge Instructions    Call MD for:  redness, tenderness, or signs of infection (pain, swelling, redness, odor or green/yellow discharge around incision site)   Complete by: As directed    Call MD for:  severe uncontrolled pain   Complete by: As directed    Call MD for:  temperature >100.4   Complete by: As directed    Diet - low sodium heart healthy   Complete by: As directed    Discharge wound care:   Complete by: As directed    Okay to shower. Do not apply salves or appointments to incision. No heavy lifting with the upper extremities greater than 15 pounds. May resume driving when not requiring pain medication and patient feels comfortable with doing so.   Incentive spirometry RT   Complete by: As directed    Increase activity slowly   Complete by: As directed         Signed: Earleen Newport 01/12/2020, 9:17 AM

## 2020-01-12 NOTE — Plan of Care (Signed)
Patient alert and oriented, mae's well, voiding adequate amount of urine, swallowing without difficulty, no c/o pain at time of discharge. Patient discharged home with family. Script and discharged instructions given to patient. Patient and family stated understanding of instructions given. Patient has an appointment with Dr. Elsner  

## 2020-01-12 NOTE — Evaluation (Signed)
Occupational Therapy Evaluation Patient Details Name: Juan Hudson MRN: [id number] DOB: [birth year] Today's Date: 01/12/2020    History of Present Illness Pt is a 74 y/o male s/p L2-L4 XLIF. PMH including but not limited to HLD and HTN.   Clinical Impression   Prior to hospital admission, patient was living alone and was independent with all BADLs/IADLs. Patient enjoyed biking in his spare time. Patient currently presenting near baseline level of function demonstrating supervision A grossly for all self-care tasks and shower transfers. OT provided education on back precautions, acquisition and use of AE/DME to increase independence and safety with LB dressing while adhering to precautions, and activity pacing with patient able to  demonstatrate understanding with teach-back method. Patient does not currently require continued acute OT services. No follow-up OT recommendations at this time.     Follow Up Recommendations  No OT follow up;Supervision - Intermittent    Equipment Recommendations  3 in 1 bedside commode    Recommendations for Other Services       Precautions / Restrictions Precautions Precautions: Back Precaution Booklet Issued: Yes (comment) Precaution Comments: reviewed 3/3 back precautions and handout with pt throughout Required Braces or Orthoses: Spinal Brace Spinal Brace: Lumbar corset;Applied in sitting position Restrictions Weight Bearing Restrictions: No      Mobility Bed Mobility Overal bed mobility: Needs Assistance Bed Mobility: Rolling;Sidelying to Sit;Sit to Sidelying Rolling: Supervision Sidelying to sit: Supervision     Sit to sidelying: Supervision General bed mobility comments: Patient seated EOB upon entry  Transfers Overall transfer level: Needs assistance Equipment used: None Transfers: Sit to/from Stand Sit to Stand: Supervision         General transfer comment: Education on activity pacing.     Balance Overall balance  assessment: No apparent balance deficits (not formally assessed)                                         ADL either performed or assessed with clinical judgement   ADL Overall ADL's : Needs assistance/impaired     Grooming: Supervision/safety;Standing;Oral care;Wash/dry hands Grooming Details (indicate cue type and reason): Cues for adherence to back precautions Upper Body Bathing: Supervision/ safety;Sitting   Lower Body Bathing: Supervison/ safety;Sit to/from stand   Upper Body Dressing : Set up;Sitting Upper Body Dressing Details (indicate cue type and reason): To don UB clothing seated EOB Lower Body Dressing: Supervision/safety;Adhering to back precautions;Sit to/from stand Lower Body Dressing Details (indicate cue type and reason): Seated EOB with use of AE Toilet Transfer: Supervision/safety;Grab bars Toilet Transfer Details (indicate cue type and reason): On low commode in bathroom with use of grab bar         Functional mobility during ADLs: Supervision/safety General ADL Comments: Use of sock-aid and reacher for LB dressing after education.     Vision Baseline Vision/History: Wears glasses Wears Glasses: At all times Patient Visual Report: No change from baseline Vision Assessment?: No apparent visual deficits     Perception     Praxis      Pertinent Vitals/Pain Pain Assessment: 0-10 Pain Score: 1  Faces Pain Scale: Hurts little more Pain Location: 0/10 pain in back at rest and 1/10 pain with activity Pain Descriptors / Indicators: Guarding;Grimacing Pain Intervention(s): Monitored during session;Repositioned     Hand Dominance     Extremity/Trunk Assessment Upper Extremity Assessment Upper Extremity Assessment: Overall WFL for tasks assessed  Lower Extremity Assessment Lower Extremity Assessment: Overall WFL for tasks assessed   Cervical / Trunk Assessment Cervical / Trunk Assessment: Other exceptions Cervical / Trunk Exceptions:  s/p lumbar sx   Communication Communication Communication: No difficulties   Cognition Arousal/Alertness: Awake/alert Behavior During Therapy: WFL for tasks assessed/performed Overall Cognitive Status: Within Functional Limits for tasks assessed                                     General Comments       Exercises     Shoulder Instructions      Home Living Family/patient expects to be discharged to:: Private residence Living Arrangements: Alone Available Help at Discharge: Friend(s);Available PRN/intermittently Type of Home: House Home Access: Stairs to enter [employer] of Steps: 3 Entrance Stairs-Rails: Left Home Layout: One level     Bathroom Shower/Tub: Chief Strategy Officer: None          Prior Functioning/Environment Level of Independence: Independent                 OT Problem List: Decreased knowledge of use of DME or AE;Decreased knowledge of precautions      OT Treatment/Interventions:      OT Goals(Current goals can be found in the care plan section) Acute Rehab OT Goals Patient Stated Goal: To be able to ride his bike again.   OT Frequency:     Barriers to D/C:            Co-evaluation              AM-PAC OT "6 Clicks" Daily Activity     Outcome Measure Help from another person eating meals?: None Help from another person taking care of personal grooming?: A Little Help from another person toileting, which includes using toliet, bedpan, or urinal?: A Little Help from another person bathing (including washing, rinsing, drying)?: A Little Help from another person to put on and taking off regular upper body clothing?: None Help from another person to put on and taking off regular lower body clothing?: A Little 6 Click Score: 20   End of Session    Activity Tolerance:   Patient left: in chair;with call bell/phone within reach  OT Visit Diagnosis: Muscle weakness (generalized)  (M62.81);Pain                Time: 3299-2426 OT Time Calculation (min): 31 min Charges:  OT General Charges $OT Visit: 1 Visit OT Evaluation $OT Eval Low Complexity: 1 Low OT Treatments $Self Care/Home Management : 8-22 mins   H. OTR/L Supplemental OT, Department of rehab services [phone]   R H. 01/12/2020, 9:49 AM

## 2020-01-12 NOTE — Evaluation (Signed)
Physical Therapy Evaluation Patient Details Name: Juan Hudson MRN: [id number] DOB: 12-29-1945 Today's Date: 01/12/2020   History of Present Illness  Pt is a 74 y/o male s/p L2-L4 XLIF. PMH including but not limited to HLD and HTN.  Clinical Impression  Pt presented supine in bed with HOB elevated, awake and willing to participate in therapy session. Prior to admission, pt reported that he was independent with all functional mobility and ADLs. At the time of evaluation, pt was moving very well without the need for an AD or any physical assistance. He tolerated hallway ambulation without difficulties and participated in stair training as well. PT provided pt education re: back precautions with handout provided, donning/doffing LSO and wear schedule, and a generalized walking program for pt to initiate upon d/c home. No further acute PT needs identified at this time. PT signing off.     Follow Up Recommendations No PT follow up    Equipment Recommendations  None recommended by PT    Recommendations for Other Services       Precautions / Restrictions Precautions Precautions: Back Precaution Booklet Issued: Yes (comment) Precaution Comments: reviewed 3/3 back precautions and handout with pt throughout Required Braces or Orthoses: Spinal Brace Spinal Brace: Lumbar corset;Applied in sitting position Restrictions Weight Bearing Restrictions: No      Mobility  Bed Mobility Overal bed mobility: Needs Assistance Bed Mobility: Rolling;Sidelying to Sit;Sit to Sidelying Rolling: Supervision Sidelying to sit: Supervision     Sit to sidelying: Supervision General bed mobility comments: cueing for log roll technique  Transfers Overall transfer level: Needs assistance Equipment used: None Transfers: Sit to/from Stand Sit to Stand: Supervision         General transfer comment: steady with transition  Ambulation/Gait Ambulation/Gait assistance: Supervision Gait Distance (Feet):  500 Feet Assistive device: None Gait Pattern/deviations: Decreased stride length Gait velocity: WFL   General Gait Details: pt with mild instability but no overt LOB or need for physical assistance, supervision for safety without use of an AD  Stairs Stairs: Yes Stairs assistance: Supervision Stair Management: One rail Left;Alternating pattern;Forwards Number of Stairs: 3 General stair comments: no instability or LOB  Wheelchair Mobility    Modified Rankin (Stroke Patients Only)       Balance Overall balance assessment: No apparent balance deficits (not formally assessed)                                           Pertinent Vitals/Pain Pain Assessment: Faces Faces Pain Scale: Hurts little more Pain Location: back (with stairs) Pain Descriptors / Indicators: Guarding;Grimacing Pain Intervention(s): Monitored during session;Repositioned    Home Living Family/patient expects to be discharged to:: Private residence Living Arrangements: Alone Available Help at Discharge: Friend(s);Available PRN/intermittently Type of Home: House Home Access: Stairs to enter Entrance Stairs-Rails: Left Entrance Stairs-Number of Steps: 3 Home Layout: One level Home Equipment: None      Prior Function Level of Independence: Independent               Hand Dominance        Extremity/Trunk Assessment   Upper Extremity Assessment Upper Extremity Assessment: Defer to OT evaluation;Overall WFL for tasks assessed    Lower Extremity Assessment Lower Extremity Assessment: Overall WFL for tasks assessed    Cervical / Trunk Assessment Cervical / Trunk Assessment: Other exceptions Cervical / Trunk Exceptions: s/p lumbar sx  Communication  Communication: No difficulties  Cognition Arousal/Alertness: Awake/alert Behavior During Therapy: WFL for tasks assessed/performed Overall Cognitive Status: Within Functional Limits for tasks assessed                                         General Comments      Exercises     Assessment/Plan    PT Assessment Patent does not need any further PT services  PT Problem List         PT Treatment Interventions      PT Goals (Current goals can be found in the Care Plan section)  Acute Rehab PT Goals Patient Stated Goal: to stay another night and then return home PT Goal Formulation: All assessment and education complete, DC therapy    Frequency     Barriers to discharge        Co-evaluation               AM-PAC PT "6 Clicks" Mobility  Outcome Measure Help needed turning from your back to your side while in a flat bed without using bedrails?: None Help needed moving from lying on your back to sitting on the side of a flat bed without using bedrails?: None Help needed moving to and from a bed to a chair (including a wheelchair)?: None Help needed standing up from a chair using your arms (e.g., wheelchair or bedside chair)?: None Help needed to walk in hospital room?: None Help needed climbing 3-5 steps with a railing? : None 6 Click Score: 24    End of Session Equipment Utilized During Treatment: Back brace Activity Tolerance: Patient tolerated treatment well Patient left: in bed;with call bell/phone within reach Nurse Communication: Mobility status PT Visit Diagnosis: Other abnormalities of gait and mobility (R26.89)    Time: 5789-7847 PT Time Calculation (min) (ACUTE ONLY): 16 min   Charges:   PT Evaluation $PT Eval Low Complexity: 1 Low          Eduard Clos, PT, DPT  Acute Rehabilitation Services Pager [phone] Office [place] 01/12/2020, 8:46 AM

## 2020-01-12 NOTE — Anesthesia Postprocedure Evaluation (Signed)
Anesthesia Post Note  Patient: Juan Hudson  Procedure(s) Performed: Lumbar Two-three Lumbar Three-Four Anterolateral decompression/fusion (N/A Spine Lumbar) Percutaneous pedicle screw fixation from Lumbar Two to Lumbar Four (N/A Spine Lumbar) APPLICATION OF ROBOTIC ASSISTANCE FOR SPINAL PROCEDURE (N/A Spine Lumbar)     Patient location during evaluation: PACU Anesthesia Type: General Level of consciousness: awake and alert Pain management: pain level controlled Vital Signs Assessment: post-procedure vital signs reviewed and stable Respiratory status: spontaneous breathing, nonlabored ventilation, respiratory function stable and patient connected to nasal cannula oxygen Cardiovascular status: blood pressure returned to baseline and stable Postop Assessment: no apparent nausea or vomiting Anesthetic complications: no   No complications documented.  Last Vitals:  Vitals:   01/12/20 0308 01/12/20 0716  BP: (!) 160/94 103/63  Pulse: 78 82  Resp: 18 17  Temp: 36.7 C 37.1 C  SpO2: 99% 97%    Last Pain:  Vitals:   01/12/20 0756  TempSrc:   PainSc: 3                   

## 2020-01-26 DIAGNOSIS — N3000 Acute cystitis without hematuria: Secondary | ICD-10-CM | POA: Diagnosis not present

## 2020-01-26 DIAGNOSIS — R311 Benign essential microscopic hematuria: Secondary | ICD-10-CM | POA: Diagnosis not present

## 2020-01-26 DIAGNOSIS — R3915 Urgency of urination: Secondary | ICD-10-CM | POA: Diagnosis not present

## 2020-01-28 DIAGNOSIS — M4316 Spondylolisthesis, lumbar region: Secondary | ICD-10-CM | POA: Diagnosis not present

## 2020-02-02 DIAGNOSIS — R351 Nocturia: Secondary | ICD-10-CM | POA: Diagnosis not present

## 2020-02-02 DIAGNOSIS — R311 Benign essential microscopic hematuria: Secondary | ICD-10-CM | POA: Diagnosis not present

## 2020-02-02 DIAGNOSIS — R3915 Urgency of urination: Secondary | ICD-10-CM | POA: Diagnosis not present

## 2020-02-07 DIAGNOSIS — M5416 Radiculopathy, lumbar region: Secondary | ICD-10-CM | POA: Diagnosis not present

## 2020-02-07 DIAGNOSIS — R531 Weakness: Secondary | ICD-10-CM | POA: Diagnosis not present

## 2020-02-07 DIAGNOSIS — M545 Low back pain: Secondary | ICD-10-CM | POA: Diagnosis not present

## 2020-02-07 DIAGNOSIS — R29898 Other symptoms and signs involving the musculoskeletal system: Secondary | ICD-10-CM | POA: Diagnosis not present

## 2020-02-11 DIAGNOSIS — M545 Low back pain: Secondary | ICD-10-CM | POA: Diagnosis not present

## 2020-02-11 DIAGNOSIS — R531 Weakness: Secondary | ICD-10-CM | POA: Diagnosis not present

## 2020-02-11 DIAGNOSIS — R29898 Other symptoms and signs involving the musculoskeletal system: Secondary | ICD-10-CM | POA: Diagnosis not present

## 2020-02-11 DIAGNOSIS — M5416 Radiculopathy, lumbar region: Secondary | ICD-10-CM | POA: Diagnosis not present

## 2020-02-15 DIAGNOSIS — R29898 Other symptoms and signs involving the musculoskeletal system: Secondary | ICD-10-CM | POA: Diagnosis not present

## 2020-02-15 DIAGNOSIS — M545 Low back pain: Secondary | ICD-10-CM | POA: Diagnosis not present

## 2020-02-15 DIAGNOSIS — R531 Weakness: Secondary | ICD-10-CM | POA: Diagnosis not present

## 2020-02-15 DIAGNOSIS — M5416 Radiculopathy, lumbar region: Secondary | ICD-10-CM | POA: Diagnosis not present

## 2020-02-17 DIAGNOSIS — M5416 Radiculopathy, lumbar region: Secondary | ICD-10-CM | POA: Diagnosis not present

## 2020-02-17 DIAGNOSIS — M545 Low back pain: Secondary | ICD-10-CM | POA: Diagnosis not present

## 2020-02-17 DIAGNOSIS — R531 Weakness: Secondary | ICD-10-CM | POA: Diagnosis not present

## 2020-02-17 DIAGNOSIS — R29898 Other symptoms and signs involving the musculoskeletal system: Secondary | ICD-10-CM | POA: Diagnosis not present

## 2020-02-21 DIAGNOSIS — L578 Other skin changes due to chronic exposure to nonionizing radiation: Secondary | ICD-10-CM | POA: Diagnosis not present

## 2020-02-21 DIAGNOSIS — L821 Other seborrheic keratosis: Secondary | ICD-10-CM | POA: Diagnosis not present

## 2020-02-21 DIAGNOSIS — D0461 Carcinoma in situ of skin of right upper limb, including shoulder: Secondary | ICD-10-CM | POA: Diagnosis not present

## 2020-02-21 DIAGNOSIS — Z85828 Personal history of other malignant neoplasm of skin: Secondary | ICD-10-CM | POA: Diagnosis not present

## 2020-02-21 DIAGNOSIS — L570 Actinic keratosis: Secondary | ICD-10-CM | POA: Diagnosis not present

## 2020-02-21 DIAGNOSIS — D225 Melanocytic nevi of trunk: Secondary | ICD-10-CM | POA: Diagnosis not present

## 2020-02-21 DIAGNOSIS — D485 Neoplasm of uncertain behavior of skin: Secondary | ICD-10-CM | POA: Diagnosis not present

## 2020-02-23 ENCOUNTER — Ambulatory Visit: Payer: PPO | Admitting: Internal Medicine

## 2020-02-23 DIAGNOSIS — M545 Low back pain: Secondary | ICD-10-CM | POA: Diagnosis not present

## 2020-02-23 DIAGNOSIS — M5416 Radiculopathy, lumbar region: Secondary | ICD-10-CM | POA: Diagnosis not present

## 2020-02-23 DIAGNOSIS — R29898 Other symptoms and signs involving the musculoskeletal system: Secondary | ICD-10-CM | POA: Diagnosis not present

## 2020-02-23 DIAGNOSIS — R531 Weakness: Secondary | ICD-10-CM | POA: Diagnosis not present

## 2020-02-24 DIAGNOSIS — R29898 Other symptoms and signs involving the musculoskeletal system: Secondary | ICD-10-CM | POA: Diagnosis not present

## 2020-02-24 DIAGNOSIS — M5416 Radiculopathy, lumbar region: Secondary | ICD-10-CM | POA: Diagnosis not present

## 2020-02-24 DIAGNOSIS — M545 Low back pain: Secondary | ICD-10-CM | POA: Diagnosis not present

## 2020-02-24 DIAGNOSIS — R531 Weakness: Secondary | ICD-10-CM | POA: Diagnosis not present

## 2020-02-29 DIAGNOSIS — R29898 Other symptoms and signs involving the musculoskeletal system: Secondary | ICD-10-CM | POA: Diagnosis not present

## 2020-02-29 DIAGNOSIS — R531 Weakness: Secondary | ICD-10-CM | POA: Diagnosis not present

## 2020-02-29 DIAGNOSIS — M545 Low back pain: Secondary | ICD-10-CM | POA: Diagnosis not present

## 2020-02-29 DIAGNOSIS — M5416 Radiculopathy, lumbar region: Secondary | ICD-10-CM | POA: Diagnosis not present

## 2020-03-01 DIAGNOSIS — R29898 Other symptoms and signs involving the musculoskeletal system: Secondary | ICD-10-CM | POA: Diagnosis not present

## 2020-03-01 DIAGNOSIS — M4316 Spondylolisthesis, lumbar region: Secondary | ICD-10-CM | POA: Diagnosis not present

## 2020-03-02 DIAGNOSIS — M545 Low back pain: Secondary | ICD-10-CM | POA: Diagnosis not present

## 2020-03-02 DIAGNOSIS — M5416 Radiculopathy, lumbar region: Secondary | ICD-10-CM | POA: Diagnosis not present

## 2020-03-02 DIAGNOSIS — R29898 Other symptoms and signs involving the musculoskeletal system: Secondary | ICD-10-CM | POA: Diagnosis not present

## 2020-03-02 DIAGNOSIS — R531 Weakness: Secondary | ICD-10-CM | POA: Diagnosis not present

## 2020-03-06 DIAGNOSIS — M545 Low back pain: Secondary | ICD-10-CM | POA: Diagnosis not present

## 2020-03-06 DIAGNOSIS — R29898 Other symptoms and signs involving the musculoskeletal system: Secondary | ICD-10-CM | POA: Diagnosis not present

## 2020-03-06 DIAGNOSIS — R531 Weakness: Secondary | ICD-10-CM | POA: Diagnosis not present

## 2020-03-06 DIAGNOSIS — M5416 Radiculopathy, lumbar region: Secondary | ICD-10-CM | POA: Diagnosis not present

## 2020-03-08 DIAGNOSIS — M5416 Radiculopathy, lumbar region: Secondary | ICD-10-CM | POA: Diagnosis not present

## 2020-03-08 DIAGNOSIS — M545 Low back pain: Secondary | ICD-10-CM | POA: Diagnosis not present

## 2020-03-08 DIAGNOSIS — R29898 Other symptoms and signs involving the musculoskeletal system: Secondary | ICD-10-CM | POA: Diagnosis not present

## 2020-03-08 DIAGNOSIS — R531 Weakness: Secondary | ICD-10-CM | POA: Diagnosis not present

## 2020-03-20 DIAGNOSIS — R29898 Other symptoms and signs involving the musculoskeletal system: Secondary | ICD-10-CM | POA: Diagnosis not present

## 2020-03-20 DIAGNOSIS — M5416 Radiculopathy, lumbar region: Secondary | ICD-10-CM | POA: Diagnosis not present

## 2020-03-20 DIAGNOSIS — M545 Low back pain: Secondary | ICD-10-CM | POA: Diagnosis not present

## 2020-03-20 DIAGNOSIS — R531 Weakness: Secondary | ICD-10-CM | POA: Diagnosis not present

## 2020-03-30 ENCOUNTER — Other Ambulatory Visit: Payer: Self-pay

## 2020-03-30 ENCOUNTER — Ambulatory Visit: Payer: PPO | Admitting: Internal Medicine

## 2020-03-30 ENCOUNTER — Encounter: Payer: Self-pay | Admitting: Internal Medicine

## 2020-03-30 VITALS — BP 118/74 | HR 74 | Temp 97.8°F | Ht 70.0 in | Wt 182.2 lb

## 2020-03-30 DIAGNOSIS — R011 Cardiac murmur, unspecified: Secondary | ICD-10-CM

## 2020-03-30 DIAGNOSIS — G4733 Obstructive sleep apnea (adult) (pediatric): Secondary | ICD-10-CM | POA: Diagnosis not present

## 2020-03-30 NOTE — Patient Instructions (Signed)
Order- DME [employer]- please replace CPAP headgear.   Continue auto 5-15, mask of choice, humidifier, supplies, AirView/ card  Please call if we can help

## 2020-03-30 NOTE — Progress Notes (Signed)
Subjective:    Patient ID: Juan Hudson, male    DOB: 08/24/1945, 74 y.o.   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  He had UPPP surgery and septoplasty by Dr Wilburn Cornelia, but unsuccessfull.  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 Nasal mask. Machine is 3-4 yrs old.  Now working toward a Wellsite geologist. Has noted he clears chest of some clear phlegm once or twice daily, but denies significant cough, wheeze or dyspnea. Agreed to CXR just to check.  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  He dropped goal of learning to fly.  ROS-see HPI   + = positive Constitutional:    weight loss, night sweats, fevers, chills, fatigue, lassitude. HEENT:    headaches, difficulty swallowing, tooth/dental problems, sore throat,       sneezing, itching, ear ache, nasal congestion, post nasal drip, snoring CV:    chest pain, orthopnea, PND, swelling in lower extremities, anasarca,                          dizziness, palpitations Resp:   shortness of breath with exertion or at rest.                +productive cough,   non-productive cough, coughing up of blood.               change in color of mucus.  wheezing.   Skin:    rash or lesions. GI:  No-   heartburn, indigestion, abdominal pain, nausea, vomiting, diarrhea,                 change in bowel habits, loss of appetite GU: dysuria, change in color of urine, no urgency or frequency.   flank pain. MS:   joint pain, stiffness, decreased range of motion, back pain. Neuro-     nothing unusual Psych:  change in mood or affect.  depression or anxiety.   memory loss.    Objective:   Physical Exam General- Alert, Oriented, Affect-appropriate, Distress- none acute , healthy appearing Skin- no rash Lymphadenopathy- none Head- atraumatic            Eyes- Gross vision intact, PERRLA, conjunctivae clear secretions            Ears- Hearing, canals normal            Nose- Clear, No-Septal dev, mucus, polyps, erosion, perforation             Throat- S/p UPPP , mucosa clear , drainage- none, tonsils- atrophic Neck- flexible , trachea midline, no stridor , thyroid nl, carotid no bruit Chest - symmetrical excursion , unlabored           Heart/CV- RRR ,  VOHYWV+3/7 systolic/ aortic ,  no gallop  , no rub, nl s1 s2                           - JVD- none , edema- none, stasis changes- none, varices- none           Lung- clear to P&A, wheeze- none, cough- none , dullness-none, rub- none           Chest wall-  Abd- tender-no, distended-no, bowel sounds-present, HSM- no Br/ Gen/ Rectal- Not done, not indicated Extrem- cyanosis- none, clubbing, none, atrophy- none, strength- nl Neuro- grossly intact to observation    Assessment & Plan:

## 2020-04-04 DIAGNOSIS — R531 Weakness: Secondary | ICD-10-CM | POA: Diagnosis not present

## 2020-04-04 DIAGNOSIS — M545 Low back pain: Secondary | ICD-10-CM | POA: Diagnosis not present

## 2020-04-04 DIAGNOSIS — R29898 Other symptoms and signs involving the musculoskeletal system: Secondary | ICD-10-CM | POA: Diagnosis not present

## 2020-04-04 DIAGNOSIS — M5416 Radiculopathy, lumbar region: Secondary | ICD-10-CM | POA: Diagnosis not present

## 2020-04-07 DIAGNOSIS — R29898 Other symptoms and signs involving the musculoskeletal system: Secondary | ICD-10-CM | POA: Diagnosis not present

## 2020-04-07 DIAGNOSIS — M5416 Radiculopathy, lumbar region: Secondary | ICD-10-CM | POA: Diagnosis not present

## 2020-04-07 DIAGNOSIS — R531 Weakness: Secondary | ICD-10-CM | POA: Diagnosis not present

## 2020-04-07 DIAGNOSIS — M545 Low back pain: Secondary | ICD-10-CM | POA: Diagnosis not present

## 2020-04-10 DIAGNOSIS — M5416 Radiculopathy, lumbar region: Secondary | ICD-10-CM | POA: Diagnosis not present

## 2020-04-10 DIAGNOSIS — M545 Low back pain: Secondary | ICD-10-CM | POA: Diagnosis not present

## 2020-04-10 DIAGNOSIS — R531 Weakness: Secondary | ICD-10-CM | POA: Diagnosis not present

## 2020-04-10 DIAGNOSIS — R29898 Other symptoms and signs involving the musculoskeletal system: Secondary | ICD-10-CM | POA: Diagnosis not present

## 2020-04-11 NOTE — Assessment & Plan Note (Signed)
Mild AI and AS on Echo in 2020, being followed

## 2020-04-11 NOTE — Assessment & Plan Note (Signed)
Benefits from CPAP with good compliance and control. Back to regular use after back surgery. Plan- continue auto 5-15

## 2020-04-13 DIAGNOSIS — R531 Weakness: Secondary | ICD-10-CM | POA: Diagnosis not present

## 2020-04-13 DIAGNOSIS — R29898 Other symptoms and signs involving the musculoskeletal system: Secondary | ICD-10-CM | POA: Diagnosis not present

## 2020-04-13 DIAGNOSIS — E785 Hyperlipidemia, unspecified: Secondary | ICD-10-CM | POA: Diagnosis not present

## 2020-04-13 DIAGNOSIS — M545 Low back pain: Secondary | ICD-10-CM | POA: Diagnosis not present

## 2020-04-13 DIAGNOSIS — M5416 Radiculopathy, lumbar region: Secondary | ICD-10-CM | POA: Diagnosis not present

## 2020-04-13 DIAGNOSIS — M7020 Olecranon bursitis, unspecified elbow: Secondary | ICD-10-CM | POA: Diagnosis not present

## 2020-04-17 DIAGNOSIS — R531 Weakness: Secondary | ICD-10-CM | POA: Diagnosis not present

## 2020-04-17 DIAGNOSIS — M5416 Radiculopathy, lumbar region: Secondary | ICD-10-CM | POA: Diagnosis not present

## 2020-04-17 DIAGNOSIS — R29898 Other symptoms and signs involving the musculoskeletal system: Secondary | ICD-10-CM | POA: Diagnosis not present

## 2020-04-17 DIAGNOSIS — M545 Low back pain: Secondary | ICD-10-CM | POA: Diagnosis not present

## 2020-04-20 DIAGNOSIS — R29898 Other symptoms and signs involving the musculoskeletal system: Secondary | ICD-10-CM | POA: Diagnosis not present

## 2020-04-20 DIAGNOSIS — M545 Low back pain: Secondary | ICD-10-CM | POA: Diagnosis not present

## 2020-04-20 DIAGNOSIS — R531 Weakness: Secondary | ICD-10-CM | POA: Diagnosis not present

## 2020-04-20 DIAGNOSIS — M5416 Radiculopathy, lumbar region: Secondary | ICD-10-CM | POA: Diagnosis not present

## 2020-04-24 DIAGNOSIS — M4326 Fusion of spine, lumbar region: Secondary | ICD-10-CM | POA: Diagnosis not present

## 2020-04-24 DIAGNOSIS — R531 Weakness: Secondary | ICD-10-CM | POA: Diagnosis not present

## 2020-04-24 DIAGNOSIS — R29898 Other symptoms and signs involving the musculoskeletal system: Secondary | ICD-10-CM | POA: Diagnosis not present

## 2020-04-24 DIAGNOSIS — M5416 Radiculopathy, lumbar region: Secondary | ICD-10-CM | POA: Diagnosis not present

## 2020-05-02 DIAGNOSIS — D0461 Carcinoma in situ of skin of right upper limb, including shoulder: Secondary | ICD-10-CM | POA: Diagnosis not present

## 2020-05-03 DIAGNOSIS — M47816 Spondylosis without myelopathy or radiculopathy, lumbar region: Secondary | ICD-10-CM | POA: Diagnosis not present

## 2020-05-03 DIAGNOSIS — H1132 Conjunctival hemorrhage, left eye: Secondary | ICD-10-CM | POA: Diagnosis not present

## 2020-05-03 DIAGNOSIS — R29898 Other symptoms and signs involving the musculoskeletal system: Secondary | ICD-10-CM | POA: Diagnosis not present

## 2020-05-04 ENCOUNTER — Ambulatory Visit: Payer: PPO | Admitting: Neurology

## 2020-05-04 ENCOUNTER — Encounter: Payer: Self-pay | Admitting: Neurology

## 2020-05-04 ENCOUNTER — Other Ambulatory Visit: Payer: Self-pay

## 2020-05-04 VITALS — BP 136/85 | HR 80 | Ht 70.0 in | Wt 181.5 lb

## 2020-05-04 DIAGNOSIS — M48062 Spinal stenosis, lumbar region with neurogenic claudication: Secondary | ICD-10-CM

## 2020-05-04 NOTE — Progress Notes (Signed)
Reason for visit: Leg weakness  Referring physician: Dr. Adelina Mings is a 74 y.o. male  History of present illness:  [name] is a 74 year old left-handed white male with a history of prior lumbosacral spine surgery done through Dr. Ellene Route.  The patient was recovering well from the surgery, he was out in [place] hiking, he could hike up to 5 miles in a day, with some minimal discomfort.  The patient however began noting significant issues with walking that began about 2 or 3 weeks ago.  The patient has reported onset of bilateral hip discomfort and some pain into the right anterior thigh that is present when he tries to stand or bear weight.  When he sits down or lies down the pain immediately disappears.  The patient needs to take a few short steps in order to start walking more normally, over the last day he has started to use a walking stick to help some.  The patient has the sensation of weakness when he flexes the hips on either side.  The patient denies any numbness in the legs or numbness in the feet.  He denies neck pain or pain down the arms on either side.  He denies issues controlling the bowels or the bladder but he does have some urinary frequency at night.  He does have a left hand tremor that he has had for 15 years without significant progression.  He notes the tremor mainly when holding a plate or tray, not at other times.  It appears that the patient has had recent x-rays documenting severe degeneration of the hip joints bilaterally, he will be seen through orthopedic surgery in the near future for this.  He comes to this office for an evaluation.  Past Medical History:  Diagnosis Date  . Allergy   . Cancer ([place])    skin cancer  . Cataract    early  . GERD (gastroesophageal reflux disease)   . HLD (hyperlipidemia)   . Hypertension   . Rosacea   . Sleep apnea    cpap  . Systolic murmur     Past Surgical History:  Procedure Laterality Date  . ABDOMINAL  HERNIA REPAIR     x2  . ANTERIOR LAT LUMBAR FUSION N/A 01/11/2020   Procedure: Lumbar Two-three Lumbar Three-Four Anterolateral decompression/fusion;  Surgeon: Kristeen Miss, MD;  Location: [facility];  Service: Neurosurgery;  Laterality: N/A;  anterolateral  . APPLICATION OF ROBOTIC ASSISTANCE FOR SPINAL PROCEDURE N/A 01/11/2020   Procedure: APPLICATION OF ROBOTIC ASSISTANCE FOR SPINAL PROCEDURE;  Surgeon: Kristeen Miss, MD;  Location: [facility];  Service: Neurosurgery;  Laterality: N/A;  posterior  . COLONOSCOPY  04-01-2003   tics and hems  . HEMORRHOID SURGERY    . INGUINAL HERNIA REPAIR    . LUMBAR PERCUTANEOUS PEDICLE SCREW 2 LEVEL N/A 01/11/2020   Procedure: Percutaneous pedicle screw fixation from Lumbar Two to Lumbar Four;  Surgeon: Kristeen Miss, MD;  Location: [facility];  Service: Neurosurgery;  Laterality: N/A;  posterior  . NOSE SURGERY    . TONSILLECTOMY    . uvuloplasty      Family History  Problem Relation Age of Onset  . Heart attack Father        pacemaker  . Dementia Mother   . Colon cancer Neg Hx   . Rectal cancer Neg Hx   . Stomach cancer Neg Hx     Social history:  reports that he quit smoking about 55 years ago.  His smoking use included cigarettes. He has never used smokeless tobacco. He reports current alcohol use. He reports that he does not use drugs.  Medications:  Prior to Admission medications   Medication Sig Start Date End Date Taking? Authorizing Provider  B Complex Vitamins (B COMPLEX 100 PO) Take 1 tablet by mouth daily.   Yes [provider]  celecoxib (CELEBREX) 200 MG capsule Take by mouth. 01/28/20  Yes [provider]  irbesartan-hydrochlorothiazide (AVALIDE) 300-12.5 MG tablet Take 1 tablet by mouth at bedtime.   Yes [provider]  Multiple Vitamin (MULTIVITAMIN) tablet Take 1 tablet by mouth daily.    Yes [provider]  Multiple Vitamins-Minerals (MULTIVITAMIN WITH IRON-MINERALS) liquid See admin instructions.   Yes  [provider]  Omega-3 Fatty Acids (FISH OIL) 1000 MG CAPS Take 1,000 mg by mouth 2 (two) times daily before a meal.   Yes [provider]  pravastatin (PRAVACHOL) 40 MG tablet Take 20 mg by mouth daily.    Yes [provider]  testosterone cypionate (DEPOTESTOSTERONE CYPIONATE) 200 MG/ML injection Inject 100 mg into the muscle every Saturday.  08/28/15  Yes [provider]  aspirin 81 MG tablet Take 81 mg by mouth at bedtime.     [provider]      Allergies  Allergen Reactions  . Codeine Nausea Only    ROS:  Out of a complete 14 system review of symptoms, the patient complains only of the following symptoms, and all other reviewed systems are negative.  Walking difficulty Hip pain, back pain  Blood pressure 136/85, pulse 80, height 5\' 10"  (1.778 m), weight 181 lb 8 oz (82.3 kg).  Physical Exam  General: The patient is alert and cooperative at the time of the examination.  Eyes: Pupils are equal, round, and reactive to light. Discs are flat bilaterally.  The patient has a scleral hemorrhage on the left.  Neck: The neck is supple, no carotid bruits are noted.  Respiratory: The respiratory examination is clear.  Cardiovascular: The cardiovascular examination reveals a regular rate and rhythm, a grade III/VI systolic ejection murmur was noted at the aortic area.  Neuromuscular: The patient does have some discomfort with internal rotation of the right hip, not present on the left.  Skin: Extremities are without significant edema.  Neurologic Exam  Mental status: The patient is alert and oriented x 3 at the time of the examination. The patient has apparent normal recent and remote memory, with an apparently normal attention span and concentration ability.  Cranial nerves: Facial symmetry is present. There is good sensation of the face to pinprick and soft touch bilaterally. The strength of the facial muscles and the muscles to head  turning and shoulder shrug are normal bilaterally. Speech is well enunciated, no aphasia or dysarthria is noted. Extraocular movements are full. Visual fields are full. The tongue is midline, and the patient has symmetric elevation of the soft palate. No obvious hearing deficits are noted.  Motor: The motor testing reveals 5 over 5 strength of all 4 extremities, with exception of 4/5 strength with hip flexion bilaterally. Good symmetric motor tone is noted throughout.  Sensory: Sensory testing is intact to pinprick, soft touch, vibration sensation, and position sense on all 4 extremities. No evidence of extinction is noted.  Coordination: Cerebellar testing reveals good finger-nose-finger and heel-to-shin bilaterally.  Gait and station: Gait is slightly wide-based, the patient is walking with the knees partially flexed.  He has a slow deliberate gait.  Tandem gait was not attempted.  Romberg is negative.  Reflexes: Deep tendon reflexes are symmetric and normal bilaterally in the arms, but depressed in the legs. Toes are downgoing bilaterally.   Assessment/Plan:  1.  New onset gait disturbance, bilateral hip pain  The history and clinical examination does appear to be consistent with significant bilateral hip degenerative arthritis.  I believe that the patient will make a good recovery with his ability to walk once the hip issue is addressed.  If the patient does not believe that he is doing well following treatment of the degenerative hip arthritis, he is to contact our office and we will pursue further evaluation.   Jill Alexanders MD 05/04/2020 8:33 AM  [facility] [address] [place], [place] [address]  Phone [phone] Fax [phone]

## 2020-05-10 DIAGNOSIS — R531 Weakness: Secondary | ICD-10-CM | POA: Diagnosis not present

## 2020-05-10 DIAGNOSIS — M4326 Fusion of spine, lumbar region: Secondary | ICD-10-CM | POA: Diagnosis not present

## 2020-05-10 DIAGNOSIS — R351 Nocturia: Secondary | ICD-10-CM | POA: Diagnosis not present

## 2020-05-10 DIAGNOSIS — R3915 Urgency of urination: Secondary | ICD-10-CM | POA: Diagnosis not present

## 2020-05-10 DIAGNOSIS — R29898 Other symptoms and signs involving the musculoskeletal system: Secondary | ICD-10-CM | POA: Diagnosis not present

## 2020-05-10 DIAGNOSIS — N3281 Overactive bladder: Secondary | ICD-10-CM | POA: Diagnosis not present

## 2020-05-10 DIAGNOSIS — M5416 Radiculopathy, lumbar region: Secondary | ICD-10-CM | POA: Diagnosis not present

## 2020-05-15 ENCOUNTER — Ambulatory Visit: Payer: Self-pay

## 2020-05-15 ENCOUNTER — Ambulatory Visit: Payer: PPO | Admitting: Orthopaedic Surgery

## 2020-05-15 DIAGNOSIS — M1612 Unilateral primary osteoarthritis, left hip: Secondary | ICD-10-CM

## 2020-05-15 DIAGNOSIS — M25552 Pain in left hip: Secondary | ICD-10-CM

## 2020-05-15 DIAGNOSIS — M1611 Unilateral primary osteoarthritis, right hip: Secondary | ICD-10-CM | POA: Diagnosis not present

## 2020-05-15 DIAGNOSIS — M25551 Pain in right hip: Secondary | ICD-10-CM | POA: Diagnosis not present

## 2020-05-15 NOTE — Progress Notes (Signed)
Office Visit Note   Patient: Juan Hudson           Date of Birth: 08-Sep-1945           MRN: [id number] Visit Date: 05/15/2020              Requested by: Crist Infante, MD [address] [place],  [place] [address] PCP: Crist Infante, MD   Assessment & Plan: Visit Diagnoses:  1. Pain in left hip   2. Pain in right hip   3. Unilateral primary osteoarthritis, left hip   4. Unilateral primary osteoarthritis, right hip     Plan: The patient has severe end-stage arthritis of both his hips.  I spent some time showing him a hip model and go over his x-rays.  We explained in detail with hip replacement surgery involves and we have recommended this to him.  I gave him a handout about hip replacement surgery as well.  We talked about the interoperative and postoperative course as well as the risk and benefits of surgery.  All questions and concerns were answered and addressed.  We will work on getting this scheduled for right total hip arthroplasty in the near future.  Follow-Up Instructions: Return for 2 weeks post-op.   Orders:  No orders of the defined types were placed in this encounter.  No orders of the defined types were placed in this encounter.     Procedures: No procedures performed   Clinical Data: No additional findings.   Subjective: Chief Complaint  Patient presents with  . Right Hip - Pain  . Left Hip - Pain  The patient is a very pleasant 74 year old gentleman with debilitating hip pain involving both his hips with the right worse than left.  This is come on all of a sudden over the last 12 months.  He has x-rays on the canopy system.  They show complete bone-on-bone wear of both hips.  He is someone who has had extensive lumbar spine surgery in June of this year by Dr. Ellene Route.  The spine surgery is done very well for him.  He does have pain in the groin.  It has been getting significantly worse over the past few months.  His bilateral hip pain can be 10/10.  At  this point is definitely affecting his mobility, his quality of life and his activities day living.  He is a thin individual.  He is fairly healthy.  He is now diabetic and not a smoker.  He ambulates using a walker at this point.  He is having difficulty putting on his shoes and socks and crossing his legs due to pain in the groin and stiffness of both hips.  HPI  Review of Systems He currently denies any headache, chest pain, short of breath, fever, chills, nausea, vomiting  Objective: Vital Signs: There were no vitals taken for this visit.  Physical Exam Is alert and orient x3 and in no acute distress Ortho Exam Examination of both hips shows significant pain in the groin with internal and external rotation.  Both hips have significant stiffness with rotation as well. Specialty Comments:  No specialty comments available.  Imaging: No results found. X-rays in the canopy system of the pelvis and hips show complete loss of joint space on both hips.  The superior lateral joint space is gone.  This is the definition of bone-on-bone wear.  There is cystic changes and flattening of the femoral head.  There are sclerotic changes in periarticular  osteophytes.  PMFS History: Patient Active Problem List   Diagnosis Date Noted  . Unilateral primary osteoarthritis, left hip 05/15/2020  . Unilateral primary osteoarthritis, right hip 05/15/2020  . Lumbar stenosis with neurogenic claudication 01/11/2020  . Subacute bronchitis 02/23/2019  . Left lumbar radiculopathy 04/08/2017  . Heart murmur, systolic 49/44/9675  . Hypersomnia 10/12/2015  . Obstructive sleep apnea 02/15/2011  . Foot pain 01/16/2011  . Metatarsalgia of right foot 11/15/2010  . Loss of transverse plantar arch 11/15/2010   Past Medical History:  Diagnosis Date  . Allergy   . Cancer ([facility])    skin cancer  . Cataract    early  . GERD (gastroesophageal reflux disease)   . HLD (hyperlipidemia)   . Hypertension   . Rosacea     . Sleep apnea    cpap  . Systolic murmur     Family History  Problem Relation Age of Onset  . Heart attack Father        pacemaker  . Dementia Mother   . Colon cancer Neg Hx   . Rectal cancer Neg Hx   . Stomach cancer Neg Hx     Past Surgical History:  Procedure Laterality Date  . ABDOMINAL HERNIA REPAIR     x2  . ANTERIOR LAT LUMBAR FUSION N/A 01/11/2020   Procedure: Lumbar Two-three Lumbar Three-Four Anterolateral decompression/fusion;  Surgeon: Kristeen Miss, MD;  Location: [facility];  Service: Neurosurgery;  Laterality: N/A;  anterolateral  . APPLICATION OF ROBOTIC ASSISTANCE FOR SPINAL PROCEDURE N/A 01/11/2020   Procedure: APPLICATION OF ROBOTIC ASSISTANCE FOR SPINAL PROCEDURE;  Surgeon: Kristeen Miss, MD;  Location: [facility];  Service: Neurosurgery;  Laterality: N/A;  posterior  . COLONOSCOPY  04-01-2003   tics and hems  . HEMORRHOID SURGERY    . INGUINAL HERNIA REPAIR    . LUMBAR PERCUTANEOUS PEDICLE SCREW 2 LEVEL N/A 01/11/2020   Procedure: Percutaneous pedicle screw fixation from Lumbar Two to Lumbar Four;  Surgeon: Kristeen Miss, MD;  Location: [facility];  Service: Neurosurgery;  Laterality: N/A;  posterior  . NOSE SURGERY    . TONSILLECTOMY    . uvuloplasty     Social History   Occupational History  . Occupation: Forensic scientist: OTHER  Tobacco Use  . Smoking status: Former Smoker    Types: Cigarettes    Quit date: 07/22/1964    Years since quitting: 55.8  . Smokeless tobacco: Never Used  . Tobacco comment: 02/23/2019 reports smoking socially in high school  Substance and Sexual Activity  . Alcohol use: Yes    Comment: occasionally  . Drug use: No  . Sexual activity: Not on file

## 2020-05-17 DIAGNOSIS — M4326 Fusion of spine, lumbar region: Secondary | ICD-10-CM | POA: Diagnosis not present

## 2020-05-17 DIAGNOSIS — M5416 Radiculopathy, lumbar region: Secondary | ICD-10-CM | POA: Diagnosis not present

## 2020-05-17 DIAGNOSIS — R29898 Other symptoms and signs involving the musculoskeletal system: Secondary | ICD-10-CM | POA: Diagnosis not present

## 2020-05-17 DIAGNOSIS — R531 Weakness: Secondary | ICD-10-CM | POA: Diagnosis not present

## 2020-05-20 DIAGNOSIS — G4730 Sleep apnea, unspecified: Secondary | ICD-10-CM | POA: Diagnosis not present

## 2020-05-20 DIAGNOSIS — G4733 Obstructive sleep apnea (adult) (pediatric): Secondary | ICD-10-CM | POA: Diagnosis not present

## 2020-05-22 ENCOUNTER — Other Ambulatory Visit (HOSPITAL_COMMUNITY): Payer: Self-pay | Admitting: Internal Medicine

## 2020-05-22 ENCOUNTER — Ambulatory Visit (HOSPITAL_COMMUNITY)
Admission: RE | Admit: 2020-05-22 | Discharge: 2020-05-22 | Disposition: A | Discharge status: Home / Self Care | Payer: PPO | Source: Ambulatory Visit | Attending: Internal Medicine | Admitting: Internal Medicine

## 2020-05-22 ENCOUNTER — Other Ambulatory Visit: Payer: Self-pay

## 2020-05-22 DIAGNOSIS — E291 Testicular hypofunction: Secondary | ICD-10-CM | POA: Diagnosis not present

## 2020-05-22 DIAGNOSIS — G4733 Obstructive sleep apnea (adult) (pediatric): Secondary | ICD-10-CM | POA: Diagnosis not present

## 2020-05-22 DIAGNOSIS — R600 Localized edema: Secondary | ICD-10-CM

## 2020-05-22 DIAGNOSIS — R413 Other amnesia: Secondary | ICD-10-CM | POA: Diagnosis not present

## 2020-05-22 DIAGNOSIS — I10 Essential (primary) hypertension: Secondary | ICD-10-CM | POA: Diagnosis not present

## 2020-05-22 DIAGNOSIS — E785 Hyperlipidemia, unspecified: Secondary | ICD-10-CM | POA: Diagnosis not present

## 2020-05-22 DIAGNOSIS — I350 Nonrheumatic aortic (valve) stenosis: Secondary | ICD-10-CM | POA: Diagnosis not present

## 2020-05-22 DIAGNOSIS — I2510 Atherosclerotic heart disease of native coronary artery without angina pectoris: Secondary | ICD-10-CM | POA: Diagnosis not present

## 2020-05-22 DIAGNOSIS — I351 Nonrheumatic aortic (valve) insufficiency: Secondary | ICD-10-CM | POA: Diagnosis not present

## 2020-05-24 DIAGNOSIS — L570 Actinic keratosis: Secondary | ICD-10-CM | POA: Diagnosis not present

## 2020-05-24 DIAGNOSIS — D485 Neoplasm of uncertain behavior of skin: Secondary | ICD-10-CM | POA: Diagnosis not present

## 2020-05-24 DIAGNOSIS — L814 Other melanin hyperpigmentation: Secondary | ICD-10-CM | POA: Diagnosis not present

## 2020-05-31 DIAGNOSIS — I8311 Varicose veins of right lower extremity with inflammation: Secondary | ICD-10-CM | POA: Diagnosis not present

## 2020-05-31 DIAGNOSIS — R600 Localized edema: Secondary | ICD-10-CM | POA: Diagnosis not present

## 2020-06-12 DIAGNOSIS — R351 Nocturia: Secondary | ICD-10-CM | POA: Diagnosis not present

## 2020-06-12 DIAGNOSIS — R3915 Urgency of urination: Secondary | ICD-10-CM | POA: Diagnosis not present

## 2020-06-19 ENCOUNTER — Other Ambulatory Visit: Payer: Self-pay

## 2020-06-19 ENCOUNTER — Other Ambulatory Visit: Payer: Self-pay | Admitting: Family Medicine

## 2020-06-19 ENCOUNTER — Other Ambulatory Visit: Payer: Self-pay | Admitting: Internal Medicine

## 2020-06-19 DIAGNOSIS — R600 Localized edema: Secondary | ICD-10-CM

## 2020-06-19 DIAGNOSIS — R2241 Localized swelling, mass and lump, right lower limb: Secondary | ICD-10-CM

## 2020-06-19 DIAGNOSIS — E291 Testicular hypofunction: Secondary | ICD-10-CM

## 2020-06-20 ENCOUNTER — Ambulatory Visit
Admission: RE | Admit: 2020-06-20 | Discharge: 2020-06-20 | Disposition: A | Discharge status: Home / Self Care | Payer: PPO | Source: Ambulatory Visit | Attending: Family Medicine | Admitting: Family Medicine

## 2020-06-20 DIAGNOSIS — K449 Diaphragmatic hernia without obstruction or gangrene: Secondary | ICD-10-CM | POA: Diagnosis not present

## 2020-06-20 DIAGNOSIS — D1809 Hemangioma of other sites: Secondary | ICD-10-CM | POA: Diagnosis not present

## 2020-06-20 DIAGNOSIS — R600 Localized edema: Secondary | ICD-10-CM

## 2020-06-20 DIAGNOSIS — N400 Enlarged prostate without lower urinary tract symptoms: Secondary | ICD-10-CM | POA: Diagnosis not present

## 2020-06-20 DIAGNOSIS — N3289 Other specified disorders of bladder: Secondary | ICD-10-CM | POA: Diagnosis not present

## 2020-06-20 DIAGNOSIS — R2241 Localized swelling, mass and lump, right lower limb: Secondary | ICD-10-CM

## 2020-06-20 MED ORDER — IOPAMIDOL (ISOVUE-370) INJECTION 76%
100.0000 mL | Freq: Once | INTRAVENOUS | Status: AC | PRN
  Administered 2020-06-20: 100 mL via INTRAVENOUS

## 2020-06-27 ENCOUNTER — Other Ambulatory Visit: Payer: Self-pay | Admitting: Physician Assistant

## 2020-07-03 NOTE — Patient Instructions (Addendum)
DUE TO COVID-19 ONLY ONE VISITOR IS ALLOWED TO COME WITH YOU AND STAY IN THE WAITING ROOM ONLY DURING PRE OP AND PROCEDURE DAY OF SURGERY. THE 1 VISITOR  MAY VISIT WITH YOU AFTER SURGERY IN YOUR PRIVATE ROOM DURING VISITING HOURS ONLY!  YOU NEED TO HAVE A COVID 19 TEST ON_12/14______ @_9 :40______, THIS TEST MUST BE DONE BEFORE SURGERY,  COVID TESTING SITE [place] [address], IT IS ON THE RIGHT GOING OUT [address] APPROXIMATELY  2 MINUTES PAST [employer] ON THE RIGHT. ONCE YOUR COVID TEST IS COMPLETED,  PLEASE BEGIN THE QUARANTINE INSTRUCTIONS AS OUTLINED IN YOUR HANDOUT.                Juan Hudson    Your procedure is scheduled on: 07/07/20   Report to [facility] Main  Entrance   Report to admitting at  9:45 AM     Call this number if you have problems the morning of surgery [place], NO CHEWING GUM [facility].   No food after midnight.     You may have clear liquid until 9:00 AM.     CLEAR LIQUID DIET   Foods Allowed                                                                     Foods Excluded  Coffee and tea, regular and decaf                             liquids that you cannot  Plain Jell-O any favor except red or purple                                           see through such as: Fruit ices (not with fruit pulp)                                     milk, soups, orange juice  Iced Popsicles                                    All solid food Carbonated beverages, regular and diet                                    Cranberry, grape and apple juices Sports drinks like Gatorade Lightly seasoned clear broth or consume(fat free) Sugar, honey syrup        At 8:30 AM drink pre surgery drink.   Nothing by mouth after 9:00 AM.   Take these medicines the morning of surgery with A SIP OF WATER: None Bring your mask and tubing to the hospital  You may not have any metal on your body including               piercings  Do not wear jewelry,  lotions, powders or deodorant                        Men may shave face and neck.   Do not bring valuables to the hospital. [place].  Contacts, dentures or bridgework may not be worn into surgery.       Patients discharged the day of surgery will not be allowed to drive home.   IF YOU ARE HAVING SURGERY AND GOING HOME THE SAME DAY, YOU MUST HAVE AN ADULT TO DRIVE YOU HOME AND BE WITH YOU FOR 24 HOURS.  YOU MAY GO HOME BY TAXI OR UBER OR ORTHERWISE, BUT AN ADULT MUST ACCOMPANY YOU HOME AND STAY WITH YOU FOR 24 HOURS.  Name and phone number of your driver:  Special Instructions: N/A              Please read over the following fact sheets you were given: _____________________________________________________________________             [facility] - Preparing for Surgery Before surgery, you can play an important role .  Because skin is not sterile, your skin needs to be as free of germs as possible.   You can reduce the number of germs on your skin by washing with CHG (chlorahexidine gluconate) soap before surgery.   CHG is an antiseptic cleaner which kills germs and bonds with the skin to continue killing germs even after washing. Please DO NOT use if you have an allergy to CHG or antibacterial soaps.   If your skin becomes reddened/irritated stop using the CHG and inform your nurse when you arrive at Short Stay.  You may shave your face/neck.  Please follow these instructions carefully:  1.  Shower with CHG Soap the night before surgery and the  morning of Surgery.  2.  If you choose to wash your hair, wash your hair first as usual with your  normal  shampoo.  3.  After you shampoo, rinse your hair and body thoroughly to remove the  shampoo.                                        4.  Use CHG as you would  any other liquid soap.  You can apply chg directly  to the skin and wash                       Gently with a scrungie or clean washcloth.  5.  Apply the CHG Soap to your body ONLY FROM THE NECK DOWN.   Do not use on face/ open                           Wound or open sores. Avoid contact with eyes, ears mouth and genitals (private parts).                       Wash face,  Genitals (private parts) with your normal soap.  6.  Wash thoroughly, paying special attention to the area where your surgery  will be performed.  7.  Thoroughly rinse your body with warm water from the neck down.  8.  DO NOT shower/wash with your normal soap after using and rinsing off  the CHG Soap.             9.  Pat yourself dry with a clean towel.            10.  Wear clean pajamas.            11.  Place clean sheets on your bed the night of your first shower and do not  sleep with pets. Day of Surgery : Do not apply any lotions/deodorants the morning of surgery.  Please wear clean clothes to the hospital/surgery center.  FAILURE TO FOLLOW THESE INSTRUCTIONS MAY RESULT IN THE CANCELLATION OF YOUR SURGERY PATIENT SIGNATURE_________________________________  NURSE SIGNATURE__________________________________  ________________________________________________________________________   Juan Hudson  An incentive spirometer is a tool that can help keep your lungs clear and active. This tool measures how well you are filling your lungs with each breath. Taking long deep breaths may help reverse or decrease the chance of developing breathing (pulmonary) problems (especially infection) following:  A long period of time when you are unable to move or be active. BEFORE THE PROCEDURE   If the spirometer includes an indicator to show your best effort, your nurse or respiratory therapist will set it to a desired goal.  If possible, sit up straight or lean slightly forward. Try not to slouch.  Hold the  incentive spirometer in an upright position. INSTRUCTIONS FOR USE  1. Sit on the edge of your bed if possible, or sit up as far as you can in bed or on a chair. 2. Hold the incentive spirometer in an upright position. 3. Breathe out normally. 4. Place the mouthpiece in your mouth and seal your lips tightly around it. 5. Breathe in slowly and as deeply as possible, raising the piston or the ball toward the top of the column. 6. Hold your breath for 3-5 seconds or for as long as possible. Allow the piston or ball to fall to the bottom of the column. 7. Remove the mouthpiece from your mouth and breathe out normally. 8. Rest for a few seconds and repeat Steps 1 through 7 at least 10 times every 1-2 hours when you are awake. Take your time and take a few normal breaths between deep breaths. 9. The spirometer may include an indicator to show your best effort. Use the indicator as a goal to work toward during each repetition. 10. After each set of 10 deep breaths, practice coughing to be sure your lungs are clear. If you have an incision (the cut made at the time of surgery), support your incision when coughing by placing a pillow or rolled up towels firmly against it. Once you are able to get out of bed, walk around indoors and cough well. You may stop using the incentive spirometer when instructed by your caregiver.  RISKS AND COMPLICATIONS  Take your time so you do not get dizzy or light-headed.  If you are in pain, you may need to take or ask for pain medication before doing incentive spirometry. It is harder to take a deep breath if you are having pain. AFTER USE  Rest and breathe slowly and easily.  It can be helpful to keep track of a log of your progress. Your caregiver  can provide you with a simple table to help with this. If you are using the spirometer at home, follow these instructions: [place] IF:   You are having difficultly using the spirometer.  You have trouble using  the spirometer as often as instructed.  Your pain medication is not giving enough relief while using the spirometer.  You develop fever of 100.5 F (38.1 C) or higher. SEEK IMMEDIATE MEDICAL CARE IF:   You cough up bloody sputum that had not been present before.  You develop fever of 102 F (38.9 C) or greater.  You develop worsening pain at or near the incision site. MAKE SURE YOU:   Understand these instructions.  Will watch your condition.  Will get help right away if you are not doing well or get worse. Document Released: 11/18/2006 Document Revised: 09/30/2011 Document Reviewed: 01/19/2007 [facility] Patient Information 2014 [place], [place].   ________________________________________________________________________

## 2020-07-04 ENCOUNTER — Other Ambulatory Visit: Payer: Self-pay

## 2020-07-04 ENCOUNTER — Other Ambulatory Visit (HOSPITAL_COMMUNITY)
Admission: RE | Admit: 2020-07-04 | Discharge: 2020-07-04 | Disposition: A | Discharge status: Home / Self Care | Payer: PPO | Source: Ambulatory Visit | Attending: Orthopaedic Surgery | Admitting: Orthopaedic Surgery

## 2020-07-04 ENCOUNTER — Encounter (HOSPITAL_COMMUNITY): Payer: Self-pay

## 2020-07-04 ENCOUNTER — Encounter (HOSPITAL_COMMUNITY)
Admission: RE | Admit: 2020-07-04 | Discharge: 2020-07-04 | Disposition: A | Discharge status: Home / Self Care | Payer: PPO | Source: Ambulatory Visit | Attending: Orthopaedic Surgery | Admitting: Orthopaedic Surgery

## 2020-07-04 DIAGNOSIS — Z01812 Encounter for preprocedural laboratory examination: Secondary | ICD-10-CM | POA: Insufficient documentation

## 2020-07-04 DIAGNOSIS — Z20822 Contact with and (suspected) exposure to covid-19: Secondary | ICD-10-CM | POA: Insufficient documentation

## 2020-07-04 LAB — CBC
HCT: 46.8 % (ref 39.0–52.0)
Hemoglobin: 15.4 g/dL (ref 13.0–17.0)
MCH: 30.1 pg (ref 26.0–34.0)
MCHC: 32.9 g/dL (ref 30.0–36.0)
MCV: 91.6 fL (ref 80.0–100.0)
Platelets: 194 10*3/uL (ref 150–400)
RBC: 5.11 MIL/uL (ref 4.22–5.81)
RDW: 16 % — ABNORMAL HIGH (ref 11.5–15.5)
WBC: 6.3 10*3/uL (ref 4.0–10.5)
nRBC: 0 % (ref 0.0–0.2)

## 2020-07-04 LAB — SARS CORONAVIRUS 2 (TAT 6-24 HRS): SARS Coronavirus 2: NEGATIVE

## 2020-07-04 LAB — SURGICAL PCR SCREEN
MRSA, PCR: NEGATIVE
Staphylococcus aureus: NEGATIVE

## 2020-07-04 NOTE — Progress Notes (Signed)
COVID Vaccine Completed:Yes Date COVID Vaccine completed:09/01/19- Booster 03/20/20 COVID vaccine manufacturer: Pfizer      PCP - Dr. Jerilynn Mages. [place] Cardiologist - no  Chest x-ray - 02/23/19 Epic EKG - 01/05/20-Epic Stress Test - 2005 ECHO - 12/25/18- Epic Cardiac Cath - no Pacemaker/ICD device last checked:NA  Sleep Study - yes CPAP - yes  Fasting Blood Sugar - NA Checks Blood Sugar _____ times a day  Blood Thinner Instructions:ASA/ [place] Aspirin Instructions:continue/ [place] Last Dose:  Anesthesia review:   Patient denies shortness of breath, fever, cough and chest pain at PAT appointment yes   Patient verbalized understanding of instructions that were given to them at the PAT appointment. Patient was also instructed that they will need to review over the PAT instructions again at home before surgery. Yes Pt is able to climb stairs, work around the house and ADLs without SOB.

## 2020-07-06 NOTE — H&P (Signed)
TOTAL HIP ADMISSION H&P  Patient is admitted for right total hip arthroplasty.  Subjective:  Chief Complaint: right hip pain  HPI: Juan Hudson, 74 y.o. male, has a history of pain and functional disability in the right hip(s) due to arthritis and patient has failed non-surgical conservative treatments for greater than 12 weeks to include NSAID's and/or analgesics, use of assistive devices and activity modification.  Onset of symptoms was abrupt starting 1 years ago with rapidlly worsening course since that time.The patient noted no past surgery on the right hip(s).  Patient currently rates pain in the right hip at 10 out of 10 with activity. Patient has night pain, worsening of pain with activity and weight bearing, trendelenberg gait, pain that interfers with activities of daily living, pain with passive range of motion and crepitus. Patient has evidence of subchondral cysts, subchondral sclerosis, periarticular osteophytes and joint space narrowing by imaging studies. This condition presents safety issues increasing the risk of falls.  There is no current active infection.  Patient Active Problem List   Diagnosis Date Noted  . Unilateral primary osteoarthritis, left hip 05/15/2020  . Unilateral primary osteoarthritis, right hip 05/15/2020  . Lumbar stenosis with neurogenic claudication 01/11/2020  . Subacute bronchitis 02/23/2019  . Left lumbar radiculopathy 04/08/2017  . Heart murmur, systolic 47/03/6282  . Hypersomnia 10/12/2015  . Obstructive sleep apnea 02/15/2011  . Foot pain 01/16/2011  . Metatarsalgia of right foot 11/15/2010  . Loss of transverse plantar arch 11/15/2010   Past Medical History:  Diagnosis Date  . Allergy   . Cancer ([place])    skin cancer  . Cataract    early  . GERD (gastroesophageal reflux disease)   . HLD (hyperlipidemia)   . Hypertension   . Rosacea   . Sleep apnea    cpap  . Systolic murmur     Past Surgical History:  Procedure Laterality Date   . ABDOMINAL HERNIA REPAIR     x2  . ANTERIOR LAT LUMBAR FUSION N/A 01/11/2020   Procedure: Lumbar Two-three Lumbar Three-Four Anterolateral decompression/fusion;  Surgeon: Kristeen Miss, MD;  Location: [facility];  Service: Neurosurgery;  Laterality: N/A;  anterolateral  . APPLICATION OF ROBOTIC ASSISTANCE FOR SPINAL PROCEDURE N/A 01/11/2020   Procedure: APPLICATION OF ROBOTIC ASSISTANCE FOR SPINAL PROCEDURE;  Surgeon: Kristeen Miss, MD;  Location: [facility];  Service: Neurosurgery;  Laterality: N/A;  posterior  . COLONOSCOPY  04-01-2003   tics and hems  . HEMORRHOID SURGERY    . INGUINAL HERNIA REPAIR    . LUMBAR PERCUTANEOUS PEDICLE SCREW 2 LEVEL N/A 01/11/2020   Procedure: Percutaneous pedicle screw fixation from Lumbar Two to Lumbar Four;  Surgeon: Kristeen Miss, MD;  Location: [facility];  Service: Neurosurgery;  Laterality: N/A;  posterior  . NOSE SURGERY    . TONSILLECTOMY    . uvuloplasty      No current facility-administered medications for this encounter.   Current Outpatient Medications  Medication Sig Dispense Refill Last Dose  . ascorbic acid (VITAMIN C) 500 MG tablet Take 500 mg by mouth daily.     Marland Kitchen aspirin 81 MG tablet Take 81 mg by mouth at bedtime.      . B Complex Vitamins (B COMPLEX 100 PO) Take 1 tablet by mouth daily.     . Cholecalciferol (VITAMIN D) 125 MCG (5000 UT) CAPS Take 5,000 Units by mouth daily.     . irbesartan-hydrochlorothiazide (AVALIDE) 300-12.5 MG tablet Take 1 tablet by mouth at bedtime.     Marland Kitchen  Multiple Vitamins-Minerals (CENTRUM) tablet Take 1 tablet by mouth daily.      . Omega-3 Fatty Acids (FISH OIL) 1000 MG CAPS Take 1,000 mg by mouth daily.      . pravastatin (PRAVACHOL) 40 MG tablet Take 40 mg by mouth daily.      Marland Kitchen testosterone cypionate (DEPOTESTOSTERONE CYPIONATE) 200 MG/ML injection Inject 100 mg into the muscle every Saturday.   1   . Zinc 25 MG TABS Take 25 mg by mouth daily.      Allergies  Allergen Reactions  . Codeine Nausea Only    Social  History   Tobacco Use  . Smoking status: Former Smoker    Types: Cigarettes    Quit date: 07/22/1964    Years since quitting: 55.9  . Smokeless tobacco: Never Used  . Tobacco comment: 02/23/2019 reports smoking socially in high school  Substance Use Topics  . Alcohol use: Yes    Alcohol/week: 4.0 standard drinks    Types: 4 Standard drinks or equivalent per week    Comment: occasionally    Family History  Problem Relation Age of Onset  . Heart attack Father        pacemaker  . Dementia Mother   . Colon cancer Neg Hx   . Rectal cancer Neg Hx   . Stomach cancer Neg Hx      Review of Systems  Musculoskeletal: Positive for back pain and gait problem.  All other systems reviewed and are negative.   Objective:  Physical Exam Vitals reviewed.  Constitutional:      Appearance: Normal appearance.  HENT:     Head: Normocephalic and atraumatic.  Eyes:     Extraocular Movements: Extraocular movements intact.     Pupils: Pupils are equal, round, and reactive to light.  Cardiovascular:     Rate and Rhythm: Normal rate.     Pulses: Normal pulses.  Pulmonary:     Effort: Pulmonary effort is normal.  Abdominal:     Palpations: Abdomen is soft.  Musculoskeletal:     Cervical back: Normal range of motion.     Right hip: Tenderness and bony tenderness present. Decreased range of motion. Decreased strength.     Left hip: Tenderness and bony tenderness present. Decreased range of motion. Decreased strength.  Neurological:     Mental Status: He is alert and oriented to person, place, and time.  Psychiatric:        Behavior: Behavior normal.     Vital signs in last 24 hours:    Labs:   Estimated body mass index is 27.55 kg/m as calculated from the following:   Height as of 07/04/20: 5\' 10"  (1.778 m).   Weight as of 07/04/20: 87.1 kg.   Imaging Review Plain radiographs demonstrate severe degenerative joint disease of the right hip(s). The bone quality appears to be good  for age and reported activity level.      Assessment/Plan:  End stage arthritis, right hip(s)  The patient history, physical examination, clinical judgement of the provider and imaging studies are consistent with end stage degenerative joint disease of the right hip(s) and total hip arthroplasty is deemed medically necessary. The treatment options including medical management, injection therapy, arthroscopy and arthroplasty were discussed at length. The risks and benefits of total hip arthroplasty were presented and reviewed. The risks due to aseptic loosening, infection, stiffness, dislocation/subluxation,  thromboembolic complications and other imponderables were discussed.  The patient acknowledged the explanation, agreed to proceed with the plan and  consent was signed. Patient is being admitted for inpatient treatment for surgery, pain control, PT, OT, prophylactic antibiotics, VTE prophylaxis, progressive ambulation and ADL's and discharge planning.The patient is planning to be discharged home with home health services

## 2020-07-07 ENCOUNTER — Other Ambulatory Visit: Payer: Self-pay

## 2020-07-07 ENCOUNTER — Encounter (HOSPITAL_COMMUNITY)
Admission: RE | Disposition: A | Discharge status: Skilled Nursing Facility | Payer: Self-pay | Source: Home / Self Care | Attending: Orthopaedic Surgery

## 2020-07-07 ENCOUNTER — Telehealth (HOSPITAL_COMMUNITY): Payer: Self-pay | Admitting: *Deleted

## 2020-07-07 ENCOUNTER — Ambulatory Visit (HOSPITAL_COMMUNITY): Payer: PPO | Admitting: Registered Nurse

## 2020-07-07 ENCOUNTER — Ambulatory Visit (HOSPITAL_COMMUNITY): Payer: PPO

## 2020-07-07 ENCOUNTER — Inpatient Hospital Stay (HOSPITAL_COMMUNITY)
Admission: RE | Admit: 2020-07-07 | Discharge: 2020-07-11 | DRG: 470 | Disposition: A | Discharge status: Skilled Nursing Facility | Payer: PPO | Attending: Orthopaedic Surgery | Admitting: Orthopaedic Surgery

## 2020-07-07 ENCOUNTER — Encounter (HOSPITAL_COMMUNITY): Payer: Self-pay | Admitting: Orthopaedic Surgery

## 2020-07-07 ENCOUNTER — Observation Stay (HOSPITAL_COMMUNITY): Payer: PPO

## 2020-07-07 DIAGNOSIS — M160 Bilateral primary osteoarthritis of hip: Principal | ICD-10-CM | POA: Diagnosis present

## 2020-07-07 DIAGNOSIS — Z471 Aftercare following joint replacement surgery: Secondary | ICD-10-CM | POA: Diagnosis not present

## 2020-07-07 DIAGNOSIS — M25451 Effusion, right hip: Secondary | ICD-10-CM | POA: Diagnosis present

## 2020-07-07 DIAGNOSIS — Z9989 Dependence on other enabling machines and devices: Secondary | ICD-10-CM | POA: Diagnosis not present

## 2020-07-07 DIAGNOSIS — Z87891 Personal history of nicotine dependence: Secondary | ICD-10-CM

## 2020-07-07 DIAGNOSIS — Z79890 Hormone replacement therapy (postmenopausal): Secondary | ICD-10-CM | POA: Diagnosis not present

## 2020-07-07 DIAGNOSIS — I10 Essential (primary) hypertension: Secondary | ICD-10-CM | POA: Diagnosis present

## 2020-07-07 DIAGNOSIS — Z96641 Presence of right artificial hip joint: Secondary | ICD-10-CM | POA: Diagnosis not present

## 2020-07-07 DIAGNOSIS — Z981 Arthrodesis status: Secondary | ICD-10-CM | POA: Diagnosis not present

## 2020-07-07 DIAGNOSIS — G4733 Obstructive sleep apnea (adult) (pediatric): Secondary | ICD-10-CM | POA: Diagnosis present

## 2020-07-07 DIAGNOSIS — E785 Hyperlipidemia, unspecified: Secondary | ICD-10-CM | POA: Diagnosis present

## 2020-07-07 DIAGNOSIS — Z85828 Personal history of other malignant neoplasm of skin: Secondary | ICD-10-CM

## 2020-07-07 DIAGNOSIS — Z885 Allergy status to narcotic agent status: Secondary | ICD-10-CM

## 2020-07-07 DIAGNOSIS — G4700 Insomnia, unspecified: Secondary | ICD-10-CM | POA: Diagnosis not present

## 2020-07-07 DIAGNOSIS — R42 Dizziness and giddiness: Secondary | ICD-10-CM | POA: Diagnosis not present

## 2020-07-07 DIAGNOSIS — Z419 Encounter for procedure for purposes other than remedying health state, unspecified: Secondary | ICD-10-CM

## 2020-07-07 DIAGNOSIS — Z7982 Long term (current) use of aspirin: Secondary | ICD-10-CM | POA: Diagnosis not present

## 2020-07-07 DIAGNOSIS — Z79899 Other long term (current) drug therapy: Secondary | ICD-10-CM | POA: Diagnosis not present

## 2020-07-07 DIAGNOSIS — Z96642 Presence of left artificial hip joint: Secondary | ICD-10-CM

## 2020-07-07 DIAGNOSIS — Z8249 Family history of ischemic heart disease and other diseases of the circulatory system: Secondary | ICD-10-CM | POA: Diagnosis not present

## 2020-07-07 DIAGNOSIS — G4730 Sleep apnea, unspecified: Secondary | ICD-10-CM | POA: Diagnosis not present

## 2020-07-07 DIAGNOSIS — K219 Gastro-esophageal reflux disease without esophagitis: Secondary | ICD-10-CM | POA: Diagnosis present

## 2020-07-07 DIAGNOSIS — M1611 Unilateral primary osteoarthritis, right hip: Secondary | ICD-10-CM | POA: Diagnosis present

## 2020-07-07 DIAGNOSIS — Z20822 Contact with and (suspected) exposure to covid-19: Secondary | ICD-10-CM | POA: Diagnosis present

## 2020-07-07 DIAGNOSIS — M1612 Unilateral primary osteoarthritis, left hip: Secondary | ICD-10-CM | POA: Diagnosis not present

## 2020-07-07 HISTORY — PX: TOTAL HIP ARTHROPLASTY: SHX124

## 2020-07-07 LAB — TYPE AND SCREEN
ABO/RH(D): O POS
Antibody Screen: NEGATIVE

## 2020-07-07 SURGERY — ARTHROPLASTY, HIP, TOTAL, ANTERIOR APPROACH
Anesthesia: Spinal | Site: Hip | Laterality: Right

## 2020-07-07 MED ORDER — OXYCODONE HCL 5 MG PO TABS
5.0000 mg | ORAL_TABLET | ORAL | Status: DC | PRN
Start: 2020-07-07 — End: 2020-07-11
  Administered 2020-07-07: 10 mg via ORAL
  Administered 2020-07-08: 5 mg via ORAL
  Administered 2020-07-08 – 2020-07-10 (×6): 10 mg via ORAL
  Administered 2020-07-11: 5 mg via ORAL
  Filled 2020-07-07 (×3): qty 2
  Filled 2020-07-07 (×2): qty 1
  Filled 2020-07-07 (×4): qty 2

## 2020-07-07 MED ORDER — POVIDONE-IODINE 10 % EX SWAB
2.0000 "application " | Freq: Once | CUTANEOUS | Status: AC
  Administered 2020-07-07: 2 via TOPICAL

## 2020-07-07 MED ORDER — ASPIRIN 81 MG PO CHEW
81.0000 mg | CHEWABLE_TABLET | Freq: Two times a day (BID) | ORAL | Status: DC
  Administered 2020-07-07 – 2020-07-11 (×8): 81 mg via ORAL
  Filled 2020-07-07 (×8): qty 1

## 2020-07-07 MED ORDER — FENTANYL CITRATE (PF) 100 MCG/2ML IJ SOLN
INTRAMUSCULAR | Status: DC | PRN
  Administered 2020-07-07: 50 ug via INTRAVENOUS

## 2020-07-07 MED ORDER — MIDAZOLAM HCL 2 MG/2ML IJ SOLN
INTRAMUSCULAR | Status: AC
  Filled 2020-07-07: qty 2

## 2020-07-07 MED ORDER — METHOCARBAMOL 500 MG IVPB - SIMPLE MED
500.0000 mg | Freq: Four times a day (QID) | INTRAVENOUS | Status: DC | PRN
  Filled 2020-07-07: qty 50

## 2020-07-07 MED ORDER — DEXAMETHASONE SODIUM PHOSPHATE 10 MG/ML IJ SOLN
INTRAMUSCULAR | Status: DC | PRN
  Administered 2020-07-07: 8 mg via INTRAVENOUS

## 2020-07-07 MED ORDER — FENTANYL CITRATE (PF) 100 MCG/2ML IJ SOLN: 25.0000 ug | INTRAMUSCULAR | Status: DC | PRN

## 2020-07-07 MED ORDER — PROPOFOL 500 MG/50ML IV EMUL
INTRAVENOUS | Status: DC | PRN
  Administered 2020-07-07: 40 ug/kg/min via INTRAVENOUS

## 2020-07-07 MED ORDER — TRANEXAMIC ACID-NACL 1000-0.7 MG/100ML-% IV SOLN
1000.0000 mg | INTRAVENOUS | Status: AC
  Administered 2020-07-07: 13:00:00 1000 mg via INTRAVENOUS
  Filled 2020-07-07: qty 100

## 2020-07-07 MED ORDER — PROPOFOL 1000 MG/100ML IV EMUL
INTRAVENOUS | Status: AC
  Filled 2020-07-07: qty 100

## 2020-07-07 MED ORDER — PHENYLEPHRINE 40 MCG/ML (10ML) SYRINGE FOR IV PUSH (FOR BLOOD PRESSURE SUPPORT)
PREFILLED_SYRINGE | INTRAVENOUS | Status: DC | PRN
  Administered 2020-07-07: 80 ug via INTRAVENOUS

## 2020-07-07 MED ORDER — STERILE WATER FOR IRRIGATION IR SOLN
Status: DC | PRN
  Administered 2020-07-07: 2000 mL

## 2020-07-07 MED ORDER — 0.9 % SODIUM CHLORIDE (POUR BTL) OPTIME
TOPICAL | Status: DC | PRN
  Administered 2020-07-07: 14:00:00 1000 mL

## 2020-07-07 MED ORDER — METOCLOPRAMIDE HCL 5 MG PO TABS: 5.0000 mg | ORAL_TABLET | Freq: Three times a day (TID) | ORAL | Status: DC | PRN

## 2020-07-07 MED ORDER — CEFAZOLIN SODIUM-DEXTROSE 1-4 GM/50ML-% IV SOLN
1.0000 g | Freq: Four times a day (QID) | INTRAVENOUS | Status: AC
  Administered 2020-07-07 – 2020-07-08 (×2): 1 g via INTRAVENOUS
  Filled 2020-07-07 (×2): qty 50

## 2020-07-07 MED ORDER — FENTANYL CITRATE (PF) 100 MCG/2ML IJ SOLN
INTRAMUSCULAR | Status: AC
  Filled 2020-07-07: qty 2

## 2020-07-07 MED ORDER — ONDANSETRON HCL 4 MG/2ML IJ SOLN
INTRAMUSCULAR | Status: DC | PRN
  Administered 2020-07-07: 4 mg via INTRAVENOUS

## 2020-07-07 MED ORDER — MENTHOL 3 MG MT LOZG: 1.0000 | LOZENGE | OROMUCOSAL | Status: DC | PRN

## 2020-07-07 MED ORDER — LACTATED RINGERS IV SOLN: INTRAVENOUS | Status: DC

## 2020-07-07 MED ORDER — ACETAMINOPHEN 325 MG PO TABS
325.0000 mg | ORAL_TABLET | Freq: Four times a day (QID) | ORAL | Status: DC | PRN
  Administered 2020-07-08: 650 mg via ORAL
  Filled 2020-07-07: qty 2

## 2020-07-07 MED ORDER — METHOCARBAMOL 500 MG PO TABS
500.0000 mg | ORAL_TABLET | Freq: Four times a day (QID) | ORAL | Status: DC | PRN
  Administered 2020-07-07 – 2020-07-10 (×8): 500 mg via ORAL
  Filled 2020-07-07 (×8): qty 1

## 2020-07-07 MED ORDER — GABAPENTIN 100 MG PO CAPS
100.0000 mg | ORAL_CAPSULE | Freq: Three times a day (TID) | ORAL | Status: DC
  Administered 2020-07-07 – 2020-07-11 (×12): 100 mg via ORAL
  Filled 2020-07-07 (×12): qty 1

## 2020-07-07 MED ORDER — ZINC SULFATE 220 (50 ZN) MG PO CAPS
220.0000 mg | ORAL_CAPSULE | Freq: Every day | ORAL | Status: DC
  Administered 2020-07-08 – 2020-07-11 (×4): 220 mg via ORAL
  Filled 2020-07-07 (×4): qty 1

## 2020-07-07 MED ORDER — ORAL CARE MOUTH RINSE: 15.0000 mL | Freq: Once | OROMUCOSAL | Status: AC

## 2020-07-07 MED ORDER — PHENYLEPHRINE HCL-NACL 20-0.9 MG/250ML-% IV SOLN
INTRAVENOUS | Status: DC | PRN
  Administered 2020-07-07: 15 ug/min via INTRAVENOUS

## 2020-07-07 MED ORDER — CEFAZOLIN SODIUM-DEXTROSE 2-4 GM/100ML-% IV SOLN
2.0000 g | INTRAVENOUS | Status: AC
  Administered 2020-07-07: 13:00:00 2 g via INTRAVENOUS
  Filled 2020-07-07: qty 100

## 2020-07-07 MED ORDER — METOCLOPRAMIDE HCL 5 MG/ML IJ SOLN: 5.0000 mg | Freq: Three times a day (TID) | INTRAMUSCULAR | Status: DC | PRN

## 2020-07-07 MED ORDER — ALUM & MAG HYDROXIDE-SIMETH 200-200-20 MG/5ML PO SUSP: 30.0000 mL | ORAL | Status: DC | PRN

## 2020-07-07 MED ORDER — DOCUSATE SODIUM 100 MG PO CAPS
100.0000 mg | ORAL_CAPSULE | Freq: Two times a day (BID) | ORAL | Status: DC
  Administered 2020-07-07 – 2020-07-11 (×8): 100 mg via ORAL
  Filled 2020-07-07 (×8): qty 1

## 2020-07-07 MED ORDER — ONDANSETRON HCL 4 MG/2ML IJ SOLN
INTRAMUSCULAR | Status: AC
  Filled 2020-07-07: qty 2

## 2020-07-07 MED ORDER — PHENYLEPHRINE 40 MCG/ML (10ML) SYRINGE FOR IV PUSH (FOR BLOOD PRESSURE SUPPORT)
PREFILLED_SYRINGE | INTRAVENOUS | Status: AC
  Filled 2020-07-07: qty 10

## 2020-07-07 MED ORDER — IRBESARTAN-HYDROCHLOROTHIAZIDE 300-12.5 MG PO TABS: 1.0000 | ORAL_TABLET | Freq: Every day | ORAL | Status: DC

## 2020-07-07 MED ORDER — DIPHENHYDRAMINE HCL 12.5 MG/5ML PO ELIX: 12.5000 mg | ORAL_SOLUTION | ORAL | Status: DC | PRN

## 2020-07-07 MED ORDER — ASCORBIC ACID 500 MG PO TABS
500.0000 mg | ORAL_TABLET | Freq: Every day | ORAL | Status: DC
  Administered 2020-07-08 – 2020-07-11 (×4): 500 mg via ORAL
  Filled 2020-07-07 (×4): qty 1

## 2020-07-07 MED ORDER — BUPIVACAINE IN DEXTROSE 0.75-8.25 % IT SOLN
INTRATHECAL | Status: DC | PRN
  Administered 2020-07-07: 2 mL via INTRATHECAL

## 2020-07-07 MED ORDER — OXYCODONE HCL 5 MG/5ML PO SOLN
5.0000 mg | Freq: Once | ORAL | Status: DC | PRN
Start: 2020-07-07 — End: 2020-07-07

## 2020-07-07 MED ORDER — OXYCODONE HCL 5 MG PO TABS
5.0000 mg | ORAL_TABLET | Freq: Once | ORAL | Status: DC | PRN
Start: 2020-07-07 — End: 2020-07-07

## 2020-07-07 MED ORDER — PANTOPRAZOLE SODIUM 40 MG PO TBEC
40.0000 mg | DELAYED_RELEASE_TABLET | Freq: Every day | ORAL | Status: DC
  Administered 2020-07-07 – 2020-07-11 (×5): 40 mg via ORAL
  Filled 2020-07-07 (×5): qty 1

## 2020-07-07 MED ORDER — VITAMIN D 25 MCG (1000 UNIT) PO TABS
5000.0000 [IU] | ORAL_TABLET | Freq: Every day | ORAL | Status: DC
  Administered 2020-07-07 – 2020-07-11 (×6): 5000 [IU] via ORAL
  Filled 2020-07-07 (×5): qty 5

## 2020-07-07 MED ORDER — HYDROCHLOROTHIAZIDE 12.5 MG PO CAPS
12.5000 mg | ORAL_CAPSULE | Freq: Every day | ORAL | Status: DC
  Administered 2020-07-07 – 2020-07-10 (×4): 12.5 mg via ORAL
  Filled 2020-07-07 (×4): qty 1

## 2020-07-07 MED ORDER — SODIUM CHLORIDE 0.9 % IR SOLN
Status: DC | PRN
  Administered 2020-07-07: 1000 mL

## 2020-07-07 MED ORDER — PRAVASTATIN SODIUM 20 MG PO TABS
40.0000 mg | ORAL_TABLET | Freq: Every day | ORAL | Status: DC
  Administered 2020-07-08 – 2020-07-11 (×4): 40 mg via ORAL
  Filled 2020-07-07 (×4): qty 2

## 2020-07-07 MED ORDER — DEXAMETHASONE SODIUM PHOSPHATE 10 MG/ML IJ SOLN
INTRAMUSCULAR | Status: AC
  Filled 2020-07-07: qty 1

## 2020-07-07 MED ORDER — PHENOL 1.4 % MT LIQD: 1.0000 | OROMUCOSAL | Status: DC | PRN

## 2020-07-07 MED ORDER — OXYCODONE HCL 5 MG PO TABS
10.0000 mg | ORAL_TABLET | ORAL | Status: DC | PRN
Start: 2020-07-07 — End: 2020-07-11
  Administered 2020-07-08 – 2020-07-09 (×2): 10 mg via ORAL
  Filled 2020-07-07 (×3): qty 2

## 2020-07-07 MED ORDER — ONDANSETRON HCL 4 MG PO TABS: 4.0000 mg | ORAL_TABLET | Freq: Four times a day (QID) | ORAL | Status: DC | PRN

## 2020-07-07 MED ORDER — ONDANSETRON HCL 4 MG/2ML IJ SOLN: 4.0000 mg | Freq: Once | INTRAMUSCULAR | Status: DC | PRN

## 2020-07-07 MED ORDER — MIDAZOLAM HCL 5 MG/5ML IJ SOLN
INTRAMUSCULAR | Status: DC | PRN
  Administered 2020-07-07: 2 mg via INTRAVENOUS

## 2020-07-07 MED ORDER — IRBESARTAN 150 MG PO TABS
300.0000 mg | ORAL_TABLET | Freq: Every day | ORAL | Status: DC
  Administered 2020-07-07 – 2020-07-10 (×4): 300 mg via ORAL
  Filled 2020-07-07 (×4): qty 2

## 2020-07-07 MED ORDER — AMISULPRIDE (ANTIEMETIC) 5 MG/2ML IV SOLN: 10.0000 mg | Freq: Once | INTRAVENOUS | Status: DC | PRN

## 2020-07-07 MED ORDER — CHLORHEXIDINE GLUCONATE 0.12 % MT SOLN
15.0000 mL | Freq: Once | OROMUCOSAL | Status: AC
  Administered 2020-07-07: 11:00:00 15 mL via OROMUCOSAL

## 2020-07-07 MED ORDER — HYDROMORPHONE HCL 1 MG/ML IJ SOLN
0.5000 mg | INTRAMUSCULAR | Status: DC | PRN
  Filled 2020-07-07: qty 1

## 2020-07-07 MED ORDER — SODIUM CHLORIDE 0.9 % IV SOLN: INTRAVENOUS | Status: DC

## 2020-07-07 MED ORDER — ONDANSETRON HCL 4 MG/2ML IJ SOLN: 4.0000 mg | Freq: Four times a day (QID) | INTRAMUSCULAR | Status: DC | PRN

## 2020-07-07 SURGICAL SUPPLY — 44 items
ACETAB CUP W GRIPTION 54MM (Plate) ×1 IMPLANT
ACETAB CUP W/GRIPTION 54 (Plate) ×2 IMPLANT
APL SKNCLS STERI-STRIP NONHPOA (GAUZE/BANDAGES/DRESSINGS)
BAG SPEC THK2 15X12 ZIP CLS (MISCELLANEOUS)
BAG ZIPLOCK 12X15 (MISCELLANEOUS) IMPLANT
BENZOIN TINCTURE PRP APPL 2/3 (GAUZE/BANDAGES/DRESSINGS) IMPLANT
BLADE SAW SGTL 18X1.27X75 (BLADE) ×2 IMPLANT
BLADE SAW SGTL 18X1.27X75MM (BLADE) ×1
CLOSURE WOUND 1/2 X4 (GAUZE/BANDAGES/DRESSINGS)
COVER PERINEAL POST (MISCELLANEOUS) ×3 IMPLANT
COVER SURGICAL LIGHT HANDLE (MISCELLANEOUS) ×3 IMPLANT
COVER WAND RF STERILE (DRAPES) ×3 IMPLANT
CUP ACETAB W/GRIPTION 54 (Plate) IMPLANT
DRAPE STERI IOBAN 125X83 (DRAPES) ×3 IMPLANT
DRAPE U-SHAPE 47X51 STRL (DRAPES) ×6 IMPLANT
DRSG AQUACEL AG ADV 3.5X10 (GAUZE/BANDAGES/DRESSINGS) ×3 IMPLANT
DURAPREP 26ML APPLICATOR (WOUND CARE) ×3 IMPLANT
ELECT REM PT RETURN 15FT ADLT (MISCELLANEOUS) ×3 IMPLANT
GAUZE XEROFORM 1X8 LF (GAUZE/BANDAGES/DRESSINGS) ×3 IMPLANT
GLOVE BIO SURGEON STRL SZ7.5 (GLOVE) ×3 IMPLANT
GLOVE BIOGEL PI IND STRL 8 (GLOVE) ×2 IMPLANT
GLOVE BIOGEL PI INDICATOR 8 (GLOVE) ×4
GLOVE ECLIPSE 8.0 STRL XLNG CF (GLOVE) ×3 IMPLANT
GOWN STRL REUS W/TWL XL LVL3 (GOWN DISPOSABLE) ×6 IMPLANT
HANDPIECE INTERPULSE COAX TIP (DISPOSABLE) ×3
HEAD M SROM 36MM 2 (Hips) IMPLANT
HOLDER FOLEY CATH W/STRAP (MISCELLANEOUS) ×3 IMPLANT
KIT TURNOVER KIT A (KITS) IMPLANT
LINER NEUTRAL 36ID 54OD (Liner) ×2 IMPLANT
PACK ANTERIOR HIP CUSTOM (KITS) ×3 IMPLANT
PENCIL SMOKE EVACUATOR (MISCELLANEOUS) IMPLANT
SET HNDPC FAN SPRY TIP SCT (DISPOSABLE) ×1 IMPLANT
SROM M HEAD 36MM 2 (Hips) ×3 IMPLANT
STAPLER VISISTAT 35W (STAPLE) IMPLANT
STEM CORAIL KA12 (Stem) ×2 IMPLANT
STRIP CLOSURE SKIN 1/2X4 (GAUZE/BANDAGES/DRESSINGS) IMPLANT
SUT ETHIBOND NAB CT1 #1 30IN (SUTURE) ×3 IMPLANT
SUT ETHILON 2 0 PS N (SUTURE) IMPLANT
SUT MNCRL AB 4-0 PS2 18 (SUTURE) IMPLANT
SUT VIC AB 0 CT1 36 (SUTURE) ×3 IMPLANT
SUT VIC AB 1 CT1 36 (SUTURE) ×3 IMPLANT
SUT VIC AB 2-0 CT1 27 (SUTURE) ×6
SUT VIC AB 2-0 CT1 TAPERPNT 27 (SUTURE) ×2 IMPLANT
TRAY FOLEY MTR SLVR 16FR STAT (SET/KITS/TRAYS/PACK) IMPLANT

## 2020-07-07 NOTE — Interval H&P Note (Signed)
History and Physical Interval Note: The patient is here today for scheduled right total hip arthroplasty to treat the pain from his right hip osteoarthritis.  There has been no interval change in his medical status.  See recent H&P.  The risks and benefits of surgery have been described in detail and informed consent is obtained.  The right hip has been marked.  07/07/2020 11:41 AM  Juan Hudson  has presented today for surgery, with the diagnosis of Osteoarthritis Right Hip.  The various methods of treatment have been discussed with the patient and family. After consideration of risks, benefits and other options for treatment, the patient has consented to  Procedure(s): RIGHT TOTAL HIP ARTHROPLASTY ANTERIOR APPROACH (Right) as a surgical intervention.  The patient's history has been reviewed, patient examined, no change in status, stable for surgery.  I have reviewed the patient's chart and labs.  Questions were answered to the patient's satisfaction.     Mcarthur Rossetti

## 2020-07-07 NOTE — Plan of Care (Signed)

## 2020-07-07 NOTE — Brief Op Note (Signed)
07/07/2020  2:09 PM  PATIENT:  [name]  74 y.o. male  PRE-OPERATIVE DIAGNOSIS:  Osteoarthritis Right Hip  POST-OPERATIVE DIAGNOSIS:  Osteoarthritis Right Hip  PROCEDURE:  Procedure(s): RIGHT TOTAL HIP ARTHROPLASTY ANTERIOR APPROACH (Right)  SURGEON:  Surgeon(s) and Role:    Mcarthur Rossetti, MD - Primary  PHYSICIAN ASSISTANT:  Benita Stabile, PA-C  ANESTHESIA:   spinal  EBL:  250 mL   COUNTS:  YES  DICTATION: .Other Dictation: Dictation Number [id number]  PLAN OF CARE: Admit for overnight observation  PATIENT DISPOSITION:  PACU - hemodynamically stable.   Delay start of Pharmacological VTE agent (>24hrs) due to surgical blood loss or risk of bleeding: no

## 2020-07-07 NOTE — Anesthesia Postprocedure Evaluation (Signed)
Anesthesia Post Note  Patient: Juan Hudson  Procedure(s) Performed: RIGHT TOTAL HIP ARTHROPLASTY ANTERIOR APPROACH (Right Hip)     Patient location during evaluation: PACU Anesthesia Type: Spinal Level of consciousness: oriented and awake and alert Pain management: pain level controlled Vital Signs Assessment: post-procedure vital signs reviewed and stable Respiratory status: spontaneous breathing, respiratory function stable and nonlabored ventilation Cardiovascular status: blood pressure returned to baseline and stable Postop Assessment: no headache, no backache, no apparent nausea or vomiting and spinal receding Anesthetic complications: no   No complications documented.  Last Vitals:  Vitals:   07/07/20 1551 07/07/20 1646  BP: (!) 114/58 130/70  Pulse: (!) 55 69  Resp: 15 18  Temp: 36.5 C 36.7 C  SpO2: 100% 100%    Last Pain:  Vitals:   07/07/20 1646  TempSrc: Oral  PainSc:                  [name]

## 2020-07-07 NOTE — Transfer of Care (Signed)
Immediate Anesthesia Transfer of Care Note  Patient: Juan Hudson  Procedure(s) Performed: RIGHT TOTAL HIP ARTHROPLASTY ANTERIOR APPROACH (Right Hip)  Patient Location: PACU  Anesthesia Type:MAC and Spinal  Level of Consciousness: awake, alert , oriented and patient cooperative  Airway & Oxygen Therapy: Patient Spontanous Breathing and Patient connected to face mask oxygen  Post-op Assessment: Report given to RN and Post -op Vital signs reviewed and stable  Post vital signs: Reviewed and stable  Last Vitals:  Vitals Value Taken Time  BP    Temp    Pulse    Resp    SpO2      Last Pain:  Vitals:   07/07/20 1103  TempSrc: Oral  PainSc:       Patients Stated Pain Goal: 4 (56/25/63 8937)  Complications: No complications documented.

## 2020-07-07 NOTE — Anesthesia Preprocedure Evaluation (Signed)
Anesthesia Evaluation  Patient identified by MRN, date of birth, ID band Patient awake    Reviewed: Allergy & Precautions, NPO status , Patient's Chart, lab work & pertinent test results  History of Anesthesia Complications Negative for: history of anesthetic complications  Airway Mallampati: II  TM Distance: >3 FB Neck ROM: Full    Dental  (+) Teeth Intact   Pulmonary sleep apnea and Continuous Positive Airway Pressure Ventilation , former smoker,    Pulmonary exam normal        Cardiovascular hypertension, Pt. on medications Normal cardiovascular exam+ Valvular Problems/Murmurs AS and AI      Neuro/Psych S/p lumbar fusion negative psych ROS   GI/Hepatic Neg liver ROS, GERD  ,  Endo/Other  negative endocrine ROS  Renal/GU negative Renal ROS  negative genitourinary   Musculoskeletal  (+) Arthritis , Osteoarthritis,    Abdominal   Peds  Hematology negative hematology ROS (+)   Anesthesia Other Findings  Echo 12/25/18: EF 60-65%, mild AI, mild AS (MG 9, AVA 1.33)  Reproductive/Obstetrics                            Anesthesia Physical Anesthesia Plan  ASA: III  Anesthesia Plan: Spinal   Post-op Pain Management:    Induction:   PONV Risk Score and Plan: 1 and Propofol infusion, Treatment may vary due to age or medical condition, Ondansetron and TIVA  Airway Management Planned: Nasal Cannula and Simple Face Mask  Additional Equipment: None  Intra-op Plan:   Post-operative Plan:   Informed Consent: I have reviewed the patients History and Physical, chart, labs and discussed the procedure including the risks, benefits and alternatives for the proposed anesthesia with the patient or authorized representative who has indicated his/her understanding and acceptance.       Plan Discussed with:   Anesthesia Plan Comments:         Anesthesia Quick Evaluation

## 2020-07-07 NOTE — Anesthesia Procedure Notes (Signed)
Spinal  Patient location during procedure: OR Staffing Performed: anesthesiologist  Anesthesiologist: ,  E, MD Preanesthetic Checklist Completed: patient identified, IV checked, risks and benefits discussed, surgical consent, monitors and equipment checked, pre-op evaluation and timeout performed Spinal Block Patient position: sitting Prep: DuraPrep and site prepped and draped Patient monitoring: continuous pulse ox, blood pressure and heart rate Approach: midline Location: L3-4 Injection technique: single-shot Needle Needle type: Pencan  Needle gauge: 24 G Needle length: 9 cm Additional Notes Functioning IV was confirmed and monitors were applied. Sterile prep and drape, including hand hygiene and sterile gloves were used. The patient was positioned and the spine was prepped. The skin was anesthetized with lidocaine.  Free flow of clear CSF was obtained prior to injecting local anesthetic into the CSF. The needle was carefully withdrawn. The patient tolerated the procedure well.      

## 2020-07-07 NOTE — Evaluation (Signed)
Physical Therapy Evaluation Patient Details Name: Juan Hudson MRN: [id number] DOB: [birth year] Today's Date: 07/07/2020   History of Present Illness  Patient is 74 y.o. male s/p Rt THA anterior approach on 07/07/20 with PMH significant for OA, HTN, HLD, GERD, L2-4 fusion.    Clinical Impression  KIMON LOEWEN is a 74 y.o. male POD 0 s/p Rt THA. Patient reports independence with use of RW for mobility at baseline. Patient is now limited by functional impairments (see PT problem list below) and requires min-mod assist for transfers and gait with RW. Patient was able to ambulate ~35 feet with RW and min-mod assist. Patient instructed in exercise to facilitate circulation. Patient will benefit from continued skilled PT interventions to address impairments and progress towards PLOF. Acute PT will follow to progress mobility and stair training in preparation for safe discharge home.     Follow Up Recommendations Follow surgeon's recommendation for DC plan and follow-up therapies;Home health PT    Equipment Recommendations  Rolling walker with 5" wheels    Recommendations for Other Services       Precautions / Restrictions Precautions Precautions: Fall Restrictions Weight Bearing Restrictions: No Other Position/Activity Restrictions: WBAT      Mobility  Bed Mobility Overal bed mobility: Needs Assistance Bed Mobility: Supine to Sit     Supine to sit: Min assist;HOB elevated     General bed mobility comments: cues to use bed rail and assist to bring Rt LE's off EOB.    Transfers Overall transfer level: Needs assistance Equipment used: Rolling walker (2 wheeled) Transfers: Sit to/from Stand Sit to Stand: Min assist;From elevated surface         General transfer comment: VC's for technique with RW. assist for power up from EOB and to steady once standing due to anterior lean.  Ambulation/Gait Ambulation/Gait assistance: Min assist;Mod assist Gait Distance (Feet): 35  Feet Assistive device: Rolling walker (2 wheeled) Gait Pattern/deviations: Step-to pattern;Decreased stride length;Narrow base of support Gait velocity: decr   General Gait Details: VC's for safe step pattern and proximity to RW. assist to steady throughout due hip extensor weakness resulting in atnerior lean and NBOS, suspect due to spinal block.  Stairs            Wheelchair Mobility    Modified Rankin (Stroke Patients Only)       Balance Overall balance assessment: Needs assistance Sitting-balance support: Feet supported;Bilateral upper extremity supported Sitting balance-Leahy Scale: Good     Standing balance support: During functional activity;Bilateral upper extremity supported Standing balance-Leahy Scale: Fair                               Pertinent Vitals/Pain Pain Assessment: 0-10 Pain Score: 0-No pain Pain Intervention(s): Monitored during session;Limited activity within patient's tolerance;Repositioned    Home Living Family/patient expects to be discharged to:: Private residence Living Arrangements: Alone Available Help at Discharge: Friend(s);Available PRN/intermittently Type of Home: House Home Access: Stairs to enter Entrance Stairs-Rails: Can reach both Entrance Stairs-Number of Steps: 3 Home Layout: Two level;Able to live on main level with bedroom/bathroom;Full bath on main level Home Equipment: None Additional Comments: pt plans to stay at friends house (no steps, 1 level house) plans to stay about 2 days.    Prior Function Level of Independence: Independent;Independent with assistive device(s)         Comments: pt has been using RW for ~2 months due to pain  Hand Dominance   Dominant Hand: Right    Extremity/Trunk Assessment   Upper Extremity Assessment Upper Extremity Assessment: Overall WFL for tasks assessed    Lower Extremity Assessment Lower Extremity Assessment: Overall WFL for tasks assessed (slight  weakness at hips due to spinal block)    Cervical / Trunk Assessment Cervical / Trunk Assessment: Normal  Communication   Communication: No difficulties  Cognition Arousal/Alertness: Awake/alert Behavior During Therapy: WFL for tasks assessed/performed Overall Cognitive Status: Within Functional Limits for tasks assessed                                        General Comments      Exercises Total Joint Exercises Ankle Circles/Pumps: AROM;Both;Seated;20 reps   Assessment/Plan    PT Assessment Patient needs continued PT services  PT Problem List Decreased strength;Decreased range of motion;Decreased activity tolerance;Decreased balance;Decreased mobility;Decreased knowledge of use of DME;Decreased knowledge of precautions       PT Treatment Interventions Therapeutic exercise;DME instruction;Gait training;Stair training;Functional mobility training;Therapeutic activities;Balance training;Patient/family education    PT Goals (Current goals can be found in the Care Plan section)  Acute Rehab PT Goals Patient Stated Goal: get back to walking with his friends PT Goal Formulation: With patient Time For Goal Achievement: 07/14/20 Potential to Achieve Goals: Good    Frequency 7X/week   Barriers to discharge        Co-evaluation               AM-PAC PT "6 Clicks" Mobility  Outcome Measure Help needed turning from your back to your side while in a flat bed without using bedrails?: A Little Help needed moving from lying on your back to sitting on the side of a flat bed without using bedrails?: A Little Help needed moving to and from a bed to a chair (including a wheelchair)?: A Little Help needed standing up from a chair using your arms (e.g., wheelchair or bedside chair)?: A Little Help needed to walk in hospital room?: A Lot Help needed climbing 3-5 steps with a railing? : A Lot 6 Click Score: 16    End of Session Equipment Utilized During Treatment:  Gait belt Activity Tolerance: Patient tolerated treatment well Patient left: in chair;with call bell/phone within reach;with chair alarm set Nurse Communication: Mobility status PT Visit Diagnosis: Muscle weakness (generalized) (M62.81);Difficulty in walking, not elsewhere classified (R26.2)    Time: 0712-1975 PT Time Calculation (min) (ACUTE ONLY): 32 min   Charges:   PT Evaluation $PT Eval Low Complexity: 1 Low PT Treatments $Gait Training: 8-22 mins        Verner Mould, DPT Acute Rehabilitation Services Office [phone] Pager [phone]    Jacques Navy 07/07/2020, 6:07 PM

## 2020-07-08 DIAGNOSIS — Z87891 Personal history of nicotine dependence: Secondary | ICD-10-CM | POA: Diagnosis not present

## 2020-07-08 DIAGNOSIS — Z79899 Other long term (current) drug therapy: Secondary | ICD-10-CM | POA: Diagnosis not present

## 2020-07-08 DIAGNOSIS — Z96641 Presence of right artificial hip joint: Secondary | ICD-10-CM

## 2020-07-08 DIAGNOSIS — Z7982 Long term (current) use of aspirin: Secondary | ICD-10-CM | POA: Diagnosis not present

## 2020-07-08 DIAGNOSIS — Z20822 Contact with and (suspected) exposure to covid-19: Secondary | ICD-10-CM | POA: Diagnosis present

## 2020-07-08 DIAGNOSIS — M160 Bilateral primary osteoarthritis of hip: Secondary | ICD-10-CM | POA: Diagnosis present

## 2020-07-08 DIAGNOSIS — Z885 Allergy status to narcotic agent status: Secondary | ICD-10-CM | POA: Diagnosis not present

## 2020-07-08 DIAGNOSIS — G4733 Obstructive sleep apnea (adult) (pediatric): Secondary | ICD-10-CM | POA: Diagnosis present

## 2020-07-08 DIAGNOSIS — M1611 Unilateral primary osteoarthritis, right hip: Secondary | ICD-10-CM | POA: Diagnosis present

## 2020-07-08 DIAGNOSIS — Z85828 Personal history of other malignant neoplasm of skin: Secondary | ICD-10-CM | POA: Diagnosis not present

## 2020-07-08 DIAGNOSIS — I10 Essential (primary) hypertension: Secondary | ICD-10-CM | POA: Diagnosis present

## 2020-07-08 DIAGNOSIS — M25451 Effusion, right hip: Secondary | ICD-10-CM | POA: Diagnosis present

## 2020-07-08 DIAGNOSIS — Z981 Arthrodesis status: Secondary | ICD-10-CM | POA: Diagnosis not present

## 2020-07-08 DIAGNOSIS — R42 Dizziness and giddiness: Secondary | ICD-10-CM | POA: Diagnosis not present

## 2020-07-08 DIAGNOSIS — Z79890 Hormone replacement therapy (postmenopausal): Secondary | ICD-10-CM | POA: Diagnosis not present

## 2020-07-08 DIAGNOSIS — K219 Gastro-esophageal reflux disease without esophagitis: Secondary | ICD-10-CM | POA: Diagnosis present

## 2020-07-08 DIAGNOSIS — E785 Hyperlipidemia, unspecified: Secondary | ICD-10-CM | POA: Diagnosis present

## 2020-07-08 DIAGNOSIS — Z8249 Family history of ischemic heart disease and other diseases of the circulatory system: Secondary | ICD-10-CM | POA: Diagnosis not present

## 2020-07-08 LAB — CBC
HCT: 34.1 % — ABNORMAL LOW (ref 39.0–52.0)
Hemoglobin: 11.3 g/dL — ABNORMAL LOW (ref 13.0–17.0)
MCH: 30.5 pg (ref 26.0–34.0)
MCHC: 33.1 g/dL (ref 30.0–36.0)
MCV: 92.2 fL (ref 80.0–100.0)
Platelets: 181 10*3/uL (ref 150–400)
RBC: 3.7 MIL/uL — ABNORMAL LOW (ref 4.22–5.81)
RDW: 15.4 % (ref 11.5–15.5)
WBC: 11.4 10*3/uL — ABNORMAL HIGH (ref 4.0–10.5)
nRBC: 0 % (ref 0.0–0.2)

## 2020-07-08 LAB — BASIC METABOLIC PANEL
Anion gap: 10 (ref 5–15)
BUN: 22 mg/dL (ref 8–23)
CO2: 26 mmol/L (ref 22–32)
Calcium: 8.4 mg/dL — ABNORMAL LOW (ref 8.9–10.3)
Chloride: 100 mmol/L (ref 98–111)
Creatinine, Ser: 1.04 mg/dL (ref 0.61–1.24)
GFR, Estimated: >60 mL/min (ref >60)
Glucose, Bld: 138 mg/dL — ABNORMAL HIGH (ref 70–99)
Potassium: 4.1 mmol/L (ref 3.5–5.1)
Sodium: 136 mmol/L (ref 135–145)

## 2020-07-08 LAB — GLUCOSE, CAPILLARY: Glucose-Capillary: 96 mg/dL (ref 70–99)

## 2020-07-08 MED ORDER — METHOCARBAMOL 500 MG PO TABS: 500.0000 mg | ORAL_TABLET | Freq: Four times a day (QID) | ORAL | 1 refills | Status: DC | PRN

## 2020-07-08 MED ORDER — ASPIRIN 81 MG PO CHEW: 81.0000 mg | CHEWABLE_TABLET | Freq: Two times a day (BID) | ORAL | 0 refills | Status: DC

## 2020-07-08 MED ORDER — OXYCODONE HCL 5 MG PO TABS: 5.0000 mg | ORAL_TABLET | ORAL | 0 refills | Status: DC | PRN

## 2020-07-08 NOTE — Progress Notes (Signed)
Physical Therapy Treatment Patient Details Name: Juan Hudson MRN: [id number] DOB: [birth year] Today's Date: 07/08/2020    History of Present Illness Patient is 74 y.o. male s/p Rt THA anterior approach on 07/07/20 with PMH significant for OA, HTN, HLD, GERD, L2-4 fusion.    PT Comments    Pt with vasovagal episode this am prior to PT arrival, no evidence of vagaling during PT session. Pt tolerated short distance ambulation, initiation of THA HEP, and repeated transfers well this session, but is requiring min assist for mobility at this time. This PT recommended overnight stay to Dr. Ninfa Linden, PT to see pt for second session this pm.     Follow Up Recommendations  Follow surgeon's recommendation for DC plan and follow-up therapies;Home health PT     Equipment Recommendations  Rolling walker with 5" wheels    Recommendations for Other Services       Precautions / Restrictions Precautions Precautions: Fall Precaution Comments: history of x2 vasovagal events Restrictions Other Position/Activity Restrictions: WBAT    Mobility  Bed Mobility Overal bed mobility: Needs Assistance Bed Mobility: Supine to Sit     Supine to sit: Min assist;HOB elevated     General bed mobility comments: Min assist for RLE progression to EOB, trunk elevation, and scooting to EOB. Increased time and effort, use of bedrails.  Transfers Overall transfer level: Needs assistance Equipment used: Rolling walker (2 wheeled) Transfers: Sit to/from Stand Sit to Stand: Min assist         General transfer comment: Min assist for power up, rise, and steady. Verbal cuing for hand placement when rising, STS x2 from EOB  Ambulation/Gait Ambulation/Gait assistance: Min assist Gait Distance (Feet): 20 Feet Assistive device: Rolling walker (2 wheeled) Gait Pattern/deviations: Step-through pattern;Decreased stride length;Trunk flexed;Antalgic Gait velocity: decr   General Gait Details: Min assist to  steady, maneuver RW. Verbal cuing for upright posture, placement in RW.   Stairs             Wheelchair Mobility    Modified Rankin (Stroke Patients Only)       Balance Overall balance assessment: Needs assistance Sitting-balance support: Feet supported;Bilateral upper extremity supported Sitting balance-Leahy Scale: Good     Standing balance support: During functional activity;Bilateral upper extremity supported Standing balance-Leahy Scale: Poor Standing balance comment: reliant on external support                            Cognition Arousal/Alertness: Awake/alert Behavior During Therapy: WFL for tasks assessed/performed Overall Cognitive Status: Within Functional Limits for tasks assessed                                        Exercises Total Joint Exercises Ankle Circles/Pumps: AROM;Both;10 reps;Seated Quad Sets: AROM;Right;10 reps;Supine Heel Slides: AAROM;Right;10 reps;Supine Hip ABduction/ADduction: AAROM;Right;10 reps;Supine Marching in Standing: AROM;Both;5 reps;Standing    General Comments        Pertinent Vitals/Pain Pain Assessment: Faces Faces Pain Scale: Hurts even more Pain Location: R hip Pain Descriptors / Indicators: Sore;Discomfort Pain Intervention(s): Monitored during session;Limited activity within patient's tolerance;Repositioned    Home Living                      Prior Function            PT Goals (current goals can now be found in the  care plan section) Acute Rehab PT Goals Patient Stated Goal: get back to walking with his friends PT Goal Formulation: With patient Time For Goal Achievement: 07/14/20 Potential to Achieve Goals: Good Progress towards PT goals: Progressing toward goals    Frequency    7X/week      PT Plan Current plan remains appropriate    Co-evaluation              AM-PAC PT "6 Clicks" Mobility   Outcome Measure  Help needed turning from your back to  your side while in a flat bed without using bedrails?: A Little Help needed moving from lying on your back to sitting on the side of a flat bed without using bedrails?: A Little Help needed moving to and from a bed to a chair (including a wheelchair)?: A Little Help needed standing up from a chair using your arms (e.g., wheelchair or bedside chair)?: A Little Help needed to walk in hospital room?: A Little Help needed climbing 3-5 steps with a railing? : A Lot 6 Click Score: 17    End of Session Equipment Utilized During Treatment: Gait belt Activity Tolerance: Patient tolerated treatment well Patient left: in chair;with call bell/phone within reach;with chair alarm set Nurse Communication: Mobility status PT Visit Diagnosis: Muscle weakness (generalized) (M62.81);Difficulty in walking, not elsewhere classified (R26.2)     Time: 4720-7218 PT Time Calculation (min) (ACUTE ONLY): 36 min  Charges:  $Gait Training: 8-22 mins $Therapeutic Exercise: 8-22 mins                    Stacie Glaze, PT Acute Rehabilitation Services Pager [phone]  Office [phone]  [place] 07/08/2020, 12:40 PM

## 2020-07-08 NOTE — TOC Initial Note (Signed)
Transition of Care [facility]) - Initial/Assessment Note    Patient Details  Name: Juan Hudson MRN: [id number] Date of Birth: Feb 24, 1946  [facility] of [facility]) CM/SW Contact:    Joaquin Courts, RN Phone Number: 07/08/2020, 3:29 PM  Clinical Narrative:                 CM spoke with patient who declines 3in1.  [employer] to deliver rolling walker to bedside.  [place] for HHPT.  Expected Discharge Plan: [place] Barriers to Discharge: Continued Medical Work up   Patient Goals and CMS Choice Patient states their goals for this hospitalization and ongoing recovery are:: to go home with therapy CMS Medicare.gov Compare Post Acute Care list provided to:: Patient Choice offered to / list presented to : Patient  Expected Discharge Plan and Services Expected Discharge Plan: [place]   Discharge Planning Services: CM Consult Post Acute Care Choice: [place] arrangements for the past 2 months: Single Family Home                 DME Arranged: Walker rolling DME Agency: [employer] Date DME Agency Contacted: 07/08/20 Time DME Agency Contacted: 1528 Representative spoke with at DME Agency: [facility] HH Arranged: PT [place]: [facility] [employer]     Representative spoke with at [place]: pre-arranged in MD office  Prior Living Arrangements/Services Living arrangements for the past 2 months: [place]   Patient language and need for interpreter reviewed:: Yes Do you feel safe going back to the place where you live?: Yes      Need for Family Participation in Patient Care: Yes (Comment) Care giver support system in place?: Yes (comment)   Criminal Activity/Legal Involvement Pertinent to Current Situation/Hospitalization: No - Comment as needed  Activities of Daily Living Home Assistive Devices/Equipment: Walker (specify type),CPAP,Eyeglasses,Raised toilet seat with rails,Blood pressure cuff ADL  Screening (condition at time of admission) Patient's cognitive ability adequate to safely complete daily activities?: Yes Is the patient deaf or have difficulty hearing?: No Does the patient have difficulty seeing, even when wearing glasses/contacts?: No Does the patient have difficulty concentrating, remembering, or making decisions?: No Patient able to express need for assistance with ADLs?: Yes Does the patient have difficulty dressing or bathing?: No Independently performs ADLs?: Yes (appropriate for developmental age) Does the patient have difficulty walking or climbing stairs?: Yes Weakness of Legs: Both Weakness of Arms/Hands: None  Permission Sought/Granted                  Emotional Assessment Appearance:: Appears stated age Attitude/Demeanor/Rapport: Engaged Affect (typically observed): Accepting Orientation: : Oriented to Self,Oriented to Place,Oriented to  Time,Oriented to Situation   Psych Involvement: No (comment)  Admission diagnosis:  Status post total replacement of left hip [Z96.642] Status post total replacement of right hip [Z96.641] Patient Active Problem List   Diagnosis Date Noted  . Status post total replacement of right hip 07/08/2020  . Status post total replacement of left hip 07/07/2020  . Unilateral primary osteoarthritis, left hip 05/15/2020  . Unilateral primary osteoarthritis, right hip 05/15/2020  . Lumbar stenosis with neurogenic claudication 01/11/2020  . Subacute bronchitis 02/23/2019  . Left lumbar radiculopathy 04/08/2017  . Heart murmur, systolic 69/48/5462  . Hypersomnia 10/12/2015  . Obstructive sleep apnea 02/15/2011  . Foot pain 01/16/2011  . Metatarsalgia of right foot 11/15/2010  . Loss of transverse plantar arch 11/15/2010   PCP:  Joylene Draft,  Elta [place], MD Pharmacy:   [id number] - [place], [place] [address] Phone: [phone] Fax: [phone]     Social  Determinants of Health (SDOH) Interventions    Readmission Risk Interventions No flowsheet data found.

## 2020-07-08 NOTE — Progress Notes (Signed)
Physical Therapy Treatment Patient Details Name: Juan Hudson MRN: [id number] DOB: April 04, 1946 Today's Date: 07/08/2020    History of Present Illness Patient is 74 y.o. male s/p Rt THA anterior approach on 07/07/20 with PMH significant for OA, HTN, HLD, GERD, L2-4 fusion.    PT Comments    Pt complaining of moderate to severe R hip pain during mobility, but ambulating nearly 100 ft this session with use of RW and increased time. Pt with clicking and popping sounds from back with nearly every step, pt states pops are painless. Pt progressing well, will need to proficiently navigate steps tomorrow to demonstrate ability to enter home.     Follow Up Recommendations  Follow surgeon's recommendation for DC plan and follow-up therapies;Home health PT     Equipment Recommendations  Rolling walker with 5" wheels    Recommendations for Other Services       Precautions / Restrictions Precautions Precautions: Fall Precaution Comments: history of x2 vasovagal events Restrictions Weight Bearing Restrictions: No Other Position/Activity Restrictions: WBAT    Mobility  Bed Mobility Overal bed mobility: Needs Assistance Bed Mobility: Supine to Sit     Supine to sit: HOB elevated;Mod assist     General bed mobility comments: mod assist for trunk elevation/lowering, LE lifting into bed. Very increased time and effort.  Transfers Overall transfer level: Needs assistance Equipment used: Rolling walker (2 wheeled) Transfers: Sit to/from Stand Sit to Stand: Min assist         General transfer comment: Min assist for power up, rise, and steady. Verbal cuing for hand placement when rising  Ambulation/Gait Ambulation/Gait assistance: Min assist   Assistive device: Rolling walker (2 wheeled) Gait Pattern/deviations: Step-through pattern;Decreased stride length;Trunk flexed;Antalgic Gait velocity: decr   General Gait Details: Min assist to steady, verbal cuing for upright posture,  placement in RW, and increasing foot clearance RLE. Ambulatory x20 ft (to and from bathroom), x70 ft in hallway.    Stairs             Wheelchair Mobility    Modified Rankin (Stroke Patients Only)       Balance Overall balance assessment: Needs assistance Sitting-balance support: Feet supported;Bilateral upper extremity supported Sitting balance-Leahy Scale: Good     Standing balance support: During functional activity;Bilateral upper extremity supported Standing balance-Leahy Scale: Poor Standing balance comment: reliant on external support                            Cognition Arousal/Alertness: Awake/alert Behavior During Therapy: WFL for tasks assessed/performed Overall Cognitive Status: Within Functional Limits for tasks assessed                                        Exercises      General Comments        Pertinent Vitals/Pain Pain Assessment: 0-10 Pain Score: 7  Pain Location: R hip Pain Descriptors / Indicators: Sore;Discomfort Pain Intervention(s): Limited activity within patient's tolerance;Monitored during session;Repositioned;Premedicated before session    Home Living                      Prior Function            PT Goals (current goals can now be found in the care plan section) Acute Rehab PT Goals Patient Stated Goal: get back to walking with his friends  PT Goal Formulation: With patient Time For Goal Achievement: 07/14/20 Potential to Achieve Goals: Good Progress towards PT goals: Progressing toward goals    Frequency    7X/week      PT Plan Current plan remains appropriate    Co-evaluation              AM-PAC PT "6 Clicks" Mobility   Outcome Measure  Help needed turning from your back to your side while in a flat bed without using bedrails?: A Little Help needed moving from lying on your back to sitting on the side of a flat bed without using bedrails?: A Little Help needed  moving to and from a bed to a chair (including a wheelchair)?: A Little Help needed standing up from a chair using your arms (e.g., wheelchair or bedside chair)?: A Little Help needed to walk in hospital room?: A Little Help needed climbing 3-5 steps with a railing? : A Lot 6 Click Score: 17    End of Session Equipment Utilized During Treatment: Gait belt Activity Tolerance: Patient tolerated treatment well Patient left: with call bell/phone within reach;in bed;with bed alarm set;Other (comment) (ice applied to R hip) Nurse Communication: Mobility status;Other (comment) (RLE swelling, SCDs left off because this PT unsure of wear schedule) PT Visit Diagnosis: Muscle weakness (generalized) (M62.81);Difficulty in walking, not elsewhere classified (R26.2)     Time: 1194-1740 PT Time Calculation (min) (ACUTE ONLY): 26 min  Charges:  $Gait Training: 8-22 mins $Therapeutic Activity: 8-22 mins                    Stacie Glaze, PT Acute Rehabilitation Services Pager [phone]  Office [phone]    [place] 07/08/2020, 4:59 PM

## 2020-07-08 NOTE — Progress Notes (Signed)
Subjective: 1 Day Post-Op Procedure(s) (LRB): RIGHT TOTAL HIP ARTHROPLASTY ANTERIOR APPROACH (Right) Patient reports pain as mild.  Did get light-headed this am.  H&H stable.  Objective: Vital signs in last 24 hours: Temp:  [97.5 F (36.4 C)-98.1 F (36.7 C)] 97.9 F (36.6 C) (12/18 1045) Pulse Rate:  [55-93] 89 (12/18 1045) Resp:  [12-19] 18 (12/18 1045) BP: (100-130)/(46-87) 115/87 (12/18 1045) SpO2:  [96 %-100 %] 100 % (12/18 1045)  Intake/Output from previous day: 12/17 0701 - 12/18 0700 In: 2707.7 [P.O.:238; I.V.:2169.7; IV Piggyback:300] Out: 2850 [Urine:2600; Blood:250] Intake/Output this shift: Total I/O In: 240 [P.O.:240] Out: 200 [Urine:200]  Recent Labs    07/08/20 0304  HGB 11.3*   Recent Labs    07/08/20 0304  WBC 11.4*  RBC 3.70*  HCT 34.1*  PLT 181   Recent Labs    07/08/20 0304  NA 136  K 4.1  CL 100  CO2 26  BUN 22  CREATININE 1.04  GLUCOSE 138*  CALCIUM 8.4*   No results for input(s): LABPT, INR in the last 72 hours.  Sensation intact distally Intact pulses distally Dorsiflexion/Plantar flexion intact Incision: dressing C/D/I   Assessment/Plan: 1 Day Post-Op Procedure(s) (LRB): RIGHT TOTAL HIP ARTHROPLASTY ANTERIOR APPROACH (Right) Up with therapy Plan for discharge tomorrow Discharge home with home health  Needs to stay today due to vasovagal event and to maximize therapy. Will change to Inpatient Admit.    [place] Rossetti 07/08/2020, 11:05 AM

## 2020-07-08 NOTE — Discharge Instructions (Signed)

## 2020-07-08 NOTE — Op Note (Signed)
NAME: DEREKE, NEUMANN MEDICAL RECORD [id number] ACCOUNT [id number] DATE OF BIRTH:1946/03/02 FACILITY: [facility] LOCATION: [facility] PHYSICIAN: Kerry Fort, MD  OPERATIVE REPORT  DATE OF PROCEDURE:  07/07/2020  PREOPERATIVE DIAGNOSIS:  Primary osteoarthritis and degenerative joint disease, right hip.  POSTOPERATIVE DIAGNOSIS:  Primary osteoarthritis and degenerative joint disease, right hip.  PROCEDURE:  Right total hip arthroplasty through direct anterior approach.  IMPLANTS:  DePuy Sector Gription acetabular component size 54, size 36+0 neutral polyethylene liner, size 12 Corail femoral component with standard offset, size 36-2 metal hip ball.  SURGEON:  Lind Guest. Ninfa Linden, MD  ASSISTANT:  Erskine Emery, PA-C.  ANESTHESIA:  Spinal.  ANTIBIOTICS:  Two grams IV Ancef.  ESTIMATED BLOOD LOSS:  250 mL.  COMPLICATIONS:  None.  INDICATIONS:  The patient is a 74 year old gentleman well known to me.  He has debilitating arthritis that is quite severe actually involving both his hips.  His right one hurts and the worst, so he wishes to proceed with a right total hip arthroplasty  today and we agree with this based on his clinical exam and plain film findings combined with the failure of conservative treatment.  His hip pain is daily and is detrimentally affecting his mobility, his quality of life and his activities of daily  living.  With surgery we did talk about the risk of acute blood loss anemia, nerve or vessel injury, fracture, infection, dislocation, DVT, implant failure and skin and soft tissue issues.  We talked about our goals being decreased pain, improve mobility  and overall improve quality of life.  DESCRIPTION OF PROCEDURE:  After informed consent was obtained and appropriate right hip was marked.  He was brought to the operating room and sat up on a stretcher where spinal anesthesia was then obtained.  He was laid in supine position on a  stretcher.  Foley  catheter was placed.  I assessed his leg lengths and he is a little shorter on the right than the left.  Traction boots were placed on both his feet.  Next, he was placed supine on the Hana fracture table, the perineal post in place and  both legs in line skeletal traction device and no traction applied.  His right operative hip was prepped and draped with DuraPrep and sterile drapes.  A time-out was called.  He was identified as correct patient, correct right hip.  I then made an  incision just inferior and posterior to the anterior iliac spine and carried this obliquely down the leg.  Dissected down tensor fascia lata muscle.  Tensor fascia was then divided longitudinally to proceed with direct anterior approach to the hip.   Identified and cauterized circumflex vessels and identified the hip capsule under the hip capsule in an L-type format finding a very large joint effusion, which is probably one of the more significant effusion that I have seen with no evidence of  infection that is very clear with very large.  Once we irrigated this all out, we placed Cobra retractors around the medial and lateral femoral neck and made our femoral neck cut with an oscillating saw just proximal to the lesser trochanter.  We  completed this with an osteotome and placed a corkscrew guide in the femoral head and removed the femoral head in its entirety and found a wide area devoid of cartilage and significant flattening.  I then placed a bent Hohmann over the medial acetabular  rim and removed remnants of the acetabular labrum and other debris, I then began reaming  under direct visualization from a size 43 reamer in stepwise increments up to a size 53 with all reamers under direct visualization, the last reamer under direct  fluoroscopy so I could obtain our depth in reaming, our inclination and anteversion.  I then placed the real DePuy Sector Gription acetabular component size 54 and a 36+0 neutral polyethylene liner for  that size acetabular component.  Attention was then  turned to the femur.  With the leg externally rotated to 120 degrees, extended and adducted, we replaced Mueller retractor medially and Hohman retractor behind the greater trochanter.  We released lateral joint capsule and used a box-cutting osteotome to  enter the femoral canal and a rongeur to lateralize, I then began broaching using the Corail broaching system from a size 8 up to a size 12.  With a size 12 in place, we tried a 36-2 metal hip ball, reduced this in the acetabulum.  We were pleased with  the leg length, offset, range of motion and stability assessed radiographically and mechanically.  I then dislocated the hip and removed the trial components.  We placed the real Corail femoral component size 12 with standard offset and the real 36-2  metal hip ball and again reduced this in the acetabulum and we liked the stability of this.  We assessed it mechanically and radiographically.  We then irrigated the soft tissue with normal saline solution using pulsatile lavage.  We reapproximated the  joint capsule with interrupted #1 Ethibond suture, followed by closing the tensor fascia with #1 Vicryl.  0 Vicryl was used to close the deep tissue and 2-0 Vicryl was used to close the subcutaneous tissue.  The skin was reapproximated with staples and  Aquacel dressing was applied.  He was taken off the Hana table and taken to recovery room in stable condition with all final counts being correct.  There were no complications noted.  Of note, Benita Stabile, PA-C, assisted during this case and assistance was  crucial for facilitating all aspects of the case.  HN/NUANCE  D:07/07/2020 T:07/08/2020 JOB:[id number]

## 2020-07-08 NOTE — Plan of Care (Signed)
  Problem: Education: Goal: Knowledge of General Education information will improve Description: Including pain rating scale, medication(s)/side effects and non-pharmacologic comfort measures Outcome: Progressing   Problem: Nutrition: Goal: Adequate nutrition will be maintained Outcome: Progressing   Problem: Coping: Goal: Level of anxiety will decrease Outcome: Progressing   

## 2020-07-09 NOTE — Progress Notes (Signed)
Physical Therapy Treatment Patient Details Name: Juan Hudson MRN: [id number] DOB: 1946-03-27 Today's Date: 07/09/2020    History of Present Illness Patient is 74 y.o. male s/p Rt THA anterior approach on 07/07/20 with PMH significant for OA, HTN, HLD, GERD, L2-4 fusion.    PT Comments    Pt in good spirits and with with no c/o dizziness this am.  Pt up to ambulate increased distance in hall and requiring decreased assistance for all tasks.  Pt very pleased with progress this am.   Follow Up Recommendations  Follow surgeons recommendation for DC plan and follow-up therapies;Home health PT     Equipment Recommendations  Rolling walker with 5" wheels    Recommendations for Other Services       Precautions / Restrictions Precautions Precautions: Fall Precaution Comments: history of x2 vasovagal events Restrictions Weight Bearing Restrictions: No Other Position/Activity Restrictions: WBAT    Mobility  Bed Mobility Overal bed mobility: Needs Assistance Bed Mobility: Supine to Sit     Supine to sit: Min assist;Mod assist     General bed mobility comments: cues for sequence with assist to manage R LE and to bring trunk to upright.  Transfers Overall transfer level: Needs assistance Equipment used: Rolling walker (2 wheeled) Transfers: Sit to/from Stand Sit to Stand: Min assist         General transfer comment: Min assist for power up, rise, and steady. Verbal cuing for hand placement when rising  Ambulation/Gait Ambulation/Gait assistance: Min assist;Min guard Gait Distance (Feet): 150 Feet Assistive device: Rolling walker (2 wheeled) Gait Pattern/deviations: Step-to pattern;Step-through pattern;Decreased step length - right;Decreased step length - left;Shuffle;Trunk flexed Gait velocity: decr   General Gait Details: Steady assist with cues for posture, position from RW and sequence   Stairs             Wheelchair Mobility    Modified Rankin  (Stroke Patients Only)       Balance Overall balance assessment: Needs assistance Sitting-balance support: Feet supported;No upper extremity supported Sitting balance-Leahy Scale: Good     Standing balance support: During functional activity;Bilateral upper extremity supported Standing balance-Leahy Scale: Poor                              Cognition Arousal/Alertness: Awake/alert Behavior During Therapy: WFL for tasks assessed/performed Overall Cognitive Status: Within Functional Limits for tasks assessed                                        Exercises Total Joint Exercises Ankle Circles/Pumps: AROM;Both;20 reps;Supine Quad Sets: AROM;Right;10 reps;Supine Heel Slides: AAROM;Right;Supine;20 reps Hip ABduction/ADduction: AAROM;Right;Supine;15 reps    General Comments        Pertinent Vitals/Pain Pain Assessment: 0-10 Pain Score: 5  Pain Location: R hip Pain Descriptors / Indicators: Sore;Discomfort Pain Intervention(s): Limited activity within patient's tolerance;Monitored during session;Premedicated before session;Ice applied    Home Living                      Prior Function            PT Goals (current goals can now be found in the care plan section) Acute Rehab PT Goals Patient Stated Goal: get back to walking with his friends PT Goal Formulation: With patient Time For Goal Achievement: 07/14/20 Potential to Achieve Goals: Good Progress towards  PT goals: Progressing toward goals    Frequency    7X/week      PT Plan Current plan remains appropriate    Co-evaluation              AM-PAC PT "6 Clicks" Mobility   Outcome Measure  Help needed turning from your back to your side while in a flat bed without using bedrails?: A Little Help needed moving from lying on your back to sitting on the side of a flat bed without using bedrails?: A Little Help needed moving to and from a bed to a chair (including a  wheelchair)?: A Little Help needed standing up from a chair using your arms (e.g., wheelchair or bedside chair)?: A Little Help needed to walk in hospital room?: A Little Help needed climbing 3-5 steps with a railing? : A Lot 6 Click Score: 17    End of Session Equipment Utilized During Treatment: Gait belt Activity Tolerance: Patient tolerated treatment well Patient left: in chair;with call bell/phone within reach;with chair alarm set Nurse Communication: Mobility status PT Visit Diagnosis: Muscle weakness (generalized) (M62.81);Difficulty in walking, not elsewhere classified (R26.2)     Time: 5041-3643 PT Time Calculation (min) (ACUTE ONLY): 46 min  Charges:  $Gait Training: 23-37 mins $Therapeutic Exercise: 8-22 mins                     Debe Coder PT Acute Rehabilitation Services Pager [phone] Office [phone]    , 07/09/2020, 1:11 PM

## 2020-07-09 NOTE — Progress Notes (Addendum)
   Subjective: 2 Days Post-Op Procedure(s) (LRB): RIGHT TOTAL HIP ARTHROPLASTY ANTERIOR APPROACH (Right) Patient reports pain as moderate.    Objective: Vital signs in last 24 hours: Temp:  [97.9 F (36.6 C)-99.4 F (37.4 C)] 97.9 F (36.6 C) (12/19 0517) Pulse Rate:  [85-101] 85 (12/19 0517) Resp:  [17-19] 17 (12/19 0517) BP: (112-131)/(61-87) 112/61 (12/19 0517) SpO2:  [93 %-100 %] 93 % (12/19 0517)  Intake/Output from previous day: 12/18 0701 - 12/19 0700 In: 1644.4 [P.O.:1080; I.V.:564.4] Out: 2975 [Urine:2975] Intake/Output this shift: Total I/O In: -  Out: 625 [Urine:625]  Recent Labs    07/08/20 0304  HGB 11.3*   Recent Labs    07/08/20 0304  WBC 11.4*  RBC 3.70*  HCT 34.1*  PLT 181   Recent Labs    07/08/20 0304  NA 136  K 4.1  CL 100  CO2 26  BUN 22  CREATININE 1.04  GLUCOSE 138*  CALCIUM 8.4*   No results for input(s): LABPT, INR in the last 72 hours.  Neurologically intact ,  Thigh swollen , eccymosis No results found.  Assessment/Plan: 2 Days Post-Op Procedure(s) (LRB): RIGHT TOTAL HIP ARTHROPLASTY ANTERIOR APPROACH (Right) Up with therapy, more post op thigh swelling than normal. Severe hip OA.  Using ice. May need SNF. Opposite hip also with severe OA slowing him down  Marybelle Killings 07/09/2020, 10:09 AM

## 2020-07-09 NOTE — Progress Notes (Signed)
Physical Therapy Treatment Patient Details Name: Juan Hudson MRN: [id number] DOB: [birth year] Today's Date: 07/09/2020    History of Present Illness Patient is 74 y.o. male s/p Rt THA anterior approach on 07/07/20 with PMH significant for OA, HTN, HLD, GERD, L2-4 fusion.    PT Comments    Pt requiring increased time and cues for all tasks but progressing steadily with mobility.  This pm, pt ambulated increased distance in hall, negotiated stairs, and reviewed bed mobility tasks.     Follow Up Recommendations  Follow surgeon's recommendation for DC plan and follow-up therapies;Home health PT     Equipment Recommendations  Rolling walker with 5" wheels    Recommendations for Other Services       Precautions / Restrictions Precautions Precautions: Fall Precaution Comments: history of x2 vasovagal events Restrictions Weight Bearing Restrictions: No Other Position/Activity Restrictions: WBAT    Mobility  Bed Mobility Overal bed mobility: Needs Assistance Bed Mobility: Sit to Supine     Supine to sit: Min assist;Mod assist Sit to supine: Min assist   General bed mobility comments: increased time with cues for sequence and assist to manage R LE into bed  Transfers Overall transfer level: Needs assistance Equipment used: Rolling walker (2 wheeled) Transfers: Sit to/from Stand Sit to Stand: Min assist         General transfer comment: Min assist for power up, rise, and steady. Verbal cuing for hand placement when rising  Ambulation/Gait Ambulation/Gait assistance: Min assist;Min guard Gait Distance (Feet): 400 Feet Assistive device: Rolling walker (2 wheeled) Gait Pattern/deviations: Step-to pattern;Step-through pattern;Decreased step length - right;Decreased step length - left;Shuffle;Trunk flexed Gait velocity: decr   General Gait Details: Steady assist with cues for posture, position from RW and initial sequence   Stairs Stairs: Yes Stairs assistance: Min  assist Stair Management: Two rails;Step to pattern;Forwards Number of Stairs: 5     Wheelchair Mobility    Modified Rankin (Stroke Patients Only)       Balance Overall balance assessment: Needs assistance Sitting-balance support: Feet supported;No upper extremity supported Sitting balance-Leahy Scale: Good     Standing balance support: During functional activity;Bilateral upper extremity supported Standing balance-Leahy Scale: Fair                              Cognition Arousal/Alertness: Awake/alert Behavior During Therapy: WFL for tasks assessed/performed Overall Cognitive Status: Within Functional Limits for tasks assessed                                        Exercises Total Joint Exercises Ankle Circles/Pumps: AROM;Both;20 reps;Supine Quad Sets: AROM;Right;10 reps;Supine Heel Slides: AAROM;Right;Supine;20 reps Hip ABduction/ADduction: AAROM;Right;Supine;15 reps    General Comments        Pertinent Vitals/Pain Pain Assessment: 0-10 Pain Score: 5  Pain Location: R hip Pain Descriptors / Indicators: Sore;Discomfort Pain Intervention(s): Limited activity within patient's tolerance;Monitored during session;Premedicated before session;Ice applied    Home Living                      Prior Function            PT Goals (current goals can now be found in the care plan section) Acute Rehab PT Goals Patient Stated Goal: get back to walking with his friends PT Goal Formulation: With patient Time For Goal Achievement: 07/14/20  Potential to Achieve Goals: Good Progress towards PT goals: Progressing toward goals    Frequency    7X/week      PT Plan Current plan remains appropriate    Co-evaluation              AM-PAC PT "6 Clicks" Mobility   Outcome Measure  Help needed turning from your back to your side while in a flat bed without using bedrails?: A Little Help needed moving from lying on your back to  sitting on the side of a flat bed without using bedrails?: A Little Help needed moving to and from a bed to a chair (including a wheelchair)?: A Little Help needed standing up from a chair using your arms (e.g., wheelchair or bedside chair)?: A Little Help needed to walk in hospital room?: A Little Help needed climbing 3-5 steps with a railing? : A Little 6 Click Score: 18    End of Session Equipment Utilized During Treatment: Gait belt Activity Tolerance: Patient tolerated treatment well Patient left: in bed;with call bell/phone within reach;with bed alarm set Nurse Communication: Mobility status PT Visit Diagnosis: Muscle weakness (generalized) (M62.81);Difficulty in walking, not elsewhere classified (R26.2)     Time: 5825-1898 PT Time Calculation (min) (ACUTE ONLY): 43 min  Charges:  $Gait Training: 23-37 mins $Therapeutic Exercise: 8-22 mins $Therapeutic Activity: 8-22 mins                     Debe Coder PT Acute Rehabilitation Services Pager [phone] Office [phone]    , 07/09/2020, 3:12 PM

## 2020-07-09 NOTE — Plan of Care (Signed)
  Problem: Health Behavior/Discharge Planning: Goal: Ability to manage health-related needs will improve Outcome: Progressing   Problem: Activity: Goal: Ability to tolerate increased activity will improve Outcome: Progressing

## 2020-07-10 ENCOUNTER — Encounter (HOSPITAL_COMMUNITY): Payer: Self-pay | Admitting: Orthopaedic Surgery

## 2020-07-10 LAB — CBC
HCT: 29.9 % — ABNORMAL LOW (ref 39.0–52.0)
Hemoglobin: 9.8 g/dL — ABNORMAL LOW (ref 13.0–17.0)
MCH: 30.3 pg (ref 26.0–34.0)
MCHC: 32.8 g/dL (ref 30.0–36.0)
MCV: 92.6 fL (ref 80.0–100.0)
Platelets: 158 10*3/uL (ref 150–400)
RBC: 3.23 MIL/uL — ABNORMAL LOW (ref 4.22–5.81)
RDW: 15.5 % (ref 11.5–15.5)
WBC: 7.7 10*3/uL (ref 4.0–10.5)
nRBC: 0 % (ref 0.0–0.2)

## 2020-07-10 LAB — SARS CORONAVIRUS 2 BY RT PCR (HOSPITAL ORDER, PERFORMED IN ~~LOC~~ HOSPITAL LAB): SARS Coronavirus 2: NEGATIVE

## 2020-07-10 MED ORDER — OXYCODONE HCL 5 MG PO TABS: 5.0000 mg | ORAL_TABLET | ORAL | 0 refills | Status: DC | PRN

## 2020-07-10 MED ORDER — ASPIRIN 81 MG PO CHEW: 81.0000 mg | CHEWABLE_TABLET | Freq: Two times a day (BID) | ORAL | 0 refills | Status: DC

## 2020-07-10 MED ORDER — METHOCARBAMOL 500 MG PO TABS: 500.0000 mg | ORAL_TABLET | Freq: Four times a day (QID) | ORAL | 1 refills | Status: DC | PRN

## 2020-07-10 NOTE — Discharge Summary (Signed)
Patient ID: Juan Hudson MRN: [id number] DOB/AGE: Jun 19, 1946 74 y.o.  Admit date: 07/07/2020 Discharge date: 07/10/2020  Admission Diagnoses:  Principal Problem:   Unilateral primary osteoarthritis, right hip Active Problems:   Status post total replacement of left hip   Status post total replacement of right hip   Discharge Diagnoses:  Same  Past Medical History:  Diagnosis Date  . Allergy   . Cancer ([place])    skin cancer  . Cataract    early  . GERD (gastroesophageal reflux disease)   . HLD (hyperlipidemia)   . Hypertension   . Rosacea   . Sleep apnea    cpap  . Systolic murmur     Surgeries: Procedure(s): RIGHT TOTAL HIP ARTHROPLASTY ANTERIOR APPROACH on 07/07/2020   Consultants:   Discharged Condition: Improved  Hospital Course: Juan Hudson is an 74 y.o. male who was admitted 07/07/2020 for operative treatment ofUnilateral primary osteoarthritis, right hip. Patient has severe unremitting pain that affects sleep, daily activities, and work/hobbies. After pre-op clearance the patient was taken to the operating room on 07/07/2020 and underwent  Procedure(s): RIGHT TOTAL HIP ARTHROPLASTY ANTERIOR APPROACH.    Patient was given perioperative antibiotics:  Anti-infectives (From admission, onward)   Start     Dose/Rate Route Frequency Ordered Stop   07/07/20 2000  ceFAZolin (ANCEF) IVPB 1 g/50 mL premix        1 g 100 mL/hr over 30 Minutes Intravenous Every 6 hours 07/07/20 1545 07/08/20 0334   07/07/20 1000  ceFAZolin (ANCEF) IVPB 2g/100 mL premix        2 g 200 mL/hr over 30 Minutes Intravenous On call to O.R. 07/07/20 0955 07/07/20 1332       Patient was given sequential compression devices, early ambulation, and chemoprophylaxis to prevent DVT.  Patient benefited maximally from hospital stay and there were no complications.    Recent vital signs:  Patient Vitals for the past 24 hrs:  BP Temp Temp src Pulse Resp SpO2  07/10/20 0601 117/68 98.6 F  (37 C) Oral 76 15 96 %  07/09/20 2004 131/70 99.1 F (37.3 C) Oral (!) 101 18 98 %  07/09/20 1500 (!) 91/51 97.7 F (36.5 C) Oral 96 18 93 %     Recent laboratory studies:  Recent Labs    07/08/20 0304 07/10/20 0816  WBC 11.4* 7.7  HGB 11.3* 9.8*  HCT 34.1* 29.9*  PLT 181 158  NA 136  --   K 4.1  --   CL 100  --   CO2 26  --   BUN 22  --   CREATININE 1.04  --   GLUCOSE 138*  --   CALCIUM 8.4*  --      Discharge Medications:   Allergies as of 07/10/2020      Reactions   Codeine Nausea Only      Medication List    STOP taking these medications   aspirin 81 MG tablet Replaced by: aspirin 81 MG chewable tablet     TAKE these medications   ascorbic acid 500 MG tablet Commonly known as: VITAMIN C Take 500 mg by mouth daily.   aspirin 81 MG chewable tablet Chew 1 tablet (81 mg total) by mouth 2 (two) times daily. Replaces: aspirin 81 MG tablet   B COMPLEX 100 PO Take 1 tablet by mouth daily.   Centrum tablet Take 1 tablet by mouth daily.   Fish Oil 1000 MG Caps Take 1,000 mg by mouth daily.  irbesartan-hydrochlorothiazide 300-12.5 MG tablet Commonly known as: AVALIDE Take 1 tablet by mouth at bedtime.   methocarbamol 500 MG tablet Commonly known as: ROBAXIN Take 1 tablet (500 mg total) by mouth every 6 (six) hours as needed for muscle spasms.   oxyCODONE 5 MG immediate release tablet Commonly known as: Oxy IR/ROXICODONE Take 1 tablet (5 mg total) by mouth every 4 (four) hours as needed for moderate pain (pain score 4-6).   pravastatin 40 MG tablet Commonly known as: PRAVACHOL Take 40 mg by mouth daily.   testosterone cypionate 200 MG/ML injection Commonly known as: DEPOTESTOSTERONE CYPIONATE Inject 100 mg into the muscle every Saturday.   Vitamin D 125 MCG (5000 UT) Caps Take 5,000 Units by mouth daily.   Zinc 25 MG Tabs Take 25 mg by mouth daily.            Durable Medical Equipment  (From admission, onward)         Start      Ordered   07/07/20 1546  DME 3 n 1  Once        07/07/20 1545   07/07/20 1546  DME Walker rolling  Once       Question Answer Comment  Walker: With 5 Inch Wheels   Patient needs a walker to treat with the following condition Status post total replacement of right hip      07/07/20 1545          Diagnostic Studies: DG Pelvis Portable  Result Date: 07/07/2020 CLINICAL DATA:  Right hip arthroplasty. EXAM: PORTABLE PELVIS 1-2 VIEWS COMPARISON:  Pelvis radiograph 05/03/2020 FINDINGS: Right hip arthroplasty in expected alignment. No periprosthetic lucency or fracture. Femoral stem is midline. Pubic rami are intact. Joint space narrowing and subchondral cystic changes in the left femoral head with slight flattening, unchanged from recent imaging. Recent postsurgical change includes air and edema in the soft tissues with lateral skin staples. IMPRESSION: Right hip arthroplasty without immediate postoperative complication. Electronically Signed   By: Keith Rake M.D.   On: 07/07/2020 16:46   DG C-Arm 1-60 Min-No Report  Result Date: 07/07/2020 Fluoroscopy was utilized by the requesting physician.  No radiographic interpretation.   CT VENOGRAM ABD/PEL  Result Date: 06/20/2020 CLINICAL DATA:  History of varicose vein surgery with persistent right lower leg swelling for the past 3 months. Evaluate for central venous occlusion. EXAM: CT ABDOMEN AND PELVIS WITH CONTRAST TECHNIQUE: Multidetector CT imaging of the abdomen and pelvis was performed using the standard protocol following bolus administration of intravenous contrast. CONTRAST:  156mL ISOVUE-370 IOPAMIDOL (ISOVUE-370) INJECTION 76% COMPARISON:  CT abdomen pelvis-05/05/2019; lumbar spine CT-01/05/2020; lumbar spine radiographs-01/11/2020 FINDINGS: Lower chest: Limited visualization of the lower thorax is negative for focal airspace opacity or pleural effusion. Normal heart size. Coronary artery calcifications. No pericardial effusion.  Hepatobiliary: Normal hepatic contour. No discrete hepatic lesions. Normal appearance of the gallbladder given degree distention. No radiopaque gallstones. No intra or extrahepatic biliary ductal dilatation. No ascites. Pancreas: Normal appearance of the pancreas. Spleen: Note is again made of an approximately 1.2 cm flash filling hemangioma involving the anterior tip of the spleen (image 25, series 2, similar to abdominal CT performed 04/2019. No perisplenic stranding. Adrenals/Urinary Tract: There is symmetric enhancement and excretion of the bilateral kidneys. No renal stones on this postcontrast examination. No discrete renal lesions. No urinary obstruction or perinephric stranding. There is mild thickening of the crux of the bilateral adrenal glands without discrete nodule. The urinary bladder is underdistended. Stomach/Bowel: Rather  extensive colonic diverticulosis, primarily involving the descending and sigmoid colon, without evidence of superimposed acute diverticulitis. Moderate colonic stool burden without evidence of enteric obstruction. Normal appearance of the terminal ileum and the appendix. No discrete areas of bowel wall thickening. Small hiatal hernia. No pneumoperitoneum, pneumatosis or portal venous gas. Vascular/Lymphatic: Moderate amount of mixed calcified and noncalcified atherosclerotic plaque throughout a normal caliber abdominal aorta, not definitely resulting in hemodynamically significant narrowing on this non CTA examination. The major branch vessels of the abdominal aorta appear patent on this non CTA examination. Mild ectasia of the bilateral common iliac arteries the right measuring approximately 2.2 cm, the left measuring approximately 2.0 cm (image 58, series 2). The dominant large right anterior periarticular fluid collection results in mass effect upon the right common femoral vein however the pelvic venous system and IVC appear widely patent. No bulky retroperitoneal, mesenteric,  pelvic or inguinal lymphadenopathy. Reproductive: Borderline enlarged heterogeneously enhancing prostate with mass effect on the undersurface of the urinary bladder. The prostate measures approximately 6.1 x 4.6 x 5.1 cm (axial image 84, series 2; sagittal image 99, series 8). Other: Subcutaneous edema about the midline of the low back. Musculoskeletal: Severe degenerative change of the bilateral hips with complete joint space loss bone-on-bone articulation subchondral sclerosis and cyst formation (representative coronal image 77, series 6), progressed compared to abdominal CT performed 05/04/2020. These findings are associated with development of complex adjacent fluid collections about the bilateral hips which likely communicate with the bilateral hip joint spaces with dominant collection anterior to the right hip joint space measuring at least 9.7 x 4.3 x 4.6 cm (axial image 83, series 2; coronal image 57, series 6), and dominant collection anterior to the left hip joint space measuring approximately 6.9 x 3.1 x 4.1 cm (coronal image 48, series 6; axial image 80, series 2). Note, while incompletely imaged the pelvic extension of the right-sided periarticular fluid collection was not seen on lumbar spine CT performed 12/2019. Post L2-L4 paraspinal fusion intervertebral disc space replacement without evidence of hardware failure or loosening. Stigmata of dish within the lower thoracic spine. Old/healed moderate (approximately 30%) compression deformity involving the superior endplate of L1 without associated fracture line or paraspinal hematoma, similar to lumbar spine CT performed 01/05/2020 IMPRESSION: 1. Findings worrisome for septic arthritis involving the bilateral hips with marked progression of bilateral hip degenerative change and development of large bilateral complex fluid collections which likely communicate with the bilateral hip joint spaces. While incompletely imaged, the pelvic extension of the  right-sided periarticular fluid collection was not seen lumbar spine CT performed 12/2019. Emergent orthopedic consultation is advised. 2. Dominant right-sided at least 9.7 cm periarticular fluid collection results in mass effect upon the right common femoral vein however the pelvic venous systems and IVC appear widely patent. 3. Post L2-L4 paraspinal fusion and intervertebral disc space replacement without evidence of hardware failure or loosening. 4. Moderate amount of atherosclerotic plaque within a normal caliber abdominal aorta. Aortic Atherosclerosis (ICD10-I70.0). 5. Incidentally noted fusiform ectasia of the bilateral common iliac arteries, the right measuring 2.2 cm, the left measuring 2.0 cm. 6. Coronary calcifications. Critical Value/emergent results were called by telephone at the time of interpretation on 06/20/2020 at 10:40 am to provider [facility] , who verbally acknowledged these results. Electronically Signed   By: Sandi Mariscal M.D.   On: 06/20/2020 10:53   DG HIP OPERATIVE UNILAT W OR W/O PELVIS RIGHT  Result Date: 07/07/2020 CLINICAL DATA:  Right hip replacement EXAM: OPERATIVE RIGHT HIP  WITH PELVIS COMPARISON:  None. FLUOROSCOPY TIME:  Radiation Exposure Index (as provided by the fluoroscopic device): 2.09 mGy If the device does not provide the exposure index: Fluoroscopy Time:  13 seconds Number of Acquired Images:  3 FINDINGS: Right hip replacement is noted in satisfactory position. No acute soft tissue abnormality is noted. IMPRESSION: Right hip replacement Electronically Signed   By: Inez Catalina M.D.   On: 07/07/2020 15:07    Disposition: Discharge disposition: 01-Home or [facility] [place]    Mcarthur Rossetti, MD Follow up in 2 week(s).   Specialty: Orthopedic Surgery Contact information: [place] [address] [phone]        Home, [facility] Follow up.   Specialty: [place] Why: agency will  provide home health therapy Contact information: [place] [address] [phone]                Signed: Mcarthur Rossetti 07/10/2020, 9:58 AM

## 2020-07-10 NOTE — NC FL2 (Addendum)
[place] LEVEL OF CARE SCREENING TOOL     IDENTIFICATION  Patient Name: Juan Hudson Birthdate: 1945/09/18 Sex: male Admission Date (Current Location): 07/07/2020  [facility] and [place] Number:  Herbalist and Address:  [facility],  [place], [place]      Provider Number: [id number]  Attending Physician Name and Address:  Mcarthur Rossetti,*  Relative Name and Phone Number:    Sudie Bailey  [phone]      Current Level of Care: Hospital  Recommended Level of Care: [place] Prior Approval Number:    Date Approved/Denied:   PASRR Number:   [id number]  Discharge Plan:   [place]     Current Diagnoses: Patient Active Problem List   Diagnosis Date Noted  . Status post total replacement of right hip 07/08/2020  . Status post total replacement of left hip 07/07/2020  . Unilateral primary osteoarthritis, left hip 05/15/2020  . Unilateral primary osteoarthritis, right hip 05/15/2020  . Lumbar stenosis with neurogenic claudication 01/11/2020  . Subacute bronchitis 02/23/2019  . Left lumbar radiculopathy 04/08/2017  . Heart murmur, systolic 65/09/5463  . Hypersomnia 10/12/2015  . Obstructive sleep apnea 02/15/2011  . Foot pain 01/16/2011  . Metatarsalgia of right foot 11/15/2010  . Loss of transverse plantar arch 11/15/2010    Orientation RESPIRATION BLADDER Height & Weight     Self,Time,Situation,Place  Normal Continent Weight: 192 lb (87.1 kg) Height:  5\' 10"  (177.8 cm)  BEHAVIORAL SYMPTOMS/MOOD NEUROLOGICAL BOWEL NUTRITION STATUS      Continent  (Regular)  AMBULATORY STATUS COMMUNICATION OF NEEDS Skin   Extensive Assist Verbally  (Right Hip)                       Personal Care Assistance Level of Assistance  Bathing,Feeding,Dressing Bathing Assistance: Maximum assistance Feeding assistance: Independent Dressing Assistance: Maximum assistance      Functional Limitations Info  Sight,Hearing,Speech Sight Info: Impaired (Wears Glasses) Hearing Info: Adequate Speech Info: Adequate    SPECIAL CARE FACTORS FREQUENCY  PT (By licensed PT),OT (By licensed OT)     PT Frequency: 5x/week OT Frequency: 5x/week            Contractures Contractures Info: Not present    Additional Factors Info  Code Status,Allergies,Psychotropic,Insulin Sliding Scale Code Status Info: Fullcode Allergies Info: Allergies: Codeine           Current Medications (07/10/2020):  This is the current hospital active medication list Current Facility-Administered Medications  Medication Dose Route Frequency Provider Last Rate Last Admin  . 0.9 %  sodium chloride infusion   Intravenous Continuous Mcarthur Rossetti, MD 75 mL/hr at 07/08/20 1118 Infusion Verify at 07/08/20 1118  . acetaminophen (TYLENOL) tablet 325-650 mg  325-650 mg Oral Q6H PRN Mcarthur Rossetti, MD   650 mg at 07/08/20 1607  . alum & mag hydroxide-simeth (MAALOX/MYLANTA) 200-200-20 MG/5ML suspension 30 mL  30 mL Oral Q4H PRN Mcarthur Rossetti, MD      . ascorbic acid (VITAMIN C) tablet 500 mg  500 mg Oral Daily Mcarthur Rossetti, MD   500 mg at 07/10/20 6812  . aspirin chewable tablet 81 mg  81 mg Oral BID Mcarthur Rossetti, MD   81 mg at 07/10/20 7517  . cholecalciferol (VITAMIN D3) tablet 5,000 Units  5,000 Units Oral Daily Mcarthur Rossetti, MD   5,000 Units at 07/10/20 [phone]  . diphenhydrAMINE (BENADRYL) 12.5  MG/5ML elixir 12.5-25 mg  12.5-25 mg Oral Q4H PRN Mcarthur Rossetti, MD      . docusate sodium (COLACE) capsule 100 mg  100 mg Oral BID Mcarthur Rossetti, MD   100 mg at 07/10/20 7322  . gabapentin (NEURONTIN) capsule 100 mg  100 mg Oral TID Mcarthur Rossetti, MD   100 mg at 07/10/20 0254  . irbesartan (AVAPRO) tablet 300 mg  300 mg Oral QHS Mcarthur Rossetti, MD   300 mg at 07/09/20 2027   And  . hydrochlorothiazide  (MICROZIDE) capsule 12.5 mg  12.5 mg Oral QHS Mcarthur Rossetti, MD   12.5 mg at 07/09/20 2029  . HYDROmorphone (DILAUDID) injection 0.5-1 mg  0.5-1 mg Intravenous Q4H PRN Mcarthur Rossetti, MD      . menthol-cetylpyridinium (CEPACOL) lozenge 3 mg  1 lozenge Oral PRN Mcarthur Rossetti, MD       Or  . phenol (CHLORASEPTIC) mouth spray 1 spray  1 spray Mouth/Throat PRN Mcarthur Rossetti, MD      . methocarbamol (ROBAXIN) tablet 500 mg  500 mg Oral Q6H PRN Mcarthur Rossetti, MD   500 mg at 07/10/20 2706   Or  . methocarbamol (ROBAXIN) 500 mg in dextrose 5 % 50 mL IVPB  500 mg Intravenous Q6H PRN Mcarthur Rossetti, MD      . metoCLOPramide (REGLAN) tablet 5-10 mg  5-10 mg Oral Q8H PRN Mcarthur Rossetti, MD       Or  . metoCLOPramide (REGLAN) injection 5-10 mg  5-10 mg Intravenous Q8H PRN Mcarthur Rossetti, MD      . ondansetron [facility]) tablet 4 mg  4 mg Oral Q6H PRN Mcarthur Rossetti, MD       Or  . ondansetron [facility]) injection 4 mg  4 mg Intravenous Q6H PRN Mcarthur Rossetti, MD      . oxyCODONE (Oxy IR/ROXICODONE) immediate release tablet 10-15 mg  10-15 mg Oral Q4H PRN Mcarthur Rossetti, MD   10 mg at 07/09/20 2357  . oxyCODONE (Oxy IR/ROXICODONE) immediate release tablet 5-10 mg  5-10 mg Oral Q4H PRN Mcarthur Rossetti, MD   10 mg at 07/10/20 2376  . pantoprazole (PROTONIX) EC tablet 40 mg  40 mg Oral Daily Mcarthur Rossetti, MD   40 mg at 07/10/20 2831  . pravastatin (PRAVACHOL) tablet 40 mg  40 mg Oral Daily Mcarthur Rossetti, MD   40 mg at 07/10/20 5176  . zinc sulfate capsule 220 mg  220 mg Oral Daily Mcarthur Rossetti, MD   220 mg at 07/10/20 1607     Discharge Medications: Please see discharge summary for a list of discharge medications.  Relevant Imaging Results:  Relevant Lab Results:   Additional Information PXT:[id number]  Lia Hopping, LCSW

## 2020-07-10 NOTE — Progress Notes (Signed)
Patient ID: Juan Hudson, male   DOB: June 12, 1946, 74 y.o.   MRN: [id number] Has decided that he needs short-term skilled nursing and again.  He thought he was going to be going home with someone but apparently does not have the assistance he needs.  He is interested in getting short-term skilled nursing.

## 2020-07-10 NOTE — Progress Notes (Signed)
Patient ID: Juan Hudson, male   DOB: [birth year], 74 y.o.   MRN: [id number] Apparently the patient is mobilizing better today and expresses a desire to go home instead of skilled nursing.  We can discharge him to home this afternoon.

## 2020-07-10 NOTE — Progress Notes (Signed)
Physical Therapy Treatment Patient Details Name: Juan Hudson MRN: [id number] DOB: June 03, 1946 Today's Date: 07/10/2020    History of Present Illness Patient is 74 y.o. male s/p Rt THA anterior approach on 07/07/20 with PMH significant for OA, HTN, HLD, GERD, L2-4 fusion.    PT Comments    POD # 3 am session Friend present during session.  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  NOT 100% sharpe Assisted with amb.  General Gait Details: decreased amb distance due to increased c/o fatigue.  25% VC's on proper walker to self dustance and 50% VC's on safety with turns.  Increased balance deficit with turns and backward gait.  Slightly slow/delayed corrective reaction.  Slightly impaired self awareness of current mobility exhibiting excessive lean on walker and increased unsteadiness with turns. General stair comments: pt required 50% repeat VC's to safely perform navigating one step/curb forward with walker.  Unstaedy with notable crepitis "other" hip.  Unsteady. Then returned to room to perform some TE's following HEP handout.  Instructed on proper tech, freq as well as use of ICE.   Pt lives home alone and will need ST Rehab at SNF prior to safely retuning.     Follow Up Recommendations  SNF     Equipment Recommendations       Recommendations for Other Services       Precautions / Restrictions Precautions Precautions: Fall Restrictions Weight Bearing Restrictions: No Other Position/Activity Restrictions: WBAT    Mobility  Bed Mobility               General bed mobility comments: OOB in recliner  Transfers Overall transfer level: Needs assistance Equipment used: Rolling walker (2 wheeled) Transfers: Sit to/from [facility] Sit to Stand: Min guard Stand pivot  transfers: Min guard;Min assist       General transfer comment: required 25% VC's on proper hand placement and 50% VC's safety with turns esp to keep walker at proper distance with turns and caution to balance.   Pt's "other" hip is "bad" and will need a THR.  Ambulation/Gait Ambulation/Gait assistance: Min guard Gait Distance (Feet): 38 Feet Assistive device: Rolling walker (2 wheeled) Gait Pattern/deviations: Step-to pattern;Step-through pattern;Decreased step length - right;Decreased step length - left;Shuffle;Trunk flexed Gait velocity: decr   General Gait Details: decreased amb distance due to increased c/o fatigue.  25% VC's on proper walker to self dustance and 50% VC's on safety with turns.  Increased balance deficit with turns and backward gait.  Slightly slow/delayed corrective reaction.  Slightly impaired self awareness of current mobility exhibiting excessive lean on walker and increased unsteadiness with turns.   Stairs Stairs: Yes Stairs assistance: Min assist Stair Management: No rails;Step to pattern;Forwards Number of Stairs: 1 General stair comments: pt required 50% repeat VC's to safely perform navigating one step/curb forward with walker.  Unstaedy with notable crepitis "other" hip.  Unsteady.   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  NOT 100% sharpe      Exercises   Total Hip Replacement TE's following HEP  Handout 10 reps ankle pumps 05 reps knee presses 05 reps heel slides 05 reps SAQ's 05 reps ABD 05 reps LAQ's 05 reps all standing TE's Instructed how to use a belt loop to assist  Followed by ICE     General Comments        Pertinent Vitals/Pain      Home Living                      Prior Function            PT Goals (current goals can now be found in the care plan section) Progress towards PT goals: Progressing toward goals    Frequency    7X/week      PT Plan Current plan remains appropriate    Co-evaluation              AM-PAC PT "6 Clicks" Mobility   Outcome Measure  Help needed turning from your back to your side while in a flat bed without using bedrails?: A Little Help needed moving from lying on your back to sitting on the side of a flat bed without using bedrails?: A Little Help needed moving to and from a bed to a chair (including a wheelchair)?: A Little Help needed standing up from a chair using your arms (e.g., wheelchair or bedside chair)?: A Little Help needed to walk in hospital room?: A Little Help needed climbing 3-5 steps with a railing? : A Lot 6 Click Score: 17    End of Session Equipment Utilized During Treatment: Gait belt Activity Tolerance: Patient limited by fatigue;Other (comment) (groggy) Patient left: in chair;with call bell/phone within reach;with family/visitor present Nurse Communication: Mobility status PT Visit Diagnosis: Muscle weakness (generalized) (M62.81);Difficulty in walking, not elsewhere classified (R26.2)     Time: 1005-1050 PT Time Calculation (min) (ACUTE ONLY): 45 min  Charges:  $Gait Training: 8-22 mins $Therapeutic Exercise: 8-22 mins $Therapeutic Activity: 8-22 mins                     Rica Koyanagi  PTA Acute  Rehabilitation Services Pager      [phone] Office      [phone]

## 2020-07-10 NOTE — Progress Notes (Signed)
Patient ID: Juan Hudson, male   DOB: 03-13-1946, 74 y.o.   MRN: [id number] The patient has struggled with his mobility.  He lives alone and was planning to go stay with a friend.  He is having enough issues with needing assistance with his mobility that it is more prudent to consider short-term skilled nursing.  He agrees with this assessment.  We will consult the transitional care team for short-term skilled nursing placement.  He understands I will need to order a COVID-19 test as well.

## 2020-07-10 NOTE — Plan of Care (Signed)
  Problem: Education: Goal: Knowledge of General Education information will improve Description: Including pain rating scale, medication(s)/side effects and non-pharmacologic comfort measures Outcome: Progressing   Problem: Health Behavior/Discharge Planning: Goal: Ability to manage health-related needs will improve Outcome: Progressing   Problem: Activity: Goal: Risk for activity intolerance will decrease Outcome: Progressing   

## 2020-07-10 NOTE — TOC Progression Note (Addendum)
Transition of Care [facility]) - Progression Note    Patient Details  Name: Juan Hudson MRN: [id number] Date of Birth: 06/18/1946  Transition of Care [facility]) CM/SW [place], LCSW Phone Number: 07/10/2020, 2:58 PM  Clinical Narrative:   Re: SNF  CSW discussed SNF placement process. CSW informed the patient about the required HTA authorization process. Patient report understanding. CSW will follow up with bed offers. HTA authorization initiated.    CSW provided the patient with a list of SNF bed offers.    Expected Discharge Plan: Skilled Nursing Facility Barriers to Discharge: Insurance Authorization,SNF Pending bed offer  Expected Discharge Plan and Services Expected Discharge Plan: [place]   Discharge Planning Services: CM Consult Post Acute Care Choice: [place] arrangements for the past 2 months: Single Family Home Expected Discharge Date: 07/10/20               DME Arranged: Gilford Rile rolling DME Agency: [employer] Date DME Agency Contacted: 07/08/20 Time DME Agency Contacted: [phone] Representative spoke with at DME Agency: [facility] Arranged: PT [place]: [facility] [employer]     Representative spoke with at [place]: pre-arranged in MD office   Social Determinants of Health ([place]) Interventions    Readmission Risk Interventions No flowsheet data found.

## 2020-07-10 NOTE — TOC Transition Note (Signed)
Transition of Care [facility]) - CM/SW Discharge Note   Patient Details  Name: Juan Hudson MRN: [id number] Date of Birth: 1946/02/17  Transition of Care [facility]) CM/SW Contact:  Lia Hopping, [place] Phone Number: 07/10/2020, 10:06 AM   Clinical Narrative:    Re: Consult for SNF  Patient declines SNF placement. Per nurse, he feels more prepared to transition home and has arranged help. CSW notified home health agency [facility]. Patient has DME.  No other needs identified.    Final next level of care: [place] Barriers to Discharge: Barriers Resolved   Patient Goals and CMS Choice Patient states their goals for this hospitalization and ongoing recovery are:: to go home with therapy CMS Medicare.gov Compare Post Acute Care list provided to:: Patient Choice offered to / list presented to : Patient  Discharge Placement                       Discharge Plan and Services   Discharge Planning Services: CM Consult Post Acute Care Choice: Home Health          DME Arranged: [place] rolling DME Agency: [employer] Date DME Agency Contacted: 07/08/20 Time DME Agency Contacted: 1528 Representative spoke with at DME Agency: [facility] Arranged: PT [place]: [facility] [employer]     Representative spoke with at [place]: pre-arranged in MD office  Social Determinants of Health (SDOH) Interventions     Readmission Risk Interventions No flowsheet data found.

## 2020-07-11 NOTE — Care Management Important Message (Signed)
Important Message  Patient Details IM Letter given to the Patient. Name: Juan Hudson MRN: [id number] Date of Birth: 09-Sep-1945   Medicare Important Message Given:  Yes     Kerin Salen 07/11/2020, 11:45 AM

## 2020-07-11 NOTE — Plan of Care (Signed)
  Problem: Nutrition: Goal: Adequate nutrition will be maintained Outcome: Progressing   Problem: Pain Managment: Goal: General experience of comfort will improve Outcome: Progressing   

## 2020-07-11 NOTE — TOC Progression Note (Signed)
Transition of Care [facility]) - Progression Note    Patient Details  Name: Juan Hudson MRN: [id number] Date of Birth: August 10, 1945  Transition of Care [facility]) CM/SW [place], [place] Phone Number: 07/11/2020, 12:02 PM  Clinical Narrative:    Patient choice [place] at [facility] CSW reached out to admission coordinator [place] Hun, she confirmed facility ready to accept today pending insurance approval.  HTA auth. Still pending at this time.    Expected Discharge Plan: Skilled Nursing Facility Barriers to Discharge: Insurance Authorization  Expected Discharge Plan and Services Expected Discharge Plan: [place]   Discharge Planning Services: CM Consult Post Acute Care Choice: [place] arrangements for the past 2 months: Single Family Home Expected Discharge Date: 07/11/20               DME Arranged: Gilford Rile rolling DME Agency: [employer] Date DME Agency Contacted: 07/08/20 Time DME Agency Contacted: [phone] Representative spoke with at DME Agency: [facility] HH Arranged: PT [place]: [facility] [employer]     Representative spoke with at [place]: pre-arranged in MD office   Social Determinants of Health ([place]) Interventions    Readmission Risk Interventions No flowsheet data found.

## 2020-07-11 NOTE — Progress Notes (Signed)
Patient ID: Juan Hudson, male   DOB: [birth year], 74 y.o.   MRN: [id number] Awake and alert.  No acute changes.  Right hip stable.  Vitals stable.  Can be discharged to short-term skilled nursing today if bed available.

## 2020-07-11 NOTE — Discharge Summary (Signed)
Patient ID: Juan Hudson MRN: [id number] DOB/AGE: 1946-01-20 74 y.o.  Admit date: 07/07/2020 Discharge date: 07/11/2020  Admission Diagnoses:  Principal Problem:   Unilateral primary osteoarthritis, right hip Active Problems:   Status post total replacement of left hip   Status post total replacement of right hip   Discharge Diagnoses:  Same  Past Medical History:  Diagnosis Date  . Allergy   . Cancer ([place])    skin cancer  . Cataract    early  . GERD (gastroesophageal reflux disease)   . HLD (hyperlipidemia)   . Hypertension   . Rosacea   . Sleep apnea    cpap  . Systolic murmur     Surgeries: Procedure(s): RIGHT TOTAL HIP ARTHROPLASTY ANTERIOR APPROACH on 07/07/2020   Consultants:   Discharged Condition: Improved  Hospital Course: Juan Hudson is an 74 y.o. male who was admitted 07/07/2020 for operative treatment ofUnilateral primary osteoarthritis, right hip. Patient has severe unremitting pain that affects sleep, daily activities, and work/hobbies. After pre-op clearance the patient was taken to the operating room on 07/07/2020 and underwent  Procedure(s): RIGHT TOTAL HIP ARTHROPLASTY ANTERIOR APPROACH.    Patient was given perioperative antibiotics:  Anti-infectives (From admission, onward)   Start     Dose/Rate Route Frequency Ordered Stop   07/07/20 2000  ceFAZolin (ANCEF) IVPB 1 g/50 mL premix        1 g 100 mL/hr over 30 Minutes Intravenous Every 6 hours 07/07/20 1545 07/08/20 0334   07/07/20 1000  ceFAZolin (ANCEF) IVPB 2g/100 mL premix        2 g 200 mL/hr over 30 Minutes Intravenous On call to O.R. 07/07/20 0955 07/07/20 1332       Patient was given sequential compression devices, early ambulation, and chemoprophylaxis to prevent DVT.  Patient benefited maximally from hospital stay and there were no complications.    Recent vital signs:  Patient Vitals for the past 24 hrs:  BP Temp Temp src Pulse Resp SpO2  07/11/20 0527 115/68 98.5 F  (36.9 C) Oral 99 16 97 %  07/10/20 2146 108/64 97.8 F (36.6 C) Oral 90 16 96 %  07/10/20 2045 -- -- -- -- 18 --  07/10/20 1405 109/61 98 F (36.7 C) Oral (!) 101 18 100 %     Recent laboratory studies:  Recent Labs    07/10/20 0816  WBC 7.7  HGB 9.8*  HCT 29.9*  PLT 158     Discharge Medications:   Allergies as of 07/11/2020      Reactions   Codeine Nausea Only      Medication List    STOP taking these medications   aspirin 81 MG tablet Replaced by: aspirin 81 MG chewable tablet     TAKE these medications   ascorbic acid 500 MG tablet Commonly known as: VITAMIN C Take 500 mg by mouth daily.   aspirin 81 MG chewable tablet Chew 1 tablet (81 mg total) by mouth 2 (two) times daily. Replaces: aspirin 81 MG tablet   B COMPLEX 100 PO Take 1 tablet by mouth daily.   Centrum tablet Take 1 tablet by mouth daily.   Fish Oil 1000 MG Caps Take 1,000 mg by mouth daily.   irbesartan-hydrochlorothiazide 300-12.5 MG tablet Commonly known as: AVALIDE Take 1 tablet by mouth at bedtime.   methocarbamol 500 MG tablet Commonly known as: ROBAXIN Take 1 tablet (500 mg total) by mouth every 6 (six) hours as needed for muscle spasms.   oxyCODONE  5 MG immediate release tablet Commonly known as: Oxy IR/ROXICODONE Take 1 tablet (5 mg total) by mouth every 4 (four) hours as needed for moderate pain (pain score 4-6).   pravastatin 40 MG tablet Commonly known as: PRAVACHOL Take 40 mg by mouth daily.   testosterone cypionate 200 MG/ML injection Commonly known as: DEPOTESTOSTERONE CYPIONATE Inject 100 mg into the muscle every Saturday.   Vitamin D 125 MCG (5000 UT) Caps Take 5,000 Units by mouth daily.   Zinc 25 MG Tabs Take 25 mg by mouth daily.            Durable Medical Equipment  (From admission, onward)         Start     Ordered   07/07/20 1546  DME 3 n 1  Once        07/07/20 1545   07/07/20 1546  DME Walker rolling  Once       Question Answer Comment   Walker: With 5 Inch Wheels   Patient needs a walker to treat with the following condition Status post total replacement of right hip      07/07/20 1545          Diagnostic Studies: DG Pelvis Portable  Result Date: 07/07/2020 CLINICAL DATA:  Right hip arthroplasty. EXAM: PORTABLE PELVIS 1-2 VIEWS COMPARISON:  Pelvis radiograph 05/03/2020 FINDINGS: Right hip arthroplasty in expected alignment. No periprosthetic lucency or fracture. Femoral stem is midline. Pubic rami are intact. Joint space narrowing and subchondral cystic changes in the left femoral head with slight flattening, unchanged from recent imaging. Recent postsurgical change includes air and edema in the soft tissues with lateral skin staples. IMPRESSION: Right hip arthroplasty without immediate postoperative complication. Electronically Signed   By: Keith Rake M.D.   On: 07/07/2020 16:46   DG C-Arm 1-60 Min-No Report  Result Date: 07/07/2020 Fluoroscopy was utilized by the requesting physician.  No radiographic interpretation.   CT VENOGRAM ABD/PEL  Result Date: 06/20/2020 CLINICAL DATA:  History of varicose vein surgery with persistent right lower leg swelling for the past 3 months. Evaluate for central venous occlusion. EXAM: CT ABDOMEN AND PELVIS WITH CONTRAST TECHNIQUE: Multidetector CT imaging of the abdomen and pelvis was performed using the standard protocol following bolus administration of intravenous contrast. CONTRAST:  120mL ISOVUE-370 IOPAMIDOL (ISOVUE-370) INJECTION 76% COMPARISON:  CT abdomen pelvis-05/05/2019; lumbar spine CT-01/05/2020; lumbar spine radiographs-01/11/2020 FINDINGS: Lower chest: Limited visualization of the lower thorax is negative for focal airspace opacity or pleural effusion. Normal heart size. Coronary artery calcifications. No pericardial effusion. Hepatobiliary: Normal hepatic contour. No discrete hepatic lesions. Normal appearance of the gallbladder given degree distention. No radiopaque  gallstones. No intra or extrahepatic biliary ductal dilatation. No ascites. Pancreas: Normal appearance of the pancreas. Spleen: Note is again made of an approximately 1.2 cm flash filling hemangioma involving the anterior tip of the spleen (image 25, series 2, similar to abdominal CT performed 04/2019. No perisplenic stranding. Adrenals/Urinary Tract: There is symmetric enhancement and excretion of the bilateral kidneys. No renal stones on this postcontrast examination. No discrete renal lesions. No urinary obstruction or perinephric stranding. There is mild thickening of the crux of the bilateral adrenal glands without discrete nodule. The urinary bladder is underdistended. Stomach/Bowel: Rather extensive colonic diverticulosis, primarily involving the descending and sigmoid colon, without evidence of superimposed acute diverticulitis. Moderate colonic stool burden without evidence of enteric obstruction. Normal appearance of the terminal ileum and the appendix. No discrete areas of bowel wall thickening. Small hiatal hernia. No  pneumoperitoneum, pneumatosis or portal venous gas. Vascular/Lymphatic: Moderate amount of mixed calcified and noncalcified atherosclerotic plaque throughout a normal caliber abdominal aorta, not definitely resulting in hemodynamically significant narrowing on this non CTA examination. The major branch vessels of the abdominal aorta appear patent on this non CTA examination. Mild ectasia of the bilateral common iliac arteries the right measuring approximately 2.2 cm, the left measuring approximately 2.0 cm (image 58, series 2). The dominant large right anterior periarticular fluid collection results in mass effect upon the right common femoral vein however the pelvic venous system and IVC appear widely patent. No bulky retroperitoneal, mesenteric, pelvic or inguinal lymphadenopathy. Reproductive: Borderline enlarged heterogeneously enhancing prostate with mass effect on the undersurface of  the urinary bladder. The prostate measures approximately 6.1 x 4.6 x 5.1 cm (axial image 84, series 2; sagittal image 99, series 8). Other: Subcutaneous edema about the midline of the low back. Musculoskeletal: Severe degenerative change of the bilateral hips with complete joint space loss bone-on-bone articulation subchondral sclerosis and cyst formation (representative coronal image 77, series 6), progressed compared to abdominal CT performed 05/04/2020. These findings are associated with development of complex adjacent fluid collections about the bilateral hips which likely communicate with the bilateral hip joint spaces with dominant collection anterior to the right hip joint space measuring at least 9.7 x 4.3 x 4.6 cm (axial image 83, series 2; coronal image 57, series 6), and dominant collection anterior to the left hip joint space measuring approximately 6.9 x 3.1 x 4.1 cm (coronal image 48, series 6; axial image 80, series 2). Note, while incompletely imaged the pelvic extension of the right-sided periarticular fluid collection was not seen on lumbar spine CT performed 12/2019. Post L2-L4 paraspinal fusion intervertebral disc space replacement without evidence of hardware failure or loosening. Stigmata of dish within the lower thoracic spine. Old/healed moderate (approximately 30%) compression deformity involving the superior endplate of L1 without associated fracture line or paraspinal hematoma, similar to lumbar spine CT performed 01/05/2020 IMPRESSION: 1. Findings worrisome for septic arthritis involving the bilateral hips with marked progression of bilateral hip degenerative change and development of large bilateral complex fluid collections which likely communicate with the bilateral hip joint spaces. While incompletely imaged, the pelvic extension of the right-sided periarticular fluid collection was not seen lumbar spine CT performed 12/2019. Emergent orthopedic consultation is advised. 2. Dominant  right-sided at least 9.7 cm periarticular fluid collection results in mass effect upon the right common femoral vein however the pelvic venous systems and IVC appear widely patent. 3. Post L2-L4 paraspinal fusion and intervertebral disc space replacement without evidence of hardware failure or loosening. 4. Moderate amount of atherosclerotic plaque within a normal caliber abdominal aorta. Aortic Atherosclerosis (ICD10-I70.0). 5. Incidentally noted fusiform ectasia of the bilateral common iliac arteries, the right measuring 2.2 cm, the left measuring 2.0 cm. 6. Coronary calcifications. Critical Value/emergent results were called by telephone at the time of interpretation on 06/20/2020 at 10:40 am to provider [facility] , who verbally acknowledged these results. Electronically Signed   By: Sandi Mariscal M.D.   On: 06/20/2020 10:53   DG HIP OPERATIVE UNILAT W OR W/O PELVIS RIGHT  Result Date: 07/07/2020 CLINICAL DATA:  Right hip replacement EXAM: OPERATIVE RIGHT HIP WITH PELVIS COMPARISON:  None. FLUOROSCOPY TIME:  Radiation Exposure Index (as provided by the fluoroscopic device): 2.09 mGy If the device does not provide the exposure index: Fluoroscopy Time:  13 seconds Number of Acquired Images:  3 FINDINGS: Right hip replacement is noted  in satisfactory position. No acute soft tissue abnormality is noted. IMPRESSION: Right hip replacement Electronically Signed   By: Inez Catalina M.D.   On: 07/07/2020 15:07    Disposition: Discharge disposition: 03-Skilled [place]    Mcarthur Rossetti, MD Follow up in 2 week(s).   Specialty: Orthopedic Surgery Contact information: [place] [address] [phone]        [facility] Follow up.   Specialty: [place]: agency will provide home health therapy Contact information: [place] [address] [phone]                 Signed: Mcarthur Rossetti 07/11/2020, 6:59 AM

## 2020-07-11 NOTE — TOC Transition Note (Addendum)
Transition of Care [facility]) - CM/SW Discharge Note   Patient Details  Name: Juan Hudson MRN: [id number] Date of Birth: 03/16/1946  Transition of Care [facility]) CM/SW Contact:  Lia Hopping, [place] Phone Number: 07/11/2020, 3:10 PM   Clinical Narrative:    HTA approval- [phone] 7 days, Facility provided details. EMS transport denied, offered Peer to Peer. Pt. Declines having physician complete "just for transport." [facility] transportation agreeable to transport the patient.  Patient notified agreeable to the plan.   Final next level of care: Skilled Nursing Facility Barriers to Discharge: Barriers Resolved   Patient Goals and CMS Choice Patient states their goals for this hospitalization and ongoing recovery are:: to go home with therapy CMS Medicare.gov Compare Post Acute Care list provided to:: Patient Choice offered to / list presented to : Patient  Discharge Placement PASRR number recieved: 07/10/20            Patient chooses bed at:  ([facility]) Patient to be transferred to facility by: Facility Transportation Name of family member notified: Patien to notify his friend Patient and family notified of of transfer: 07/11/20  Discharge Plan and Services   Discharge Planning Services: CM Consult Post Acute Care Choice: Home Health          DME Arranged: Gilford Rile rolling DME Agency: [employer] Date DME Agency Contacted: 07/08/20 Time DME Agency Contacted: 1528 Representative spoke with at DME Agency: [facility] HH Arranged: PT [place]: [facility] [employer]     Representative spoke with at [place]: pre-arranged in MD office  Social Determinants of Health ([place]) Interventions     Readmission Risk Interventions No flowsheet data found.

## 2020-07-11 NOTE — Progress Notes (Signed)
Physical Therapy Treatment Patient Details Name: Juan Hudson MRN: [id number] DOB: [birth year] Today's Date: 07/11/2020    History of Present Illness Patient is 74 y.o. male s/p Rt THA anterior approach on 07/07/20 with PMH significant for OA, HTN, HLD, GERD, L2-4 fusion.    PT Comments    POD # 3 Pt progressing slowly and will need ST Rehab at [facility] Assisted with amb to bathroom then in hallway.  General Gait Details: assisted to bathroom then in hallway an increased distance.  25% VC's on proper walker to self distance and 50% VC's safety with turns.  Unstaedy.  "other" hip is "bad". Then returned to room to perform some TE's following HEP handout.  Instructed on proper tech, freq as well as use of ICE.   Pt lives home alone.    Follow Up Recommendations  SNF     Equipment Recommendations       Recommendations for Other Services       Precautions / Restrictions Precautions Precautions: Fall Restrictions Weight Bearing Restrictions: No    Mobility  Bed Mobility Overal bed mobility: Needs Assistance Bed Mobility: Supine to Sit     Supine to sit: Min assist     General bed mobility comments: increased time esp to complete scooting to EOB  Transfers Overall transfer level: Needs assistance Equipment used: Rolling walker (2 wheeled) Transfers: Sit to/from [employer] Sit to Stand: Min guard Stand pivot transfers: Min guard;Min assist       General transfer comment: required 25% VC's on proper hand placement and 50% VC's safety with turns esp to keep walker at proper distance with turns and caution to balance.   Pt's "other" hip is "bad" and will need a THR.  Ambulation/Gait Ambulation/Gait assistance: Min guard Gait Distance (Feet): 42 Feet Assistive device: Rolling walker (2 wheeled) Gait Pattern/deviations: Step-to pattern;Step-through pattern;Decreased step length - right;Decreased step length - left;Shuffle;Trunk flexed Gait velocity: decr    General Gait Details: assisted to bathroom then in hallway an increased distance.  25% VC's on proper walker to self distance and 50% VC's safety with turns.  Unstaedy.  "other" hip is "bad".   Stairs             Wheelchair Mobility    Modified Rankin (Stroke Patients Only)       Balance                                            Cognition Arousal/Alertness: Awake/alert Behavior During Therapy: WFL for tasks assessed/performed Overall Cognitive Status: Within Functional Limits for tasks assessed                                 General Comments: AxO x 3 very pleasant retired Magazine features editor but present with impaired cognition/judgement in reguards to safety and balance.  Slow/delayed and required repeAT vc's to stay on task      Exercises   Total Hip Replacement TE's following HEP Handout 10 reps ankle pumps 05 reps knee presses 05 reps heel slides 05 reps SAQ's 05 reps ABD Instructed how to use a belt loop to assist  Followed by ICE     General Comments        Pertinent Vitals/Pain Pain Assessment: 0-10 Pain Score: 6  Pain Location: R hip Pain Descriptors /  Indicators: Sore;Discomfort;Tender Pain Intervention(s): Monitored during session;Premedicated before session;Repositioned;Ice applied    Home Living                      Prior Function            PT Goals (current goals can now be found in the care plan section) Progress towards PT goals: Progressing toward goals    Frequency    7X/week      PT Plan Current plan remains appropriate    Co-evaluation              AM-PAC PT "6 Clicks" Mobility   Outcome Measure  Help needed turning from your back to your side while in a flat bed without using bedrails?: A Little   Help needed moving to and from a bed to a chair (including a wheelchair)?: A Little Help needed standing up from a chair using your arms (e.g., wheelchair or bedside chair)?: A  Little Help needed to walk in hospital room?: A Little Help needed climbing 3-5 steps with a railing? : A Lot 6 Click Score: 14    End of Session Equipment Utilized During Treatment: Gait belt Activity Tolerance: Patient limited by fatigue Patient left: in chair;with call bell/phone within reach;with family/visitor present Nurse Communication: Mobility status PT Visit Diagnosis: Muscle weakness (generalized) (M62.81);Difficulty in walking, not elsewhere classified (R26.2)     Time: 1030-1055 PT Time Calculation (min) (ACUTE ONLY): 25 min  Charges:  $Gait Training: 8-22 mins $Therapeutic Exercise: 8-22 mins                     Rica Koyanagi  PTA Acute  Rehabilitation Services Pager      [phone] Office      [phone]

## 2020-07-11 NOTE — Progress Notes (Addendum)
RN reviewed discharge instructions with patient and family. All questions answered.   Paperwork given. Prescriptions electronically sent to patient pharmacy.    NT rolled patient down with all belongings to transportation vehicle from [place].    Benedetto Goad, RN

## 2020-07-12 ENCOUNTER — Encounter: Payer: Self-pay | Admitting: Licensed Clinical Social Worker

## 2020-07-12 DIAGNOSIS — Z471 Aftercare following joint replacement surgery: Secondary | ICD-10-CM | POA: Diagnosis not present

## 2020-07-12 DIAGNOSIS — M5416 Radiculopathy, lumbar region: Secondary | ICD-10-CM | POA: Diagnosis not present

## 2020-07-12 DIAGNOSIS — L03115 Cellulitis of right lower limb: Secondary | ICD-10-CM | POA: Diagnosis not present

## 2020-07-12 DIAGNOSIS — Z789 Other specified health status: Secondary | ICD-10-CM | POA: Diagnosis not present

## 2020-07-12 DIAGNOSIS — M48062 Spinal stenosis, lumbar region with neurogenic claudication: Secondary | ICD-10-CM | POA: Diagnosis not present

## 2020-07-12 DIAGNOSIS — K219 Gastro-esophageal reflux disease without esophagitis: Secondary | ICD-10-CM | POA: Diagnosis not present

## 2020-07-12 DIAGNOSIS — R7989 Other specified abnormal findings of blood chemistry: Secondary | ICD-10-CM | POA: Diagnosis not present

## 2020-07-12 DIAGNOSIS — I10 Essential (primary) hypertension: Secondary | ICD-10-CM | POA: Diagnosis not present

## 2020-07-12 DIAGNOSIS — G4710 Hypersomnia, unspecified: Secondary | ICD-10-CM | POA: Diagnosis not present

## 2020-07-12 DIAGNOSIS — E7800 Pure hypercholesterolemia, unspecified: Secondary | ICD-10-CM | POA: Diagnosis not present

## 2020-07-12 DIAGNOSIS — R011 Cardiac murmur, unspecified: Secondary | ICD-10-CM | POA: Diagnosis not present

## 2020-07-12 DIAGNOSIS — R2681 Unsteadiness on feet: Secondary | ICD-10-CM | POA: Diagnosis not present

## 2020-07-12 DIAGNOSIS — M6281 Muscle weakness (generalized): Secondary | ICD-10-CM | POA: Diagnosis not present

## 2020-07-12 DIAGNOSIS — E349 Endocrine disorder, unspecified: Secondary | ICD-10-CM | POA: Diagnosis not present

## 2020-07-12 DIAGNOSIS — G4733 Obstructive sleep apnea (adult) (pediatric): Secondary | ICD-10-CM | POA: Diagnosis not present

## 2020-07-12 DIAGNOSIS — Z96641 Presence of right artificial hip joint: Secondary | ICD-10-CM | POA: Diagnosis not present

## 2020-07-12 DIAGNOSIS — E785 Hyperlipidemia, unspecified: Secondary | ICD-10-CM | POA: Diagnosis not present

## 2020-07-12 DIAGNOSIS — R41841 Cognitive communication deficit: Secondary | ICD-10-CM | POA: Diagnosis not present

## 2020-07-12 NOTE — Progress Notes (Unsigned)
CSW alerted by MD, patient left [facility] at [facility] and is need of rehab placement from home. CSW learn the patient is agreeable to [facility]. CSW reached out to [place] [facility] and confirm they will accept the patient pending updated HTA authorization. Lexine Baton states because the patient discharged less than 24 hours ago his  d/c summary and orders are still active, no new orders needed. CSW notified [place] (HTA) on call representative Marlowe Kays. HTA on call nurse [place] assisted with a new authorization # [id number].Patient notified, he has arrange for his friend to transport him. Patient reminded to take Cpap machine to the SNF.  Patient appreciative of CSW intervention.  Admission Coordinator [place] at [facility] given the patient contact information to coordinate time of arrival.

## 2020-07-17 ENCOUNTER — Telehealth: Payer: Self-pay

## 2020-07-17 NOTE — Telephone Encounter (Signed)
Patient called he stated his bandage keeps rolling up about a inch but hasn't exposed surgical site, he wants to know if its okay if the rehab place can change the bandage for him or if the facility can repair it with tape. CB:[phone]

## 2020-07-18 ENCOUNTER — Encounter: Payer: Self-pay | Admitting: Internal Medicine

## 2020-07-18 ENCOUNTER — Non-Acute Institutional Stay (SKILLED_NURSING_FACILITY): Payer: PPO | Admitting: Internal Medicine

## 2020-07-18 DIAGNOSIS — Z96641 Presence of right artificial hip joint: Secondary | ICD-10-CM

## 2020-07-18 DIAGNOSIS — M48062 Spinal stenosis, lumbar region with neurogenic claudication: Secondary | ICD-10-CM

## 2020-07-18 DIAGNOSIS — E349 Endocrine disorder, unspecified: Secondary | ICD-10-CM

## 2020-07-18 DIAGNOSIS — Z789 Other specified health status: Secondary | ICD-10-CM | POA: Diagnosis not present

## 2020-07-18 DIAGNOSIS — E785 Hyperlipidemia, unspecified: Secondary | ICD-10-CM

## 2020-07-18 DIAGNOSIS — I10 Essential (primary) hypertension: Secondary | ICD-10-CM

## 2020-07-18 DIAGNOSIS — G4733 Obstructive sleep apnea (adult) (pediatric): Secondary | ICD-10-CM | POA: Diagnosis not present

## 2020-07-18 NOTE — Progress Notes (Signed)
Provider:  Rexene Edison. Mariea Clonts, D.O., C.M.D. Location:  [place] Room Number: 107/P Place of Service:  SNF (31)  PCP: Crist Infante, MD Patient Care Team: Crist Infante, MD as PCP - General (Internal Medicine) Kristeen Miss, MD as Consulting Physician (Neurosurgery)  Extended Emergency Contact Information Primary Emergency Contact: Bufford Lope Mobile Phone: [phone] Relation: Friend Secondary Emergency Contact: Allcock-Ford,Caitlin  [place] Phone: [phone] Mobile Phone: [phone] Relation: Daughter  Code Status: FULL Goals of Care: Advanced Directive information Advanced Directives 07/19/2020  Does Patient Have a Medical Advance Directive? No  Type of Advance Directive -  Does patient want to make changes to medical advance directive? No - Patient declined  Copy of [place] in Chart? -  Would patient like information on creating a medical advance directive? -      Chief Complaint  Patient presents with  . 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  He has a past medical history significant for arthritis of both hips, lumbar spinal stenosis with neurogenic claudication systolic heart murmur, obstructive sleep apnea, metatarsalgia of the right foot and loss of his transverse plantar fat arch, GERD, hyperlipidemia, hypertension.  He was admitted December 17 for left total hip arthroplasty and had failed conservative therapies it was causing severe unremitting pain that affected his sleep, daily activities and work and hobbies.  He underwent right total hip arthroplasty by anterior approach by Dr. Zollie Beckers December 17.  He had perioperative antibiotics with Ancef.  He was given sequential compression  devices, early ambulation chemoprophylaxis to prevent DVT with asked for an 81 mg p.o. twice daily.  His pain regimen includes oxycodone 5 mg every 4 hours as needed for moderate pain and Robaxin 500 mg every 6 hours as needed for muscle spasms.  After recovery he will return to taking aspirin 81 mg chewable tablet per orthopedic instructions.  His discharge hemoglobin December 28 was 9.8 indicating postop anemia.  He is here for PT OT with a goal of returning home.  He is to follow-up with Dr. Ninfa Linden in 2 weeks.  Of note he was discharged from [facility] and sent to [facility] on the 21st but signed out AMA went home (after the commode in his room was grossly dirty and staff would not speak with him) overnight and now comes here for his therapy.  He has had his Pfizer Covid vaccines in January and February and a booster in August.  When seen, he was up and about and ambulating with his walker in his room.  He had minimal pain and had stopped his oxycodone a week ago due to bad dreams from it.  He is eager to f/u with Dr. Ninfa Linden and hopes to go home afterward in 2 days after the appt.  Past Medical History:  Diagnosis Date  . Allergy   . Cancer ([place])    skin cancer  . Cataract    early  . GERD (gastroesophageal reflux disease)   . HLD (hyperlipidemia)   . Hypertension   . Rosacea   . Sleep apnea    cpap  . Systolic murmur    Past Surgical History:  Procedure Laterality Date  . ABDOMINAL HERNIA REPAIR  x2  . ANTERIOR LAT LUMBAR FUSION N/A 01/11/2020   Procedure: Lumbar Two-three Lumbar Three-Four Anterolateral decompression/fusion;  Surgeon: Barnett Abu, MD;  Location: [facility] OR;  Service: Neurosurgery;  Laterality: N/A;  anterolateral  . APPLICATION OF ROBOTIC ASSISTANCE FOR SPINAL PROCEDURE N/A 01/11/2020   Procedure: APPLICATION OF ROBOTIC ASSISTANCE FOR SPINAL PROCEDURE;  Surgeon: Barnett Abu, MD;  Location: [facility] OR;  Service: Neurosurgery;  Laterality:  N/A;  posterior  . COLONOSCOPY  04-01-2003   tics and hems  . HEMORRHOID SURGERY    . INGUINAL HERNIA REPAIR    . LUMBAR PERCUTANEOUS PEDICLE SCREW 2 LEVEL N/A 01/11/2020   Procedure: Percutaneous pedicle screw fixation from Lumbar Two to Lumbar Four;  Surgeon: Barnett Abu, MD;  Location: [facility] OR;  Service: Neurosurgery;  Laterality: N/A;  posterior  . NOSE SURGERY    . TONSILLECTOMY    . TOTAL HIP ARTHROPLASTY Right 07/07/2020   Procedure: RIGHT TOTAL HIP ARTHROPLASTY ANTERIOR APPROACH;  Surgeon: Kathryne Hitch, MD;  Location: [facility] ORS;  Service: Orthopedics;  Laterality: Right;  . uvuloplasty      Social History   Socioeconomic History  . Marital status: Divorced    Spouse name: Not on file  . Number of children: Not on file  . Years of education: Not on file  . Highest education level: Not on file  Occupational History  . Occupation: Gaffer: [employer]  Tobacco Use  . Smoking status: Former Smoker    Types: Cigarettes    Quit date: 07/22/1964    Years since quitting: 56.0  . Smokeless tobacco: Never Used  . Tobacco comment: 02/23/2019 reports smoking socially in high school  Vaping Use  . Vaping Use: Never used  Substance and Sexual Activity  . Alcohol use: Yes    Alcohol/week: 4.0 standard drinks    Types: 4 Standard drinks or equivalent per week    Comment: occasionally  . Drug use: No  . Sexual activity: Not on file  Other Topics Concern  . Not on file  Social History Narrative   Lives alone   [place] regularly[place]   Left Handed.   Social Determinants of Health   Financial Resource Strain: Not on file  Food Insecurity: Not on file  Transportation Needs: Not on file  Physical Activity: Not on file  Stress: Not on file  Social Connections: Not on file    reports that he quit smoking about 56 years ago. His smoking use included cigarettes. He has never used smokeless tobacco. He reports current alcohol use of about 4.0 standard  drinks of alcohol per week. He reports that he does not use drugs.  Functional Status Survey:    Family History  Problem Relation Age of Onset  . Heart attack Father        pacemaker  . Dementia Mother   . Colon cancer Neg Hx   . Rectal cancer Neg Hx   . Stomach cancer Neg Hx     Health Maintenance  Topic Date Due  . Hepatitis C Screening  Never done  . TETANUS/TDAP  Never done  . PNA vac Low Risk Adult (2 of 2 - PCV13) 02/13/2012  . COVID-19 Vaccine (3 - Booster for Pfizer series) 09/18/2020  . COLONOSCOPY (Pts 45-46yrs Insurance coverage will need to be confirmed)  08/18/2024  . INFLUENZA VACCINE  Completed    Allergies  Allergen Reactions  . Codeine Nausea Only  . Doxycycline Rash    Rash on  upper extremities after one dose    Outpatient Encounter Medications as of 07/18/2020  Medication Sig  . ascorbic acid (VITAMIN C) 500 MG tablet Take 500 mg by mouth daily.  . B Complex Vitamins (B COMPLEX 100 PO) Take 1 tablet by mouth daily.  . Cholecalciferol (VITAMIN D) 125 MCG (5000 UT) CAPS Take 5,000 Units by mouth daily.  . Multiple Vitamins-Minerals (CENTRUM) tablet Take 1 tablet by mouth daily.   . Omega-3 Fatty Acids (FISH OIL) 1000 MG CAPS Take 1,000 mg by mouth daily.   Marland Kitchen oxyCODONE (OXY IR/ROXICODONE) 5 MG immediate release tablet Take 1 tablet (5 mg total) by mouth every 4 (four) hours as needed for moderate pain (pain score 4-6).  . Zinc 25 MG TABS Take 25 mg by mouth daily.  . [DISCONTINUED] aspirin 81 MG chewable tablet Chew 1 tablet (81 mg total) by mouth 2 (two) times daily.  . [DISCONTINUED] irbesartan-hydrochlorothiazide (AVALIDE) 300-12.5 MG tablet Take 1 tablet by mouth at bedtime.  . [DISCONTINUED] methocarbamol (ROBAXIN) 500 MG tablet Take 1 tablet (500 mg total) by mouth every 6 (six) hours as needed for muscle spasms.  . [DISCONTINUED] pravastatin (PRAVACHOL) 40 MG tablet Take 40 mg by mouth daily.   . [DISCONTINUED] testosterone cypionate  (DEPOTESTOSTERONE CYPIONATE) 200 MG/ML injection Inject 100 mg into the muscle every Saturday.    No facility-administered encounter medications on file as of 07/18/2020.    Review of Systems  Constitutional: Negative for chills, fever and malaise/fatigue.  HENT: Negative for congestion, hearing loss and sore throat.   Eyes: Negative for blurred vision.       Glasses  Respiratory: Negative for cough and shortness of breath.   Cardiovascular: Positive for leg swelling. Negative for chest pain and palpitations.       Some generalized swelling of right leg  Gastrointestinal: Negative for abdominal pain, blood in stool, constipation and melena.  Genitourinary: Negative for dysuria.  Musculoskeletal: Positive for joint pain. Negative for falls.       Minimal pain when up walking  Skin: Negative for itching and rash.  Neurological: Negative for dizziness, loss of consciousness and weakness.  Endo/Heme/Allergies: Bruises/bleeds easily.  Psychiatric/Behavioral: Negative for depression and memory loss. The patient is not nervous/anxious and does not have insomnia.     Vitals:   07/18/20 1341  BP: 110/60  Pulse: 70  Temp: 97.6 F (36.4 C)  Weight: 177 lb 12.8 oz (80.6 kg)  Height: 5\' 10"  (1.778 m)   Body mass index is 25.51 kg/m. Physical Exam Vitals reviewed.  Constitutional:      General: He is not in acute distress.    Appearance: Normal appearance. He is not toxic-appearing.  HENT:     Head: Normocephalic and atraumatic.     Right Ear: External ear normal.     Left Ear: External ear normal.     Nose: No congestion.     Mouth/Throat:     Pharynx: Oropharynx is clear.  Eyes:     Conjunctiva/sclera: Conjunctivae normal.     Pupils: Pupils are equal, round, and reactive to light.     Comments: glasses  Cardiovascular:     Rate and Rhythm: Normal rate and regular rhythm.     Pulses: Normal pulses.     Heart sounds: Normal heart sounds.  Pulmonary:     Effort: Pulmonary  effort is normal.     Breath sounds: Normal breath sounds. No wheezing, rhonchi or rales.  Abdominal:     General: Bowel  sounds are normal.     Palpations: Abdomen is soft.  Musculoskeletal:        General: Normal range of motion.     Cervical back: Neck supple.     Right lower leg: Edema present.  Lymphadenopathy:     Cervical: No cervical adenopathy.  Skin:    General: Skin is warm and dry.     Comments: Ecchymoses of toes on right; incision site looks good--drainage on dressing, but no significant erythema, warmth  Neurological:     General: No focal deficit present.     Mental Status: He is alert and oriented to person, place, and time.     Cranial Nerves: No cranial nerve deficit.     Motor: No weakness.     Gait: Gait abnormal.     Comments: Using walker  Psychiatric:        Mood and Affect: Mood normal.        Behavior: Behavior normal.        Thought Content: Thought content normal.        Judgment: Judgment normal.     Labs reviewed: Basic Metabolic Panel: Recent Labs    01/05/20 0911 07/08/20 0304  NA 140 136  K 4.3 4.1  CL 101 100  CO2 31 26  GLUCOSE 96 138*  BUN 27* 22  CREATININE 1.16 1.04  CALCIUM 9.2 8.4*   Liver Function Tests: No results for input(s): AST, ALT, ALKPHOS, BILITOT, PROT, ALBUMIN in the last 8760 hours. No results for input(s): LIPASE, AMYLASE in the last 8760 hours. No results for input(s): AMMONIA in the last 8760 hours. CBC: Recent Labs    07/04/20 0845 07/08/20 0304 07/10/20 0816  WBC 6.3 11.4* 7.7  HGB 15.4 11.3* 9.8*  HCT 46.8 34.1* 29.9*  MCV 91.6 92.2 92.6  PLT 194 181 158   Cardiac Enzymes: No results for input(s): CKTOTAL, CKMB, CKMBINDEX, TROPONINI in the last 8760 hours. BNP:   Assessment/Plan 1. Full code status - Full code  2. Status post total replacement of right hip Up and about  Wants to go home Thursday F/u Dr. Ninfa Linden at 2 wks which is Thursday at [facility]  Not using oxy at all due to  bad dreams   3. Lumbar stenosis with neurogenic claudication -no active complaints  4. Obstructive sleep apnea -cont home cpap  5. Essential hypertension -bp controlled with current regimen from home--cont same and monitor  6. Hyperlipidemia, unspecified hyperlipidemia type -cont home statin therapy  7. Testosterone deficiency -cont home tx  Family/ staff Communication: d/w snf nurse  Labs/tests ordered:  No new   L. , D.O. [facility] [address],  [address] Cell Phone (Mon-Fri 8am-5pm):  [phone] On Call:  [phone] & follow prompts after 5pm & weekends Office Phone:  [phone] Office Fax:  [phone]

## 2020-07-18 NOTE — Telephone Encounter (Signed)
LMOM for patient letting him know he can remove the bandage and put another back on or a dry dressing

## 2020-07-19 ENCOUNTER — Encounter: Payer: Self-pay | Admitting: Orthopaedic Surgery

## 2020-07-19 ENCOUNTER — Non-Acute Institutional Stay (SKILLED_NURSING_FACILITY): Payer: PPO | Admitting: Orthopedic Surgery

## 2020-07-19 ENCOUNTER — Encounter: Payer: Self-pay | Admitting: Orthopedic Surgery

## 2020-07-19 ENCOUNTER — Ambulatory Visit (INDEPENDENT_AMBULATORY_CARE_PROVIDER_SITE_OTHER): Payer: PPO | Admitting: Orthopaedic Surgery

## 2020-07-19 DIAGNOSIS — K219 Gastro-esophageal reflux disease without esophagitis: Secondary | ICD-10-CM

## 2020-07-19 DIAGNOSIS — R7989 Other specified abnormal findings of blood chemistry: Secondary | ICD-10-CM

## 2020-07-19 DIAGNOSIS — I10 Essential (primary) hypertension: Secondary | ICD-10-CM | POA: Diagnosis not present

## 2020-07-19 DIAGNOSIS — Z96641 Presence of right artificial hip joint: Secondary | ICD-10-CM

## 2020-07-19 DIAGNOSIS — E7800 Pure hypercholesterolemia, unspecified: Secondary | ICD-10-CM

## 2020-07-19 MED ORDER — TESTOSTERONE CYPIONATE 200 MG/ML IM SOLN: 100.0000 mg | INTRAMUSCULAR | 0 refills | Status: AC

## 2020-07-19 MED ORDER — DOXYCYCLINE HYCLATE 100 MG PO TABS: 100.0000 mg | ORAL_TABLET | Freq: Two times a day (BID) | ORAL | 0 refills | Status: DC

## 2020-07-19 MED ORDER — PRAVASTATIN SODIUM 40 MG PO TABS: 40.0000 mg | ORAL_TABLET | Freq: Every day | ORAL | 0 refills | Status: AC

## 2020-07-19 MED ORDER — ASPIRIN 81 MG PO CHEW: 81.0000 mg | CHEWABLE_TABLET | Freq: Every day | ORAL | 0 refills | Status: DC

## 2020-07-19 MED ORDER — IRBESARTAN-HYDROCHLOROTHIAZIDE 300-12.5 MG PO TABS: 1.0000 | ORAL_TABLET | Freq: Every day | ORAL | 0 refills | Status: AC

## 2020-07-19 MED ORDER — METHOCARBAMOL 500 MG PO TABS
500.0000 mg | ORAL_TABLET | Freq: Four times a day (QID) | ORAL | 0 refills | Status: DC | PRN
Start: 2020-07-19 — End: 2022-02-22

## 2020-07-19 NOTE — Progress Notes (Signed)
Location:    [facility] Room Number: 107/P Place of Service:  SNF (31)  [place], AGNP-C  PCP: Crist Infante, MD Patient Care Team: Crist Infante, MD as PCP - General (Internal Medicine) Kristeen Miss, MD as Consulting Physician (Neurosurgery)  Extended Emergency Contact Information Primary Emergency Contact: Bufford Lope Mobile Phone: [phone] Relation: Friend Secondary Emergency Contact: Fox-Ford,Caitlin  [place] Phone: [phone] Mobile Phone: [phone] Relation: Daughter  Code Status: Full Code Goals of care:  Advanced Directive information Advanced Directives 07/19/2020  Does Patient Have a Medical Advance Directive? No  Type of Advance Directive -  Does patient want to make changes to medical advance directive? No - Patient declined  Copy of [place] in Chart? -  Would patient like information on creating a medical advance directive? -     Allergies  Allergen Reactions  . Codeine Nausea Only    Chief Complaint  Patient presents with  . Discharge Note    Discharge Visit    HPI:  74 y.o. male seen today for discharge evaluation.   He has been a resident of [facility] since 12/17, seen at bedside today. PMH includes: obstructive sleep apnea, lumbar radiculopathy, osteoarthritis bilateral hips, metatarsalgia of right foot, and systolic heart murmur.   On 12/17 he underwent right anterior total hip arthroplasty by Dr. Ninfa Linden at [facility] Hospitalized from 12/17-12/21. Prior to surgery he was experiencing pain throughout various parts of the day, including sleep. He tolerated surgery well, PT recommended additional PT/OT services and he was discharged to rehab since he lived alone.   Today, he is alert and oriented x 4. Follows commands and can express needs. Ambulating > 100 ft, minimal assistance with front rolling walker. Remains  WBAT. Pain minimal with activity. No recent falls or injuries reported. Taking oxycodone and robaxin prn for pain and muscle spasms. He claims he has not taken any narcotics in the past day.  Having regular bowel movements. Appetite healthy.   Right lower extremity appear swollen and bruising over toes. He has a history of previous varicose vein surgery. He denies calf pain. I have reached out to Dr. Ninfa Linden for further guidance. He is planned to see Dr. Ninfa Linden at 1:30 today.   At this time, I believe he is appropriate for discharge. He states he has close friends that will be helping with transportation and at home. I have ordered home health PT. No DME needs at this time. I have advised patient to follow up with PCP in 1-2 weeks.    Past Medical History:  Diagnosis Date  . Allergy   . Cancer ([place])    skin cancer  . Cataract    early  . GERD (gastroesophageal reflux disease)   . HLD (hyperlipidemia)   . Hypertension   . Rosacea   . Sleep apnea    cpap  . Systolic murmur     Past Surgical History:  Procedure Laterality Date  . ABDOMINAL HERNIA REPAIR     x2  . ANTERIOR LAT LUMBAR FUSION N/A 01/11/2020   Procedure: Lumbar Two-three Lumbar Three-Four Anterolateral decompression/fusion;  Surgeon: Kristeen Miss, MD;  Location: [facility];  Service: Neurosurgery;  Laterality: N/A;  anterolateral  . APPLICATION OF ROBOTIC ASSISTANCE FOR SPINAL PROCEDURE N/A 01/11/2020   Procedure: APPLICATION OF ROBOTIC ASSISTANCE FOR SPINAL PROCEDURE;  Surgeon: Kristeen Miss, MD;  Location: [facility];  Service: Neurosurgery;  Laterality: N/A;  posterior  .  COLONOSCOPY  04-01-2003   tics and hems  . HEMORRHOID SURGERY    . INGUINAL HERNIA REPAIR    . LUMBAR PERCUTANEOUS PEDICLE SCREW 2 LEVEL N/A 01/11/2020   Procedure: Percutaneous pedicle screw fixation from Lumbar Two to Lumbar Four;  Surgeon: Barnett Abu, MD;  Location: [facility] OR;  Service: Neurosurgery;  Laterality: N/A;  posterior  . NOSE SURGERY    .  TONSILLECTOMY    . TOTAL HIP ARTHROPLASTY Right 07/07/2020   Procedure: RIGHT TOTAL HIP ARTHROPLASTY ANTERIOR APPROACH;  Surgeon: Kathryne Hitch, MD;  Location: [facility];  Service: Orthopedics;  Laterality: Right;  . uvuloplasty        reports that he quit smoking about 56 years ago. His smoking use included cigarettes. He has never used smokeless tobacco. He reports current alcohol use of about 4.0 standard drinks of alcohol per week. He reports that he does not use drugs. Social History   Socioeconomic History  . Marital status: Divorced    Spouse name: Not on file  . Number of children: Not on file  . Years of education: Not on file  . Highest education level: Not on file  Occupational History  . Occupation: Gaffer: [employer]  Tobacco Use  . Smoking status: Former Smoker    Types: Cigarettes    Quit date: 07/22/1964    Years since quitting: 56.0  . Smokeless tobacco: Never Used  . Tobacco comment: 02/23/2019 reports smoking socially in high school  Vaping Use  . Vaping Use: Never used  Substance and Sexual Activity  . Alcohol use: Yes    Alcohol/week: 4.0 standard drinks    Types: 4 Standard drinks or equivalent per week    Comment: occasionally  . Drug use: No  . Sexual activity: Not on file  Other Topics Concern  . Not on file  Social History Narrative   Lives alone   [place]   Left Handed.   Social Determinants of Health   Financial Resource Strain: Not on file  Food Insecurity: Not on file  Transportation Needs: Not on file  Physical Activity: Not on file  Stress: Not on file  Social Connections: Not on file  Intimate Partner Violence: Not on file   Functional Status Survey:    Allergies  Allergen Reactions  . Codeine Nausea Only    Pertinent  Health Maintenance Due  Topic Date Due  . PNA vac Low Risk Adult (2 of 2 - PCV13) 02/13/2012  . COLONOSCOPY (Pts 45-59yrs Insurance coverage will need to be  confirmed)  08/18/2024  . INFLUENZA VACCINE  Completed    Medications: Allergies as of 07/19/2020      Reactions   Codeine Nausea Only      Medication List       Accurate as of July 19, 2020  9:19 AM. If you have any questions, ask your nurse or doctor.        ascorbic acid 500 MG tablet Commonly known as: VITAMIN C Take 500 mg by mouth daily.   aspirin 81 MG chewable tablet Chew 1 tablet (81 mg total) by mouth 2 (two) times daily.   B COMPLEX 100 PO Take 1 tablet by mouth daily.   Centrum tablet Take 1 tablet by mouth daily.   Fish Oil 1000 MG Caps Take 1,000 mg by mouth daily.   irbesartan-hydrochlorothiazide 300-12.5 MG tablet Commonly known as: AVALIDE Take 1 tablet by mouth at bedtime.   methocarbamol 500  MG tablet Commonly known as: ROBAXIN Take 1 tablet (500 mg total) by mouth every 6 (six) hours as needed for muscle spasms.   oxyCODONE 5 MG immediate release tablet Commonly known as: Oxy IR/ROXICODONE Take 1 tablet (5 mg total) by mouth every 4 (four) hours as needed for moderate pain (pain score 4-6).   pravastatin 40 MG tablet Commonly known as: PRAVACHOL Take 40 mg by mouth daily.   testosterone cypionate 200 MG/ML injection Commonly known as: DEPOTESTOSTERONE CYPIONATE Inject 100 mg into the muscle every Saturday.   Vitamin D 125 MCG (5000 UT) Caps Take 5,000 Units by mouth daily.   Zinc 25 MG Tabs Take 25 mg by mouth daily.       Review of Systems  Constitutional: Negative for activity change and fever.  Respiratory: Negative for cough, shortness of breath and wheezing.   Gastrointestinal: Negative for abdominal pain, constipation, diarrhea and nausea.  Genitourinary: Positive for frequency. Negative for dysuria and hematuria.  Musculoskeletal: Positive for arthralgias and myalgias.       Right hip pain  Skin:       Right hip surgical incision  Neurological: Negative for dizziness, weakness and headaches.   Psychiatric/Behavioral: Negative for dysphoric mood and sleep disturbance. The patient is not nervous/anxious.     Vitals:   07/19/20 0907  BP: 137/80  Pulse: 82  Resp: 19  Temp: 98.7 F (37.1 C)  SpO2: 100%  Weight: 177 lb 12.8 oz (80.6 kg)  Height: 5\' 10"  (1.778 m)   Body mass index is 25.51 kg/m. Physical Exam Vitals reviewed.  Constitutional:      Appearance: Normal appearance.  HENT:     Head: Normocephalic and atraumatic.     Mouth/Throat:     Pharynx: No oropharyngeal exudate.  Eyes:     Conjunctiva/sclera: Conjunctivae normal.     Pupils: Pupils are equal, round, and reactive to light.  Cardiovascular:     Rate and Rhythm: Normal rate and regular rhythm.     Pulses: Normal pulses.     Heart sounds: Normal heart sounds.  Pulmonary:     Effort: Pulmonary effort is normal.     Breath sounds: Normal breath sounds.  Abdominal:     General: Bowel sounds are normal. There is no distension.     Palpations: Abdomen is soft.     Tenderness: There is no abdominal tenderness.     Comments: Bowel sounds active x 4  Musculoskeletal:        General: No tenderness. Normal range of motion.     Comments: Right hip surgical incision CDI, old drainage marked. Surrounding skin intact. Right lower extremity +1 edema. Dorsal pedis +2. Dorsal flexion 5/5. Anterior toes purple in color. Lower extremity warm to touch, some cascading redness present.    Skin:    General: Skin is warm and dry.     Capillary Refill: Capillary refill takes less than 2 seconds.  Neurological:     General: No focal deficit present.     Mental Status: He is alert and oriented to person, place, and time.  Psychiatric:        Mood and Affect: Mood normal.        Behavior: Behavior normal.        Thought Content: Thought content normal.        Judgment: Judgment normal.     Labs reviewed: Basic Metabolic Panel: Recent Labs    01/05/20 0911 07/08/20 0304  NA 140 136  K 4.3  4.1  CL 101 100  CO2  31 26  GLUCOSE 96 138*  BUN 27* 22  CREATININE 1.16 1.04  CALCIUM 9.2 8.4*   Liver Function Tests: No results for input(s): AST, ALT, ALKPHOS, BILITOT, PROT, ALBUMIN in the last 8760 hours. No results for input(s): LIPASE, AMYLASE in the last 8760 hours. No results for input(s): AMMONIA in the last 8760 hours. CBC: Recent Labs    07/04/20 0845 07/08/20 0304 07/10/20 0816  WBC 6.3 11.4* 7.7  HGB 15.4 11.3* 9.8*  HCT 46.8 34.1* 29.9*  MCV 91.6 92.2 92.6  PLT 194 181 158   Cardiac Enzymes: No results for input(s): CKTOTAL, CKMB, CKMBINDEX, TROPONINI in the last 8760 hours. BNP: Invalid input(s): POCBNP CBG: Recent Labs    07/08/20 0755  GLUCAP 96    Procedures and Imaging Studies During Stay: DG Pelvis Portable  Result Date: 07/07/2020 CLINICAL DATA:  Right hip arthroplasty. EXAM: PORTABLE PELVIS 1-2 VIEWS COMPARISON:  Pelvis radiograph 05/03/2020 FINDINGS: Right hip arthroplasty in expected alignment. No periprosthetic lucency or fracture. Femoral stem is midline. Pubic rami are intact. Joint space narrowing and subchondral cystic changes in the left femoral head with slight flattening, unchanged from recent imaging. Recent postsurgical change includes air and edema in the soft tissues with lateral skin staples. IMPRESSION: Right hip arthroplasty without immediate postoperative complication. Electronically Signed   By: Keith Rake M.D.   On: 07/07/2020 16:46   DG C-Arm 1-60 Min-No Report  Result Date: 07/07/2020 Fluoroscopy was utilized by the requesting physician.  No radiographic interpretation.   CT VENOGRAM ABD/PEL  Result Date: 06/20/2020 CLINICAL DATA:  History of varicose vein surgery with persistent right lower leg swelling for the past 3 months. Evaluate for central venous occlusion. EXAM: CT ABDOMEN AND PELVIS WITH CONTRAST TECHNIQUE: Multidetector CT imaging of the abdomen and pelvis was performed using the standard protocol following bolus administration  of intravenous contrast. CONTRAST:  143mL ISOVUE-370 IOPAMIDOL (ISOVUE-370) INJECTION 76% COMPARISON:  CT abdomen pelvis-05/05/2019; lumbar spine CT-01/05/2020; lumbar spine radiographs-01/11/2020 FINDINGS: Lower chest: Limited visualization of the lower thorax is negative for focal airspace opacity or pleural effusion. Normal heart size. Coronary artery calcifications. No pericardial effusion. Hepatobiliary: Normal hepatic contour. No discrete hepatic lesions. Normal appearance of the gallbladder given degree distention. No radiopaque gallstones. No intra or extrahepatic biliary ductal dilatation. No ascites. Pancreas: Normal appearance of the pancreas. Spleen: Note is again made of an approximately 1.2 cm flash filling hemangioma involving the anterior tip of the spleen (image 25, series 2, similar to abdominal CT performed 04/2019. No perisplenic stranding. Adrenals/Urinary Tract: There is symmetric enhancement and excretion of the bilateral kidneys. No renal stones on this postcontrast examination. No discrete renal lesions. No urinary obstruction or perinephric stranding. There is mild thickening of the crux of the bilateral adrenal glands without discrete nodule. The urinary bladder is underdistended. Stomach/Bowel: Rather extensive colonic diverticulosis, primarily involving the descending and sigmoid colon, without evidence of superimposed acute diverticulitis. Moderate colonic stool burden without evidence of enteric obstruction. Normal appearance of the terminal ileum and the appendix. No discrete areas of bowel wall thickening. Small hiatal hernia. No pneumoperitoneum, pneumatosis or portal venous gas. Vascular/Lymphatic: Moderate amount of mixed calcified and noncalcified atherosclerotic plaque throughout a normal caliber abdominal aorta, not definitely resulting in hemodynamically significant narrowing on this non CTA examination. The major branch vessels of the abdominal aorta appear patent on this non  CTA examination. Mild ectasia of the bilateral common iliac arteries the right measuring approximately 2.2  cm, the left measuring approximately 2.0 cm (image 58, series 2). The dominant large right anterior periarticular fluid collection results in mass effect upon the right common femoral vein however the pelvic venous system and IVC appear widely patent. No bulky retroperitoneal, mesenteric, pelvic or inguinal lymphadenopathy. Reproductive: Borderline enlarged heterogeneously enhancing prostate with mass effect on the undersurface of the urinary bladder. The prostate measures approximately 6.1 x 4.6 x 5.1 cm (axial image 84, series 2; sagittal image 99, series 8). Other: Subcutaneous edema about the midline of the low back. Musculoskeletal: Severe degenerative change of the bilateral hips with complete joint space loss bone-on-bone articulation subchondral sclerosis and cyst formation (representative coronal image 77, series 6), progressed compared to abdominal CT performed 05/04/2020. These findings are associated with development of complex adjacent fluid collections about the bilateral hips which likely communicate with the bilateral hip joint spaces with dominant collection anterior to the right hip joint space measuring at least 9.7 x 4.3 x 4.6 cm (axial image 83, series 2; coronal image 57, series 6), and dominant collection anterior to the left hip joint space measuring approximately 6.9 x 3.1 x 4.1 cm (coronal image 48, series 6; axial image 80, series 2). Note, while incompletely imaged the pelvic extension of the right-sided periarticular fluid collection was not seen on lumbar spine CT performed 12/2019. Post L2-L4 paraspinal fusion intervertebral disc space replacement without evidence of hardware failure or loosening. Stigmata of dish within the lower thoracic spine. Old/healed moderate (approximately 30%) compression deformity involving the superior endplate of L1 without associated fracture line or  paraspinal hematoma, similar to lumbar spine CT performed 01/05/2020 IMPRESSION: 1. Findings worrisome for septic arthritis involving the bilateral hips with marked progression of bilateral hip degenerative change and development of large bilateral complex fluid collections which likely communicate with the bilateral hip joint spaces. While incompletely imaged, the pelvic extension of the right-sided periarticular fluid collection was not seen lumbar spine CT performed 12/2019. Emergent orthopedic consultation is advised. 2. Dominant right-sided at least 9.7 cm periarticular fluid collection results in mass effect upon the right common femoral vein however the pelvic venous systems and IVC appear widely patent. 3. Post L2-L4 paraspinal fusion and intervertebral disc space replacement without evidence of hardware failure or loosening. 4. Moderate amount of atherosclerotic plaque within a normal caliber abdominal aorta. Aortic Atherosclerosis (ICD10-I70.0). 5. Incidentally noted fusiform ectasia of the bilateral common iliac arteries, the right measuring 2.2 cm, the left measuring 2.0 cm. 6. Coronary calcifications. Critical Value/emergent results were called by telephone at the time of interpretation on 06/20/2020 at 10:40 am to provider [facility] , who verbally acknowledged these results. Electronically Signed   By: Sandi Mariscal M.D.   On: 06/20/2020 10:53   DG HIP OPERATIVE UNILAT W OR W/O PELVIS RIGHT  Result Date: 07/07/2020 CLINICAL DATA:  Right hip replacement EXAM: OPERATIVE RIGHT HIP WITH PELVIS COMPARISON:  None. FLUOROSCOPY TIME:  Radiation Exposure Index (as provided by the fluoroscopic device): 2.09 mGy If the device does not provide the exposure index: Fluoroscopy Time:  13 seconds Number of Acquired Images:  3 FINDINGS: Right hip replacement is noted in satisfactory position. No acute soft tissue abnormality is noted. IMPRESSION: Right hip replacement Electronically Signed   By: Inez Catalina  M.D.   On: 07/07/2020 15:07    Assessment/Plan:   1. Status post total replacement of right hip - followed by Dr. Ninfa Linden, appointment today - concerned about RLE swelling, warmth and cascading redness - Doxycycline 100 mg  po bid x 14 days started by Dr. Ninfa Linden today, advised to follow up with him in 1 week - home health PT - continue oxycodone 5 mg po Q 4 hrs prn for right hip pain - continue robaxin 500 mg Q 6hrs for muscle spasms - asa 81 mg reduced to once daily by Dr. Ninfa Linden today - methocarbamol (ROBAXIN) 500 MG tablet; Take 1 tablet (500 mg total) by mouth every 6 (six) hours as needed for muscle spasms.  Dispense: 40 tablet; Refill: 0  2. Pure hypercholesterolemia - stable with statin, continue regimen - pravastatin (PRAVACHOL) 40 MG tablet; Take 1 tablet (40 mg total) by mouth daily.  Dispense: 30 tablet; Refill: 0  3. Primary hypertension - bp a goal <150/90, continue current regimen - continue to limit salt in diet - irbesartan-hydrochlorothiazide (AVALIDE) 300-12.5 MG tablet; Take 1 tablet by mouth at bedtime.  Dispense: 30 tablet; Refill: 0 - cbc/diff - bmp  4. Gastroesophageal reflux disease without esophagitis - stable without medication - continue to avoid food triggers  5. Low testosterone in male - stable with weekly injections - testosterone cypionate (DEPOTESTOSTERONE CYPIONATE) 200 MG/ML injection; Inject 0.5 mLs (100 mg total) into the muscle every Saturday.  Dispense: 10 mL; Refill: 0    Patient is being discharged with the following home health services: PT   Patient is being discharged with the following durable medical equipment: None  Patient has been advised to f/u with their PCP in 1-2 weeks to bring them up to date on their rehab stay.  Social services at facility was responsible for arranging this appointment.  Pt was provided with a 30 day supply of prescriptions for medications and refills must be obtained from their PCP.  For controlled  substances, a more limited supply may be provided adequate until PCP appointment only.  Future labs/tests needed:  Cbc/diff, bmp

## 2020-07-19 NOTE — Progress Notes (Signed)
The patient is under 2 weeks status post a right total hip arthroplasty.  He is staying in a nursing facility.  They have been concerned about right leg swelling.  He does have a history of vein surgery on that side and does get some edema.  A friend is with him said it is significantly less than what it was a few days ago.  Examination of his right hip shows the staples been removed.  He is about 12 days postop.  There is a large seroma and I did aspirate about 60 cc of fluid from his hip area.  He has been on aspirin twice a day.  We will have him go back to his just once daily baby aspirin.  Examination of his right calf shows that it is soft.  It is swollen but there is no palpable cords and no pain posteriorly at all.  There is a slight red hue so there is a little concerned about cellulitis.  I will start him on doxycycline 100 mg twice a day.  I would like to see him back in 1 week for reassessment.  Thus far, I do not think he needs a Doppler ultrasound.  I have recommended that he get back into his compressive garments when he can.  In 1 week we will see about working on scheduling out his left hip for hip replacement given the severity of his arthritis of his left hip but that is if he is making good improvements with his right hip.

## 2020-07-20 ENCOUNTER — Other Ambulatory Visit: Payer: Self-pay | Admitting: Orthopedic Surgery

## 2020-07-20 ENCOUNTER — Inpatient Hospital Stay: Payer: PPO | Admitting: Orthopaedic Surgery

## 2020-07-20 ENCOUNTER — Encounter: Payer: Self-pay | Admitting: Orthopedic Surgery

## 2020-07-20 DIAGNOSIS — L03115 Cellulitis of right lower limb: Secondary | ICD-10-CM

## 2020-07-20 MED ORDER — CEPHALEXIN 500 MG PO CAPS: 500.0000 mg | ORAL_CAPSULE | Freq: Four times a day (QID) | ORAL | 0 refills | Status: AC

## 2020-07-26 ENCOUNTER — Ambulatory Visit (INDEPENDENT_AMBULATORY_CARE_PROVIDER_SITE_OTHER): Payer: PPO | Admitting: Orthopaedic Surgery

## 2020-07-26 ENCOUNTER — Encounter: Payer: Self-pay | Admitting: Orthopaedic Surgery

## 2020-07-26 DIAGNOSIS — Z96643 Presence of artificial hip joint, bilateral: Secondary | ICD-10-CM | POA: Diagnosis not present

## 2020-07-26 DIAGNOSIS — H2513 Age-related nuclear cataract, bilateral: Secondary | ICD-10-CM | POA: Diagnosis not present

## 2020-07-26 DIAGNOSIS — Z471 Aftercare following joint replacement surgery: Secondary | ICD-10-CM | POA: Diagnosis not present

## 2020-07-26 DIAGNOSIS — K219 Gastro-esophageal reflux disease without esophagitis: Secondary | ICD-10-CM | POA: Diagnosis not present

## 2020-07-26 DIAGNOSIS — H43813 Vitreous degeneration, bilateral: Secondary | ICD-10-CM | POA: Diagnosis not present

## 2020-07-26 DIAGNOSIS — Z7982 Long term (current) use of aspirin: Secondary | ICD-10-CM | POA: Diagnosis not present

## 2020-07-26 DIAGNOSIS — M5416 Radiculopathy, lumbar region: Secondary | ICD-10-CM | POA: Diagnosis not present

## 2020-07-26 DIAGNOSIS — Z9089 Acquired absence of other organs: Secondary | ICD-10-CM | POA: Diagnosis not present

## 2020-07-26 DIAGNOSIS — G4733 Obstructive sleep apnea (adult) (pediatric): Secondary | ICD-10-CM | POA: Diagnosis not present

## 2020-07-26 DIAGNOSIS — E785 Hyperlipidemia, unspecified: Secondary | ICD-10-CM | POA: Diagnosis not present

## 2020-07-26 DIAGNOSIS — Z87891 Personal history of nicotine dependence: Secondary | ICD-10-CM | POA: Diagnosis not present

## 2020-07-26 DIAGNOSIS — Z85828 Personal history of other malignant neoplasm of skin: Secondary | ICD-10-CM | POA: Diagnosis not present

## 2020-07-26 DIAGNOSIS — Z96641 Presence of right artificial hip joint: Secondary | ICD-10-CM

## 2020-07-26 DIAGNOSIS — G4710 Hypersomnia, unspecified: Secondary | ICD-10-CM | POA: Diagnosis not present

## 2020-07-26 DIAGNOSIS — H524 Presbyopia: Secondary | ICD-10-CM | POA: Diagnosis not present

## 2020-07-26 DIAGNOSIS — M48061 Spinal stenosis, lumbar region without neurogenic claudication: Secondary | ICD-10-CM | POA: Diagnosis not present

## 2020-07-26 DIAGNOSIS — H269 Unspecified cataract: Secondary | ICD-10-CM | POA: Diagnosis not present

## 2020-07-26 NOTE — Progress Notes (Signed)
The patient comes today in continued postoperative follow-up status post a right total hip arthroplasty with on December 17.  He has had a small reaccumulation of the seroma.  Examination of his right hip shows the incision looks good.  His swelling is much less in his lower extremity.  I removed all the Steri-Strips.  I did aspirate about 30 cc of seroma from the right hip area.  He does have known severe arthritis in his left hip.  He is interested in a left hip replacement once he is rehabilitated a little bit further with the right hip.  He ambulates with a walker but sometimes uses a cane at home.  I would like to see him back in 2 weeks for repeat exam.  No x-rays are needed.  At that point we will consider working on scheduling out his left total hip arthroplasty.

## 2020-07-28 ENCOUNTER — Telehealth: Payer: Self-pay

## 2020-07-28 DIAGNOSIS — Z20822 Contact with and (suspected) exposure to covid-19: Secondary | ICD-10-CM | POA: Diagnosis not present

## 2020-07-28 DIAGNOSIS — Z03818 Encounter for observation for suspected exposure to other biological agents ruled out: Secondary | ICD-10-CM | POA: Diagnosis not present

## 2020-07-28 NOTE — Telephone Encounter (Signed)
Dorian from [facility] called he is requesting verbal orders for Pt treatment 2w2w and 1w1w. [phone] LVM if he doesn't answer

## 2020-07-28 NOTE — Telephone Encounter (Signed)
Verbal order left on VM  

## 2020-07-29 DIAGNOSIS — Z1152 Encounter for screening for COVID-19: Secondary | ICD-10-CM | POA: Diagnosis not present

## 2020-08-09 ENCOUNTER — Encounter: Payer: Self-pay | Admitting: Orthopaedic Surgery

## 2020-08-09 ENCOUNTER — Ambulatory Visit (INDEPENDENT_AMBULATORY_CARE_PROVIDER_SITE_OTHER): Payer: PPO | Admitting: Orthopaedic Surgery

## 2020-08-09 DIAGNOSIS — M1612 Unilateral primary osteoarthritis, left hip: Secondary | ICD-10-CM

## 2020-08-09 DIAGNOSIS — Z96641 Presence of right artificial hip joint: Secondary | ICD-10-CM

## 2020-08-09 NOTE — Progress Notes (Signed)
The patient is now 5 weeks status post a right total hip arthroplasty.  He says the right hip is doing wonderful for him.  He is still ambulating with a walker because he has severe end-stage arthritis of his left hip.  He is 75 years old.  He is ready for [place] to schedule his left total hip arthroplasty.  We have been preparing him for this as has he.  His left hip has significant limitations in range of motion and has significant stiffness.  There is 10 out of 10 pain when I examined that left hip.  The right hip is done significantly better.  There is still little bit of a fluid collection on the right hip and the incision looks great.  His pain is minimal with rotation.  He is only ambulating with a walker due to his left hip.  He has had no recent headache, chest pain, shortness of breath, fever, chills, nausea, vomiting.  He understands that we are delaying surgery this week and next week due to the COVID-19 pandemic.  He is someone that would need to at least stay overnight and he understands this as well.  We will work on getting him on the schedule hopefully in the near future for a left total hip arthroplasty to treat the end-stage arthritis of his left hip.  I did review his x-rays again and the left hip is significantly deformed with flattening of the femoral head and loss of the joint space which is almost complete.  There is cystic changes in the femoral head and acetabulum.

## 2020-08-12 DIAGNOSIS — Z7982 Long term (current) use of aspirin: Secondary | ICD-10-CM | POA: Diagnosis not present

## 2020-08-12 DIAGNOSIS — M48061 Spinal stenosis, lumbar region without neurogenic claudication: Secondary | ICD-10-CM | POA: Diagnosis not present

## 2020-08-12 DIAGNOSIS — G4710 Hypersomnia, unspecified: Secondary | ICD-10-CM | POA: Diagnosis not present

## 2020-08-12 DIAGNOSIS — K219 Gastro-esophageal reflux disease without esophagitis: Secondary | ICD-10-CM | POA: Diagnosis not present

## 2020-08-12 DIAGNOSIS — M5416 Radiculopathy, lumbar region: Secondary | ICD-10-CM | POA: Diagnosis not present

## 2020-08-12 DIAGNOSIS — H269 Unspecified cataract: Secondary | ICD-10-CM | POA: Diagnosis not present

## 2020-08-12 DIAGNOSIS — Z9089 Acquired absence of other organs: Secondary | ICD-10-CM | POA: Diagnosis not present

## 2020-08-12 DIAGNOSIS — Z96643 Presence of artificial hip joint, bilateral: Secondary | ICD-10-CM | POA: Diagnosis not present

## 2020-08-12 DIAGNOSIS — Z87891 Personal history of nicotine dependence: Secondary | ICD-10-CM | POA: Diagnosis not present

## 2020-08-12 DIAGNOSIS — Z471 Aftercare following joint replacement surgery: Secondary | ICD-10-CM | POA: Diagnosis not present

## 2020-08-12 DIAGNOSIS — E785 Hyperlipidemia, unspecified: Secondary | ICD-10-CM | POA: Diagnosis not present

## 2020-08-12 DIAGNOSIS — Z85828 Personal history of other malignant neoplasm of skin: Secondary | ICD-10-CM | POA: Diagnosis not present

## 2020-08-12 DIAGNOSIS — G4733 Obstructive sleep apnea (adult) (pediatric): Secondary | ICD-10-CM | POA: Diagnosis not present

## 2020-08-23 DIAGNOSIS — Z9089 Acquired absence of other organs: Secondary | ICD-10-CM | POA: Diagnosis not present

## 2020-08-23 DIAGNOSIS — K219 Gastro-esophageal reflux disease without esophagitis: Secondary | ICD-10-CM | POA: Diagnosis not present

## 2020-08-23 DIAGNOSIS — M48061 Spinal stenosis, lumbar region without neurogenic claudication: Secondary | ICD-10-CM | POA: Diagnosis not present

## 2020-08-23 DIAGNOSIS — H269 Unspecified cataract: Secondary | ICD-10-CM | POA: Diagnosis not present

## 2020-08-23 DIAGNOSIS — G4733 Obstructive sleep apnea (adult) (pediatric): Secondary | ICD-10-CM | POA: Diagnosis not present

## 2020-08-23 DIAGNOSIS — Z471 Aftercare following joint replacement surgery: Secondary | ICD-10-CM | POA: Diagnosis not present

## 2020-08-23 DIAGNOSIS — Z96643 Presence of artificial hip joint, bilateral: Secondary | ICD-10-CM | POA: Diagnosis not present

## 2020-08-23 DIAGNOSIS — Z7982 Long term (current) use of aspirin: Secondary | ICD-10-CM | POA: Diagnosis not present

## 2020-08-23 DIAGNOSIS — Z87891 Personal history of nicotine dependence: Secondary | ICD-10-CM | POA: Diagnosis not present

## 2020-08-23 DIAGNOSIS — M5416 Radiculopathy, lumbar region: Secondary | ICD-10-CM | POA: Diagnosis not present

## 2020-08-23 DIAGNOSIS — Z85828 Personal history of other malignant neoplasm of skin: Secondary | ICD-10-CM | POA: Diagnosis not present

## 2020-08-23 DIAGNOSIS — E785 Hyperlipidemia, unspecified: Secondary | ICD-10-CM | POA: Diagnosis not present

## 2020-08-23 DIAGNOSIS — G4710 Hypersomnia, unspecified: Secondary | ICD-10-CM | POA: Diagnosis not present

## 2020-09-04 DIAGNOSIS — E349 Endocrine disorder, unspecified: Secondary | ICD-10-CM | POA: Diagnosis not present

## 2020-09-04 DIAGNOSIS — R3915 Urgency of urination: Secondary | ICD-10-CM | POA: Diagnosis not present

## 2020-09-04 DIAGNOSIS — R351 Nocturia: Secondary | ICD-10-CM | POA: Diagnosis not present

## 2020-09-04 DIAGNOSIS — N3281 Overactive bladder: Secondary | ICD-10-CM | POA: Diagnosis not present

## 2020-09-04 DIAGNOSIS — R948 Abnormal results of function studies of other organs and systems: Secondary | ICD-10-CM | POA: Diagnosis not present

## 2020-09-06 DIAGNOSIS — D0461 Carcinoma in situ of skin of right upper limb, including shoulder: Secondary | ICD-10-CM | POA: Diagnosis not present

## 2020-09-06 DIAGNOSIS — Z85828 Personal history of other malignant neoplasm of skin: Secondary | ICD-10-CM | POA: Diagnosis not present

## 2020-09-06 DIAGNOSIS — L570 Actinic keratosis: Secondary | ICD-10-CM | POA: Diagnosis not present

## 2020-09-06 DIAGNOSIS — D485 Neoplasm of uncertain behavior of skin: Secondary | ICD-10-CM | POA: Diagnosis not present

## 2020-09-06 DIAGNOSIS — D225 Melanocytic nevi of trunk: Secondary | ICD-10-CM | POA: Diagnosis not present

## 2020-09-06 DIAGNOSIS — L821 Other seborrheic keratosis: Secondary | ICD-10-CM | POA: Diagnosis not present

## 2020-09-06 DIAGNOSIS — L578 Other skin changes due to chronic exposure to nonionizing radiation: Secondary | ICD-10-CM | POA: Diagnosis not present

## 2020-09-12 ENCOUNTER — Other Ambulatory Visit: Payer: Self-pay

## 2020-09-12 ENCOUNTER — Telehealth: Payer: Self-pay | Admitting: Orthopaedic Surgery

## 2020-09-12 NOTE — Telephone Encounter (Signed)
Patient called asked how long will be staying in the hospital? Patient asked if he can be released from the hospital on Sunday. Patient asked if he can come home after his two day stay in the hospital. Patient said he can stay at [facility] rehab if he have to.  Patient asked will he have to get someone to stay in his home for a couple days if he do not go to the facility?Juan Hudson  The number to contact patient is [phone]

## 2020-09-12 NOTE — Telephone Encounter (Signed)
Pt called and informed. He is wanting to go home. Juan Hudson.

## 2020-09-12 NOTE — Telephone Encounter (Signed)
Him know that he can certainly stay until Sunday.  If he has someone that will be around for a day or two,  that would be great.

## 2020-09-18 ENCOUNTER — Other Ambulatory Visit: Payer: Self-pay | Admitting: Physician Assistant

## 2020-09-20 NOTE — Progress Notes (Signed)
DUE TO COVID-19 ONLY ONE VISITOR IS ALLOWED TO COME WITH YOU AND STAY IN THE WAITING ROOM ONLY DURING PRE OP AND PROCEDURE DAY OF SURGERY. THE 1 VISITOR  MAY VISIT WITH YOU AFTER SURGERY IN YOUR PRIVATE ROOM DURING VISITING HOURS ONLY!  YOU NEED TO HAVE A COVID 19 TEST ON_3/02/2021 ______ @_______ , THIS TEST MUST BE DONE BEFORE SURGERY,  COVID TESTING SITE [address] [place] [address], IT IS ON THE RIGHT GOING OUT [address] APPROXIMATELY  2 MINUTES PAST [employer] ON THE RIGHT. ONCE YOUR COVID TEST IS COMPLETED,  PLEASE BEGIN THE QUARANTINE INSTRUCTIONS AS OUTLINED IN YOUR HANDOUT.                Juan Hudson  09/20/2020   Your procedure is scheduled on: 09/29/2020    Report to [facility] Main  Entrance   Report to admitting at   [place] AM     Call this number if you have problems the morning of surgery [phone]    REMEMBER: NO  SOLID FOOD CANDY OR GUM AFTER MIDNIGHT. CLEAR LIQUIDS UNTIL  0415AM        . NOTHING BY MOUTH EXCEPT CLEAR LIQUIDS UNTIL    . PLEASE FINISH ENSURE DRINK PER SURGEON ORDER  WHICH NEEDS TO BE COMPLETED AT 0415AM       CLEAR LIQUID DIET   Foods Allowed                                                                    Coffee and tea, regular and decaf                            Fruit ices (not with fruit pulp)                                      Iced Popsicles                                    Carbonated beverages, regular and diet                                    Cranberry, grape and apple juices Sports drinks like Gatorade Lightly seasoned clear broth or consume(fat free) Sugar, honey syrup ___________________________________________________________________      BRUSH YOUR TEETH MORNING OF SURGERY AND RINSE YOUR MOUTH OUT, NO CHEWING GUM CANDY OR MINTS.     Take these medicines the morning of surgery with A SIP OF WATER: VESICARE   DO NOT TAKE ANY DIABETIC MEDICATIONS DAY OF YOUR SURGERY                                You may not have any metal on your body including hair pins and              piercings  Do not wear jewelry, make-up, lotions, powders or  perfumes, deodorant             Do not wear nail polish on your fingernails.  Do not shave  48 hours prior to surgery.              Men may shave face and neck.   Do not bring valuables to the hospital. [place].  Contacts, dentures or bridgework may not be worn into surgery.  Leave suitcase in the car. After surgery it may be brought to your room.     Patients discharged the day of surgery will not be allowed to drive home. IF YOU ARE HAVING SURGERY AND GOING HOME THE SAME DAY, YOU MUST HAVE AN ADULT TO DRIVE YOU HOME AND BE WITH YOU FOR 24 HOURS. YOU MAY GO HOME BY TAXI OR UBER OR ORTHERWISE, BUT AN ADULT MUST ACCOMPANY YOU HOME AND STAY WITH YOU FOR 24 HOURS.  Name and phone number of your driver:  Special Instructions: N/A              Please read over the following fact sheets you were given: _____________________________________________________________________  [facility] - Preparing for Surgery Before surgery, you can play an important role.  Because skin is not sterile, your skin needs to be as free of germs as possible.  You can reduce the number of germs on your skin by washing with CHG (chlorahexidine gluconate) soap before surgery.  CHG is an antiseptic cleaner which kills germs and bonds with the skin to continue killing germs even after washing. Please DO NOT use if you have an allergy to CHG or antibacterial soaps.  If your skin becomes reddened/irritated stop using the CHG and inform your nurse when you arrive at Short Stay. Do not shave (including legs and underarms) for at least 48 hours prior to the first CHG shower.  You may shave your face/neck. Please follow these instructions carefully:  1.  Shower with CHG Soap the night before surgery and the  morning of Surgery.  2.   If you choose to wash your hair, wash your hair first as usual with your  normal  shampoo.  3.  After you shampoo, rinse your hair and body thoroughly to remove the  shampoo.                           4.  Use CHG as you would any other liquid soap.  You can apply chg directly  to the skin and wash                       Gently with a scrungie or clean washcloth.  5.  Apply the CHG Soap to your body ONLY FROM THE NECK DOWN.   Do not use on face/ open                           Wound or open sores. Avoid contact with eyes, ears mouth and genitals (private parts).                       Wash face,  Genitals (private parts) with your normal soap.             6.  Wash thoroughly, paying special attention to the area where  your surgery  will be performed.  7.  Thoroughly rinse your body with warm water from the neck down.  8.  DO NOT shower/wash with your normal soap after using and rinsing off  the CHG Soap.                9.  Pat yourself dry with a clean towel.            10.  Wear clean pajamas.            11.  Place clean sheets on your bed the night of your first shower and do not  sleep with pets. Day of Surgery : Do not apply any lotions/deodorants the morning of surgery.  Please wear clean clothes to the hospital/surgery center.  FAILURE TO FOLLOW THESE INSTRUCTIONS MAY RESULT IN THE CANCELLATION OF YOUR SURGERY PATIENT SIGNATURE_________________________________  NURSE SIGNATURE__________________________________  ________________________________________________________________________

## 2020-09-25 ENCOUNTER — Other Ambulatory Visit: Payer: Self-pay

## 2020-09-25 ENCOUNTER — Encounter (HOSPITAL_COMMUNITY)
Admission: RE | Admit: 2020-09-25 | Discharge: 2020-09-25 | Disposition: A | Discharge status: Home / Self Care | Payer: PPO | Source: Ambulatory Visit | Attending: Orthopaedic Surgery | Admitting: Orthopaedic Surgery

## 2020-09-25 ENCOUNTER — Encounter (HOSPITAL_COMMUNITY): Payer: Self-pay

## 2020-09-25 DIAGNOSIS — M1612 Unilateral primary osteoarthritis, left hip: Secondary | ICD-10-CM | POA: Insufficient documentation

## 2020-09-25 DIAGNOSIS — Z79899 Other long term (current) drug therapy: Secondary | ICD-10-CM | POA: Insufficient documentation

## 2020-09-25 DIAGNOSIS — Z79891 Long term (current) use of opiate analgesic: Secondary | ICD-10-CM | POA: Diagnosis not present

## 2020-09-25 DIAGNOSIS — I350 Nonrheumatic aortic (valve) stenosis: Secondary | ICD-10-CM | POA: Diagnosis not present

## 2020-09-25 DIAGNOSIS — Z7982 Long term (current) use of aspirin: Secondary | ICD-10-CM | POA: Insufficient documentation

## 2020-09-25 DIAGNOSIS — I10 Essential (primary) hypertension: Secondary | ICD-10-CM | POA: Diagnosis not present

## 2020-09-25 DIAGNOSIS — Z981 Arthrodesis status: Secondary | ICD-10-CM | POA: Diagnosis not present

## 2020-09-25 DIAGNOSIS — Z01812 Encounter for preprocedural laboratory examination: Secondary | ICD-10-CM | POA: Insufficient documentation

## 2020-09-25 DIAGNOSIS — G4730 Sleep apnea, unspecified: Secondary | ICD-10-CM | POA: Diagnosis not present

## 2020-09-25 DIAGNOSIS — Z79890 Hormone replacement therapy (postmenopausal): Secondary | ICD-10-CM | POA: Insufficient documentation

## 2020-09-25 HISTORY — DX: Nausea with vomiting, unspecified: R11.2

## 2020-09-25 HISTORY — DX: Unspecified osteoarthritis, unspecified site: M19.90

## 2020-09-25 HISTORY — DX: Personal history of urinary calculi: Z87.442

## 2020-09-25 HISTORY — DX: Other specified postprocedural states: Z98.890

## 2020-09-25 LAB — CBC
HCT: 43.4 % (ref 39.0–52.0)
Hemoglobin: 13.8 g/dL (ref 13.0–17.0)
MCH: 28.5 pg (ref 26.0–34.0)
MCHC: 31.8 g/dL (ref 30.0–36.0)
MCV: 89.5 fL (ref 80.0–100.0)
Platelets: 213 10*3/uL (ref 150–400)
RBC: 4.85 MIL/uL (ref 4.22–5.81)
RDW: 15.4 % (ref 11.5–15.5)
WBC: 6.6 10*3/uL (ref 4.0–10.5)
nRBC: 0 % (ref 0.0–0.2)

## 2020-09-25 LAB — BASIC METABOLIC PANEL WITH GFR
Anion gap: 6 (ref 5–15)
BUN: 22 mg/dL (ref 8–23)
CO2: 28 mmol/L (ref 22–32)
Calcium: 9.2 mg/dL (ref 8.9–10.3)
Chloride: 107 mmol/L (ref 98–111)
Creatinine, Ser: 0.96 mg/dL (ref 0.61–1.24)
GFR, Estimated: >60 mL/min
Glucose, Bld: 132 mg/dL — ABNORMAL HIGH (ref 70–99)
Potassium: 4 mmol/L (ref 3.5–5.1)
Sodium: 141 mmol/L (ref 135–145)

## 2020-09-25 LAB — SURGICAL PCR SCREEN
MRSA, PCR: NEGATIVE
Staphylococcus aureus: NEGATIVE

## 2020-09-25 NOTE — Progress Notes (Addendum)
Anesthesia Review:  PCP: DR Crist Infante  Requested LOV note on 09/25/20  Note on chart from 05/22/2020.   Cardiologist : none  Chest x-ray : EKG : 01/05/2020  Echo : 2020  Stress test: Cardiac Cath :  Activity level: can do a flight of stairs without difficulty  Sleep Study/ CPAP : yes  Fasting Blood Sugar :      / Checks Blood Sugar -- times a day:   Blood Thinner/ Instructions /Last Dose: ASA / Instructions/ Last Dose :  covid positive on 07/29/2020 results on chart

## 2020-09-25 NOTE — Progress Notes (Signed)
  covid positve on 07/29/2020.  Results placed on front of chart.

## 2020-09-26 NOTE — Progress Notes (Signed)
Anesthesia Chart Review   Case: [id number] Date/Time: 09/29/20 0700   Procedure: LEFT TOTAL HIP ARTHROPLASTY ANTERIOR APPROACH (Left Hip)   Anesthesia type: Spinal   Pre-op diagnosis: osteoarthritis left hip   Location: [facility] ROOM 09 / [facility]   Surgeons: Mcarthur Rossetti, MD      DISCUSSION:75 y.o. former smoker with h/o PONV, HTN, sleep apnea, mild AS, left hip OA scheduled for above procedure 09/29/20 with Dr. Jean Rosenthal.   Echo 12/25/18: EF 60-65%, mild AI, mild AS (MG 9, AVA 1.33).   Lumbar fusion L2-L4 with hardware in place.   VS: BP 139/69   Pulse 98   Temp 36.7 C (Oral)   Resp 16   SpO2 98%   PROVIDERS: Crist Infante, MD is PCP    LABS: Labs reviewed: Acceptable for surgery. (all labs ordered are listed, but only abnormal results are displayed)  Labs Reviewed  BASIC METABOLIC PANEL - Abnormal; Notable for the following components:      Result Value   Glucose, Bld 132 (*)    All other components within normal limits  SURGICAL PCR SCREEN  CBC  TYPE AND SCREEN     IMAGES:   EKG: 01/05/2020 Rate 90 bpm Normal sinus rhythm Left axis deviation Left anterior fasicular block Incomplete right bundle branch block Abnormal ECG No significant change since 06/16/2006  CV: Echo 12/25/2018 IMPRESSIONS    1. The left ventricle has normal systolic function with an ejection  fraction of 60-65%. The cavity size was normal. Left ventricular diastolic  Doppler parameters are consistent with impaired relaxation.  2. The right ventricle has normal systolic function. The cavity was  mildly enlarged. There is no increase in right ventricular wall thickness.  3. The aortic valve is tricuspid. Mild thickening of the aortic valve.  Mild calcification of the aortic valve. Aortic valve regurgitation is mild  by color flow Doppler. Mild stenosis of the aortic valve.  Past Medical History:  Diagnosis Date  . Allergy   . Arthritis   . Cancer ([place])    skin cancer   . Cataract    early  . History of kidney stones   . HLD (hyperlipidemia)   . Hypertension   . PONV (postoperative nausea and vomiting)   . Rosacea   . Sleep apnea    cpap  . Systolic murmur     Past Surgical History:  Procedure Laterality Date  . ABDOMINAL HERNIA REPAIR     x2  . ANTERIOR LAT LUMBAR FUSION N/A 01/11/2020   Procedure: Lumbar Two-three Lumbar Three-Four Anterolateral decompression/fusion;  Surgeon: Kristeen Miss, MD;  Location: [place];  Service: Neurosurgery;  Laterality: N/A;  anterolateral  . APPLICATION OF ROBOTIC ASSISTANCE FOR SPINAL PROCEDURE N/A 01/11/2020   Procedure: APPLICATION OF ROBOTIC ASSISTANCE FOR SPINAL PROCEDURE;  Surgeon: Kristeen Miss, MD;  Location: [place];  Service: Neurosurgery;  Laterality: N/A;  posterior  . COLONOSCOPY  04-01-2003   tics and hems  . HEMORRHOID SURGERY    . INGUINAL HERNIA REPAIR    . LUMBAR PERCUTANEOUS PEDICLE SCREW 2 LEVEL N/A 01/11/2020   Procedure: Percutaneous pedicle screw fixation from Lumbar Two to Lumbar Four;  Surgeon: Kristeen Miss, MD;  Location: [place];  Service: Neurosurgery;  Laterality: N/A;  posterior  . NOSE SURGERY    . TONSILLECTOMY    . TOTAL HIP ARTHROPLASTY Right 07/07/2020   Procedure: RIGHT TOTAL HIP ARTHROPLASTY ANTERIOR APPROACH;  Surgeon: Mcarthur Rossetti, MD;  [facility];  Service: Orthopedics;  Laterality: Right;  . uvuloplasty      MEDICATIONS: . acetaminophen (TYLENOL) 500 MG tablet  . ascorbic acid (VITAMIN C) 500 MG tablet  . aspirin 81 MG chewable tablet  . B Complex Vitamins (B COMPLEX 100 PO)  . calcium carbonate (TUMS EX) 750 MG chewable tablet  . Cholecalciferol (VITAMIN D) 125 MCG (5000 UT) CAPS  . Coenzyme Q10 (COQ10) 200 MG CAPS  . irbesartan-hydrochlorothiazide (AVALIDE) 300-12.5 MG tablet  . methocarbamol (ROBAXIN) 500 MG tablet  . Multiple Vitamins-Minerals (CENTRUM) tablet  . naproxen sodium (ALEVE) 220 MG tablet  . Omega-3 Fatty Acids (FISH OIL) 1000 MG CAPS   . oxyCODONE (OXY IR/ROXICODONE) 5 MG immediate release tablet  . pravastatin (PRAVACHOL) 40 MG tablet  . solifenacin (VESICARE) 10 MG tablet  . testosterone cypionate (DEPOTESTOSTERONE CYPIONATE) 200 MG/ML injection  . Zinc 25 MG TABS   No current facility-administered medications for this encounter.     Konrad Felix, PA-C [facility] Pre-Surgical Testing [phone]

## 2020-09-28 NOTE — H&P (Signed)
TOTAL HIP ADMISSION H&P  Patient is admitted for left total hip arthroplasty.  Subjective:  Chief Complaint: left hip pain  HPI: Juan Hudson, 75 y.o. male, has a history of pain and functional disability in the left hip(s) due to arthritis and patient has failed non-surgical conservative treatments for greater than 12 weeks to include NSAID's and/or analgesics, corticosteriod injections, flexibility and strengthening excercises, supervised PT with diminished ADL's post treatment, use of assistive devices and activity modification.  Onset of symptoms was gradual starting 3 years ago with gradually worsening course since that time.The patient noted no past surgery on the left hip(s).  Patient currently rates pain in the left hip at 10 out of 10 with activity. Patient has night pain, worsening of pain with activity and weight bearing, pain that interfers with activities of daily living and pain with passive range of motion. Patient has evidence of subchondral sclerosis, periarticular osteophytes and joint space narrowing by imaging studies. This condition presents safety issues increasing the risk of falls.  There is no current active infection.  Patient Active Problem List   Diagnosis Date Noted  . Status post total replacement of right hip 07/08/2020  . Status post total replacement of left hip 07/07/2020  . Unilateral primary osteoarthritis, left hip 05/15/2020  . Unilateral primary osteoarthritis, right hip 05/15/2020  . Lumbar stenosis with neurogenic claudication 01/11/2020  . Subacute bronchitis 02/23/2019  . Left lumbar radiculopathy 04/08/2017  . Heart murmur, systolic 64/33/2951  . Hypersomnia 10/12/2015  . Obstructive sleep apnea 02/15/2011  . Foot pain 01/16/2011  . Metatarsalgia of right foot 11/15/2010  . Loss of transverse plantar arch 11/15/2010   Past Medical History:  Diagnosis Date  . Allergy   . Arthritis   . Cancer ([place])    skin cancer  . Cataract    early  .  History of kidney stones   . HLD (hyperlipidemia)   . Hypertension   . PONV (postoperative nausea and vomiting)   . Rosacea   . Sleep apnea    cpap  . Systolic murmur     Past Surgical History:  Procedure Laterality Date  . ABDOMINAL HERNIA REPAIR     x2  . ANTERIOR LAT LUMBAR FUSION N/A 01/11/2020   Procedure: Lumbar Two-three Lumbar Three-Four Anterolateral decompression/fusion;  Surgeon: Kristeen Miss, MD;  Location: [place];  Service: Neurosurgery;  Laterality: N/A;  anterolateral  . APPLICATION OF ROBOTIC ASSISTANCE FOR SPINAL PROCEDURE N/A 01/11/2020   Procedure: APPLICATION OF ROBOTIC ASSISTANCE FOR SPINAL PROCEDURE;  Surgeon: Kristeen Miss, MD;  Location: [place];  Service: Neurosurgery;  Laterality: N/A;  posterior  . COLONOSCOPY  04-01-2003   tics and hems  . HEMORRHOID SURGERY    . INGUINAL HERNIA REPAIR    . LUMBAR PERCUTANEOUS PEDICLE SCREW 2 LEVEL N/A 01/11/2020   Procedure: Percutaneous pedicle screw fixation from Lumbar Two to Lumbar Four;  Surgeon: Kristeen Miss, MD;  Location: [place];  Service: Neurosurgery;  Laterality: N/A;  posterior  . NOSE SURGERY    . TONSILLECTOMY    . TOTAL HIP ARTHROPLASTY Right 07/07/2020   Procedure: RIGHT TOTAL HIP ARTHROPLASTY ANTERIOR APPROACH;  Surgeon: Mcarthur Rossetti, MD;  [facility];  Service: Orthopedics;  Laterality: Right;  . uvuloplasty      No current facility-administered medications for this encounter.   Current Outpatient Medications  Medication Sig Dispense Refill Last Dose  . acetaminophen (TYLENOL) 500 MG tablet Take 1,000 mg by mouth daily as needed for moderate pain.     Marland Kitchen  ascorbic acid (VITAMIN C) 500 MG tablet Take 500 mg by mouth daily.     Marland Kitchen aspirin 81 MG chewable tablet Chew 1 tablet (81 mg total) by mouth daily. 30 tablet 0   . B Complex Vitamins (B COMPLEX 100 PO) Take 1 tablet by mouth daily.     . calcium carbonate (TUMS EX) 750 MG chewable tablet Chew 375 mg by mouth daily as needed for heartburn.      . Cholecalciferol (VITAMIN D) 125 MCG (5000 UT) CAPS Take 5,000 Units by mouth daily.     . Coenzyme Q10 (COQ10) 200 MG CAPS Take 200 mg by mouth daily.     . irbesartan-hydrochlorothiazide (AVALIDE) 300-12.5 MG tablet Take 1 tablet by mouth at bedtime. 30 tablet 0   . methocarbamol (ROBAXIN) 500 MG tablet Take 1 tablet (500 mg total) by mouth every 6 (six) hours as needed for muscle spasms. 40 tablet 0   . Multiple Vitamins-Minerals (CENTRUM) tablet Take 1 tablet by mouth daily.      . naproxen sodium (ALEVE) 220 MG tablet Take 220 mg by mouth daily as needed (pain).     . Omega-3 Fatty Acids (FISH OIL) 1000 MG CAPS Take 1,000 mg by mouth daily.      . pravastatin (PRAVACHOL) 40 MG tablet Take 1 tablet (40 mg total) by mouth daily. 30 tablet 0   . solifenacin (VESICARE) 10 MG tablet Take 10 mg by mouth daily.     Marland Kitchen testosterone cypionate (DEPOTESTOSTERONE CYPIONATE) 200 MG/ML injection Inject 0.5 mLs (100 mg total) into the muscle every Saturday. (Patient taking differently: Inject 60 mg into the muscle every Saturday.) 10 mL 0   . Zinc 25 MG TABS Take 25 mg by mouth daily.     Marland Kitchen oxyCODONE (OXY IR/ROXICODONE) 5 MG immediate release tablet Take 1 tablet (5 mg total) by mouth every 4 (four) hours as needed for moderate pain (pain score 4-6). (Patient not taking: No sig reported) 30 tablet 0 Completed Course at Unknown time   Allergies  Allergen Reactions  . Codeine Nausea Only    Tolerates oxycodone   . Doxycycline Rash    Rash on upper extremities after one dose    Social History   Tobacco Use  . Smoking status: Former Smoker    Types: Cigarettes    Quit date: 07/22/1964    Years since quitting: 56.2  . Smokeless tobacco: Never Used  . Tobacco comment: 02/23/2019 reports smoking socially in high school  Substance Use Topics  . Alcohol use: Yes    Alcohol/week: 4.0 standard drinks    Types: 4 Standard drinks or equivalent per week    Comment: occasionally    Family History  Problem  Relation Age of Onset  . Heart attack Father        pacemaker  . Dementia Mother   . Colon cancer Neg Hx   . Rectal cancer Neg Hx   . Stomach cancer Neg Hx      Review of Systems  Musculoskeletal: Positive for gait problem.  All other systems reviewed and are negative.   Objective:  Physical Exam Vitals reviewed.  Constitutional:      Appearance: Normal appearance.  HENT:     Head: Normocephalic and atraumatic.  Eyes:     Extraocular Movements: Extraocular movements intact.     Pupils: Pupils are equal, round, and reactive to light.  Cardiovascular:     Rate and Rhythm: Normal rate.     Pulses: Normal  pulses.  Pulmonary:     Breath sounds: Normal breath sounds.  Abdominal:     Palpations: Abdomen is soft.  Musculoskeletal:     Cervical back: Normal range of motion.     Left hip: Tenderness and bony tenderness present. Decreased range of motion. Decreased strength.  Neurological:     Mental Status: He is alert and oriented to person, place, and time.  Psychiatric:        Behavior: Behavior normal.     Vital signs in last 24 hours:    Labs:   Estimated body mass index is 25.51 kg/m as calculated from the following:   Height as of 07/19/20: 5\' 10"  (1.778 m).   Weight as of 07/19/20: 80.6 kg.   Imaging Review Plain radiographs demonstrate severe degenerative joint disease of the left hip(s). The bone quality appears to be good for age and reported activity level.      Assessment/Plan:  End stage arthritis, left hip(s)  The patient history, physical examination, clinical judgement of the provider and imaging studies are consistent with end stage degenerative joint disease of the left hip(s) and total hip arthroplasty is deemed medically necessary. The treatment options including medical management, injection therapy, arthroscopy and arthroplasty were discussed at length. The risks and benefits of total hip arthroplasty were presented and reviewed. The risks  due to aseptic loosening, infection, stiffness, dislocation/subluxation,  thromboembolic complications and other imponderables were discussed.  The patient acknowledged the explanation, agreed to proceed with the plan and consent was signed. Patient is being admitted for inpatient treatment for surgery, pain control, PT, OT, prophylactic antibiotics, VTE prophylaxis, progressive ambulation and ADL's and discharge planning.The patient is planning to be discharged home with home health services vs short-term skilled nursing.

## 2020-09-28 NOTE — Anesthesia Preprocedure Evaluation (Addendum)
Anesthesia Evaluation  Patient identified by MRN, date of birth, ID band Patient awake    Reviewed: Allergy & Precautions, NPO status , Patient's Chart, lab work & pertinent test results  History of Anesthesia Complications (+) PONV and history of anesthetic complications  Airway Mallampati: II  TM Distance: >3 FB Neck ROM: Full    Dental  (+) Dental Advisory Given, Teeth Intact   Pulmonary sleep apnea , former smoker,    Pulmonary exam normal breath sounds clear to auscultation       Cardiovascular hypertension, Pt. on medications Normal cardiovascular exam+ Valvular Problems/Murmurs AS and AI  Rhythm:Regular Rate:Normal  Echo 12/2018 1. The left ventricle has normal systolic function with an ejection fraction of 60-65%. The cavity size was normal. Left ventricular diastolic Doppler parameters are consistent with impaired relaxation.  2. The right ventricle has normal systolic function. The cavity was mildly enlarged. There is no increase in right ventricular wall thickness.  3. The aortic valve is tricuspid. Mild thickening of the aortic valve. Mild calcification of the aortic valve. Aortic valve regurgitation is mild by color flow Doppler. Mild stenosis of the aortic valve.    Neuro/Psych  Neuromuscular disease    GI/Hepatic negative GI ROS, Neg liver ROS,   Endo/Other  negative endocrine ROS  Renal/GU negative Renal ROS     Musculoskeletal  (+) Arthritis ,   Abdominal   Peds  Hematology negative hematology ROS (+)   Anesthesia Other Findings   Reproductive/Obstetrics                           Anesthesia Physical Anesthesia Plan  ASA: III  Anesthesia Plan: Spinal   Post-op Pain Management:    Induction: Intravenous  PONV Risk Score and Plan: 3 and Propofol infusion, Treatment may vary due to age or medical condition, Ondansetron and Dexamethasone  Airway Management Planned:  Natural Airway  Additional Equipment: None  Intra-op Plan:   Post-operative Plan:   Informed Consent: I have reviewed the patients History and Physical, chart, labs and discussed the procedure including the risks, benefits and alternatives for the proposed anesthesia with the patient or authorized representative who has indicated his/her understanding and acceptance.     Dental advisory given  Plan Discussed with: CRNA  Anesthesia Plan Comments:       Anesthesia Quick Evaluation

## 2020-09-29 ENCOUNTER — Encounter (HOSPITAL_COMMUNITY)
Admission: AD | Disposition: A | Discharge status: Home Health | Payer: Self-pay | Source: Home / Self Care | Attending: Orthopaedic Surgery

## 2020-09-29 ENCOUNTER — Other Ambulatory Visit: Payer: Self-pay

## 2020-09-29 ENCOUNTER — Inpatient Hospital Stay (HOSPITAL_COMMUNITY)
Admission: AD | Admit: 2020-09-29 | Discharge: 2020-10-04 | DRG: 470 | Disposition: A | Discharge status: Home Health | Payer: PPO | Attending: Orthopaedic Surgery | Admitting: Orthopaedic Surgery

## 2020-09-29 ENCOUNTER — Ambulatory Visit (HOSPITAL_COMMUNITY): Payer: PPO

## 2020-09-29 ENCOUNTER — Ambulatory Visit (HOSPITAL_COMMUNITY): Payer: PPO | Admitting: Physician Assistant

## 2020-09-29 ENCOUNTER — Observation Stay (HOSPITAL_COMMUNITY): Payer: PPO

## 2020-09-29 ENCOUNTER — Encounter (HOSPITAL_COMMUNITY): Payer: Self-pay | Admitting: Orthopaedic Surgery

## 2020-09-29 ENCOUNTER — Ambulatory Visit (HOSPITAL_COMMUNITY): Payer: PPO | Admitting: Anesthesiology

## 2020-09-29 DIAGNOSIS — R011 Cardiac murmur, unspecified: Secondary | ICD-10-CM | POA: Diagnosis present

## 2020-09-29 DIAGNOSIS — R4182 Altered mental status, unspecified: Secondary | ICD-10-CM | POA: Diagnosis present

## 2020-09-29 DIAGNOSIS — Z79899 Other long term (current) drug therapy: Secondary | ICD-10-CM | POA: Diagnosis not present

## 2020-09-29 DIAGNOSIS — Z96642 Presence of left artificial hip joint: Secondary | ICD-10-CM | POA: Diagnosis not present

## 2020-09-29 DIAGNOSIS — Z7982 Long term (current) use of aspirin: Secondary | ICD-10-CM | POA: Diagnosis not present

## 2020-09-29 DIAGNOSIS — M48062 Spinal stenosis, lumbar region with neurogenic claudication: Secondary | ICD-10-CM | POA: Diagnosis not present

## 2020-09-29 DIAGNOSIS — M5416 Radiculopathy, lumbar region: Secondary | ICD-10-CM | POA: Diagnosis not present

## 2020-09-29 DIAGNOSIS — I10 Essential (primary) hypertension: Secondary | ICD-10-CM | POA: Diagnosis present

## 2020-09-29 DIAGNOSIS — Z79890 Hormone replacement therapy (postmenopausal): Secondary | ICD-10-CM

## 2020-09-29 DIAGNOSIS — Z96641 Presence of right artificial hip joint: Secondary | ICD-10-CM | POA: Diagnosis present

## 2020-09-29 DIAGNOSIS — Z87891 Personal history of nicotine dependence: Secondary | ICD-10-CM

## 2020-09-29 DIAGNOSIS — E785 Hyperlipidemia, unspecified: Secondary | ICD-10-CM | POA: Diagnosis present

## 2020-09-29 DIAGNOSIS — Z881 Allergy status to other antibiotic agents status: Secondary | ICD-10-CM

## 2020-09-29 DIAGNOSIS — M1612 Unilateral primary osteoarthritis, left hip: Principal | ICD-10-CM | POA: Diagnosis present

## 2020-09-29 DIAGNOSIS — Z8249 Family history of ischemic heart disease and other diseases of the circulatory system: Secondary | ICD-10-CM

## 2020-09-29 DIAGNOSIS — Z602 Problems related to living alone: Secondary | ICD-10-CM | POA: Diagnosis present

## 2020-09-29 DIAGNOSIS — K219 Gastro-esophageal reflux disease without esophagitis: Secondary | ICD-10-CM | POA: Diagnosis present

## 2020-09-29 DIAGNOSIS — G4733 Obstructive sleep apnea (adult) (pediatric): Secondary | ICD-10-CM | POA: Diagnosis present

## 2020-09-29 DIAGNOSIS — N3281 Overactive bladder: Secondary | ICD-10-CM | POA: Diagnosis present

## 2020-09-29 DIAGNOSIS — M160 Bilateral primary osteoarthritis of hip: Secondary | ICD-10-CM | POA: Diagnosis present

## 2020-09-29 DIAGNOSIS — Z981 Arthrodesis status: Secondary | ICD-10-CM | POA: Diagnosis not present

## 2020-09-29 DIAGNOSIS — M1712 Unilateral primary osteoarthritis, left knee: Secondary | ICD-10-CM | POA: Diagnosis not present

## 2020-09-29 DIAGNOSIS — Z471 Aftercare following joint replacement surgery: Secondary | ICD-10-CM | POA: Diagnosis not present

## 2020-09-29 DIAGNOSIS — Z419 Encounter for procedure for purposes other than remedying health state, unspecified: Secondary | ICD-10-CM

## 2020-09-29 HISTORY — PX: TOTAL HIP ARTHROPLASTY: SHX124

## 2020-09-29 LAB — TYPE AND SCREEN
ABO/RH(D): O POS
Antibody Screen: NEGATIVE

## 2020-09-29 SURGERY — ARTHROPLASTY, HIP, TOTAL, ANTERIOR APPROACH
Anesthesia: Spinal | Site: Hip | Laterality: Left

## 2020-09-29 MED ORDER — TRANEXAMIC ACID-NACL 1000-0.7 MG/100ML-% IV SOLN
INTRAVENOUS | Status: AC
  Filled 2020-09-29: qty 100

## 2020-09-29 MED ORDER — ASPIRIN 81 MG PO CHEW
81.0000 mg | CHEWABLE_TABLET | Freq: Two times a day (BID) | ORAL | Status: DC
  Administered 2020-09-29 – 2020-10-04 (×10): 81 mg via ORAL
  Filled 2020-09-29 (×10): qty 1

## 2020-09-29 MED ORDER — PROPOFOL 1000 MG/100ML IV EMUL
INTRAVENOUS | Status: AC
  Filled 2020-09-29: qty 100

## 2020-09-29 MED ORDER — ONDANSETRON HCL 4 MG PO TABS: 4.0000 mg | ORAL_TABLET | Freq: Four times a day (QID) | ORAL | Status: DC | PRN

## 2020-09-29 MED ORDER — DIPHENHYDRAMINE HCL 12.5 MG/5ML PO ELIX: 12.5000 mg | ORAL_SOLUTION | ORAL | Status: DC | PRN

## 2020-09-29 MED ORDER — STERILE WATER FOR IRRIGATION IR SOLN
Status: DC | PRN
  Administered 2020-09-29: 2000 mL

## 2020-09-29 MED ORDER — MIDAZOLAM HCL 2 MG/2ML IJ SOLN
INTRAMUSCULAR | Status: AC
  Filled 2020-09-29: qty 2

## 2020-09-29 MED ORDER — CEFAZOLIN SODIUM-DEXTROSE 2-4 GM/100ML-% IV SOLN
2.0000 g | INTRAVENOUS | Status: AC
  Administered 2020-09-29: 2 g via INTRAVENOUS

## 2020-09-29 MED ORDER — IRBESARTAN 150 MG PO TABS
300.0000 mg | ORAL_TABLET | Freq: Every day | ORAL | Status: DC
  Administered 2020-09-29: 300 mg via ORAL
  Filled 2020-09-29 (×2): qty 2

## 2020-09-29 MED ORDER — DARIFENACIN HYDROBROMIDE ER 15 MG PO TB24
15.0000 mg | ORAL_TABLET | Freq: Every day | ORAL | Status: DC
  Administered 2020-09-29 – 2020-10-04 (×6): 15 mg via ORAL
  Filled 2020-09-29 (×6): qty 1

## 2020-09-29 MED ORDER — 0.9 % SODIUM CHLORIDE (POUR BTL) OPTIME
TOPICAL | Status: DC | PRN
  Administered 2020-09-29: 1000 mL

## 2020-09-29 MED ORDER — ASCORBIC ACID 500 MG PO TABS
500.0000 mg | ORAL_TABLET | Freq: Every day | ORAL | Status: DC
Start: 2020-09-29 — End: 2020-10-04
  Administered 2020-09-30 – 2020-10-04 (×5): 500 mg via ORAL
  Filled 2020-09-29 (×6): qty 1

## 2020-09-29 MED ORDER — PANTOPRAZOLE SODIUM 40 MG PO TBEC
40.0000 mg | DELAYED_RELEASE_TABLET | Freq: Every day | ORAL | Status: DC
  Administered 2020-09-29 – 2020-10-04 (×6): 40 mg via ORAL
  Filled 2020-09-29 (×6): qty 1

## 2020-09-29 MED ORDER — ZINC SULFATE 220 (50 ZN) MG PO CAPS
220.0000 mg | ORAL_CAPSULE | Freq: Every day | ORAL | Status: DC
  Administered 2020-09-30 – 2020-10-04 (×5): 220 mg via ORAL
  Filled 2020-09-29 (×6): qty 1

## 2020-09-29 MED ORDER — MIDAZOLAM HCL 5 MG/5ML IJ SOLN
INTRAMUSCULAR | Status: DC | PRN
  Administered 2020-09-29: 1 mg via INTRAVENOUS

## 2020-09-29 MED ORDER — HYDROCHLOROTHIAZIDE 12.5 MG PO CAPS
12.5000 mg | ORAL_CAPSULE | Freq: Every day | ORAL | Status: DC
  Administered 2020-09-29: 12.5 mg via ORAL
  Filled 2020-09-29 (×2): qty 1

## 2020-09-29 MED ORDER — METHOCARBAMOL 500 MG PO TABS
500.0000 mg | ORAL_TABLET | Freq: Four times a day (QID) | ORAL | Status: DC | PRN
  Administered 2020-09-29 – 2020-10-04 (×8): 500 mg via ORAL
  Filled 2020-09-29 (×10): qty 1

## 2020-09-29 MED ORDER — CEFAZOLIN SODIUM-DEXTROSE 2-4 GM/100ML-% IV SOLN
INTRAVENOUS | Status: AC
  Filled 2020-09-29: qty 100

## 2020-09-29 MED ORDER — ONDANSETRON HCL 4 MG/2ML IJ SOLN
INTRAMUSCULAR | Status: DC | PRN
  Administered 2020-09-29: 4 mg via INTRAVENOUS

## 2020-09-29 MED ORDER — EPHEDRINE SULFATE-NACL 50-0.9 MG/10ML-% IV SOSY
PREFILLED_SYRINGE | INTRAVENOUS | Status: DC | PRN
  Administered 2020-09-29 (×2): 10 mg via INTRAVENOUS
  Administered 2020-09-29 (×2): 5 mg via INTRAVENOUS

## 2020-09-29 MED ORDER — HYDROMORPHONE HCL 1 MG/ML IJ SOLN
0.5000 mg | INTRAMUSCULAR | Status: DC | PRN
  Administered 2020-09-29 – 2020-09-30 (×2): 1 mg via INTRAVENOUS
  Filled 2020-09-29 (×2): qty 1

## 2020-09-29 MED ORDER — PHENOL 1.4 % MT LIQD: 1.0000 | OROMUCOSAL | Status: DC | PRN

## 2020-09-29 MED ORDER — OXYCODONE HCL 5 MG PO TABS
5.0000 mg | ORAL_TABLET | ORAL | Status: DC | PRN
  Administered 2020-09-30 (×2): 10 mg via ORAL
  Filled 2020-09-29 (×2): qty 2

## 2020-09-29 MED ORDER — COQ10 200 MG PO CAPS: 200.0000 mg | ORAL_CAPSULE | Freq: Every day | ORAL | Status: DC

## 2020-09-29 MED ORDER — OXYCODONE HCL 5 MG PO TABS
10.0000 mg | ORAL_TABLET | ORAL | Status: DC | PRN
Start: 2020-09-29 — End: 2020-10-04
  Administered 2020-09-29 (×2): 15 mg via ORAL
  Filled 2020-09-29 (×2): qty 3

## 2020-09-29 MED ORDER — METOCLOPRAMIDE HCL 5 MG/ML IJ SOLN: 5.0000 mg | Freq: Three times a day (TID) | INTRAMUSCULAR | Status: DC | PRN

## 2020-09-29 MED ORDER — ALUM & MAG HYDROXIDE-SIMETH 200-200-20 MG/5ML PO SUSP
30.0000 mL | ORAL | Status: DC | PRN
Start: 2020-09-29 — End: 2020-10-04
  Administered 2020-09-30: 30 mL via ORAL
  Filled 2020-09-29: qty 30

## 2020-09-29 MED ORDER — METOCLOPRAMIDE HCL 5 MG PO TABS: 5.0000 mg | ORAL_TABLET | Freq: Three times a day (TID) | ORAL | Status: DC | PRN

## 2020-09-29 MED ORDER — PROMETHAZINE HCL 25 MG/ML IJ SOLN: 6.2500 mg | INTRAMUSCULAR | Status: DC | PRN

## 2020-09-29 MED ORDER — ONDANSETRON HCL 4 MG/2ML IJ SOLN
4.0000 mg | Freq: Four times a day (QID) | INTRAMUSCULAR | Status: DC | PRN
  Administered 2020-09-30: 4 mg via INTRAVENOUS
  Filled 2020-09-29: qty 2

## 2020-09-29 MED ORDER — CEFAZOLIN SODIUM-DEXTROSE 1-4 GM/50ML-% IV SOLN
1.0000 g | Freq: Four times a day (QID) | INTRAVENOUS | Status: AC
  Administered 2020-09-29 (×2): 1 g via INTRAVENOUS
  Filled 2020-09-29 (×3): qty 50

## 2020-09-29 MED ORDER — BUPIVACAINE IN DEXTROSE 0.75-8.25 % IT SOLN
INTRATHECAL | Status: DC | PRN
  Administered 2020-09-29: 1.8 mL via INTRATHECAL

## 2020-09-29 MED ORDER — PHENYLEPHRINE 40 MCG/ML (10ML) SYRINGE FOR IV PUSH (FOR BLOOD PRESSURE SUPPORT)
PREFILLED_SYRINGE | INTRAVENOUS | Status: DC | PRN
  Administered 2020-09-29 (×2): 80 ug via INTRAVENOUS
  Administered 2020-09-29: 120 ug via INTRAVENOUS
  Administered 2020-09-29: 40 ug via INTRAVENOUS
  Administered 2020-09-29: 80 ug via INTRAVENOUS

## 2020-09-29 MED ORDER — MENTHOL 3 MG MT LOZG: 1.0000 | LOZENGE | OROMUCOSAL | Status: DC | PRN

## 2020-09-29 MED ORDER — SODIUM CHLORIDE 0.9 % IR SOLN
Status: DC | PRN
  Administered 2020-09-29: 1000 mL

## 2020-09-29 MED ORDER — ONDANSETRON HCL 4 MG/2ML IJ SOLN
INTRAMUSCULAR | Status: AC
  Filled 2020-09-29: qty 2

## 2020-09-29 MED ORDER — DOCUSATE SODIUM 100 MG PO CAPS
100.0000 mg | ORAL_CAPSULE | Freq: Two times a day (BID) | ORAL | Status: DC
  Administered 2020-09-29 – 2020-10-04 (×10): 100 mg via ORAL
  Filled 2020-09-29 (×10): qty 1

## 2020-09-29 MED ORDER — PRAVASTATIN SODIUM 20 MG PO TABS
40.0000 mg | ORAL_TABLET | Freq: Every day | ORAL | Status: DC
Start: 2020-09-29 — End: 2020-10-04
  Administered 2020-09-29 – 2020-10-03 (×5): 40 mg via ORAL
  Filled 2020-09-29 (×5): qty 2

## 2020-09-29 MED ORDER — POVIDONE-IODINE 10 % EX SWAB
2.0000 "application " | Freq: Once | CUTANEOUS | Status: AC
  Administered 2020-09-29: 2 via TOPICAL

## 2020-09-29 MED ORDER — TRANEXAMIC ACID-NACL 1000-0.7 MG/100ML-% IV SOLN
1000.0000 mg | INTRAVENOUS | Status: AC
  Administered 2020-09-29: 1000 mg via INTRAVENOUS

## 2020-09-29 MED ORDER — FENTANYL CITRATE (PF) 100 MCG/2ML IJ SOLN
INTRAMUSCULAR | Status: AC
  Filled 2020-09-29: qty 2

## 2020-09-29 MED ORDER — LACTATED RINGERS IV SOLN: INTRAVENOUS | Status: DC

## 2020-09-29 MED ORDER — IRBESARTAN-HYDROCHLOROTHIAZIDE 300-12.5 MG PO TABS: 1.0000 | ORAL_TABLET | Freq: Every day | ORAL | Status: DC

## 2020-09-29 MED ORDER — HYDROMORPHONE HCL 1 MG/ML IJ SOLN
0.2500 mg | INTRAMUSCULAR | Status: DC | PRN
Start: 2020-09-29 — End: 2020-09-29

## 2020-09-29 MED ORDER — METHOCARBAMOL 500 MG IVPB - SIMPLE MED
500.0000 mg | Freq: Four times a day (QID) | INTRAVENOUS | Status: DC | PRN
  Filled 2020-09-29: qty 50

## 2020-09-29 MED ORDER — MEPERIDINE HCL 50 MG/ML IJ SOLN: 6.2500 mg | INTRAMUSCULAR | Status: DC | PRN

## 2020-09-29 MED ORDER — PROPOFOL 500 MG/50ML IV EMUL
INTRAVENOUS | Status: DC | PRN
  Administered 2020-09-29: 100 ug/kg/min via INTRAVENOUS

## 2020-09-29 MED ORDER — SODIUM CHLORIDE 0.9 % IV SOLN: INTRAVENOUS | Status: DC

## 2020-09-29 MED ORDER — ORAL CARE MOUTH RINSE: 15.0000 mL | Freq: Once | OROMUCOSAL | Status: AC

## 2020-09-29 MED ORDER — ACETAMINOPHEN 325 MG PO TABS
325.0000 mg | ORAL_TABLET | Freq: Four times a day (QID) | ORAL | Status: DC | PRN
  Administered 2020-10-01 – 2020-10-02 (×3): 650 mg via ORAL
  Administered 2020-10-03 – 2020-10-04 (×2): 325 mg via ORAL
  Administered 2020-10-04: 650 mg via ORAL
  Filled 2020-09-29: qty 1
  Filled 2020-09-29 (×4): qty 2
  Filled 2020-09-29: qty 1

## 2020-09-29 MED ORDER — FENTANYL CITRATE (PF) 250 MCG/5ML IJ SOLN
INTRAMUSCULAR | Status: DC | PRN
  Administered 2020-09-29 (×2): 50 ug via INTRAVENOUS

## 2020-09-29 MED ORDER — CHLORHEXIDINE GLUCONATE 0.12 % MT SOLN
15.0000 mL | Freq: Once | OROMUCOSAL | Status: AC
  Administered 2020-09-29: 15 mL via OROMUCOSAL

## 2020-09-29 MED ORDER — VITAMIN D 25 MCG (1000 UNIT) PO TABS
5000.0000 [IU] | ORAL_TABLET | Freq: Every day | ORAL | Status: DC
  Administered 2020-09-30 – 2020-10-04 (×5): 5000 [IU] via ORAL
  Filled 2020-09-29 (×6): qty 5

## 2020-09-29 SURGICAL SUPPLY — 42 items
ACETAB CUP W/GRIPTION 54 (Plate) ×2 IMPLANT
APL SKNCLS STERI-STRIP NONHPOA (GAUZE/BANDAGES/DRESSINGS)
BAG SPEC THK2 15X12 ZIP CLS (MISCELLANEOUS)
BAG ZIPLOCK 12X15 (MISCELLANEOUS) IMPLANT
BALL HIP ARTICU EZE 36 8.5 (Hips) IMPLANT
BENZOIN TINCTURE PRP APPL 2/3 (GAUZE/BANDAGES/DRESSINGS) IMPLANT
BLADE SAW SGTL 18X1.27X75 (BLADE) ×2 IMPLANT
COLLAR OFFSET CORAIL SZ 11 HIP (Stem) IMPLANT
CORAIL OFFSET COLLAR SZ 11 HIP (Stem) ×2 IMPLANT
COVER PERINEAL POST (MISCELLANEOUS) ×2 IMPLANT
COVER SURGICAL LIGHT HANDLE (MISCELLANEOUS) ×2 IMPLANT
COVER WAND RF STERILE (DRAPES) ×2 IMPLANT
CUP ACETAB W/GRIPTION 54 (Plate) IMPLANT
DRAPE STERI IOBAN 125X83 (DRAPES) ×2 IMPLANT
DRAPE U-SHAPE 47X51 STRL (DRAPES) ×4 IMPLANT
DRSG AQUACEL AG ADV 3.5X10 (GAUZE/BANDAGES/DRESSINGS) ×2 IMPLANT
DURAPREP 26ML APPLICATOR (WOUND CARE) ×2 IMPLANT
ELECT REM PT RETURN 15FT ADLT (MISCELLANEOUS) ×2 IMPLANT
GAUZE XEROFORM 1X8 LF (GAUZE/BANDAGES/DRESSINGS) ×2 IMPLANT
GLOVE SRG 8 PF TXTR STRL LF DI (GLOVE) ×2 IMPLANT
GLOVE SURG ENC MOIS LTX SZ7.5 (GLOVE) ×2 IMPLANT
GLOVE SURG LTX SZ8 (GLOVE) ×2 IMPLANT
GLOVE SURG UNDER POLY LF SZ8 (GLOVE) ×4
GOWN STRL REUS W/TWL XL LVL3 (GOWN DISPOSABLE) ×4 IMPLANT
HANDPIECE INTERPULSE COAX TIP (DISPOSABLE) ×2
HIP BALL ARTICU EZE 36 8.5 (Hips) ×2 IMPLANT
HOLDER FOLEY CATH W/STRAP (MISCELLANEOUS) ×2 IMPLANT
KIT TURNOVER KIT A (KITS) ×2 IMPLANT
LINER NEUTRAL 54X36MM PLUS 4 (Hips) ×1 IMPLANT
PACK ANTERIOR HIP CUSTOM (KITS) ×2 IMPLANT
PENCIL SMOKE EVACUATOR (MISCELLANEOUS) IMPLANT
SET HNDPC FAN SPRY TIP SCT (DISPOSABLE) ×1 IMPLANT
STAPLER VISISTAT 35W (STAPLE) IMPLANT
STRIP CLOSURE SKIN 1/2X4 (GAUZE/BANDAGES/DRESSINGS) IMPLANT
SUT ETHIBOND NAB CT1 #1 30IN (SUTURE) ×2 IMPLANT
SUT ETHILON 2 0 PS N (SUTURE) IMPLANT
SUT MNCRL AB 4-0 PS2 18 (SUTURE) IMPLANT
SUT VIC AB 0 CT1 36 (SUTURE) ×2 IMPLANT
SUT VIC AB 1 CT1 36 (SUTURE) ×2 IMPLANT
SUT VIC AB 2-0 CT1 27 (SUTURE) ×4
SUT VIC AB 2-0 CT1 TAPERPNT 27 (SUTURE) ×2 IMPLANT
TRAY FOLEY MTR SLVR 16FR STAT (SET/KITS/TRAYS/PACK) IMPLANT

## 2020-09-29 NOTE — Anesthesia Postprocedure Evaluation (Signed)
Anesthesia Post Note  Patient: Juan Hudson  Procedure(s) Performed: LEFT TOTAL HIP ARTHROPLASTY ANTERIOR APPROACH (Left Hip)     Patient location during evaluation: PACU Anesthesia Type: Spinal Level of consciousness: oriented and awake and alert Pain management: pain level controlled Vital Signs Assessment: post-procedure vital signs reviewed and stable Respiratory status: spontaneous breathing, respiratory function stable and patient connected to nasal cannula oxygen Cardiovascular status: blood pressure returned to baseline and stable Postop Assessment: no headache, no backache and no apparent nausea or vomiting Anesthetic complications: no   No complications documented.  Last Vitals:  Vitals:   09/29/20 1330 09/29/20 1528  BP: 131/86 140/85  Pulse: 97 90  Resp:  17  Temp: 36.5 C 36.7 C  SpO2: 96% 94%    Last Pain:  Vitals:   09/29/20 1528  TempSrc: Oral  PainSc:                  Nolon Nations

## 2020-09-29 NOTE — Evaluation (Signed)
Physical Therapy Evaluation Patient Details Name: Juan Hudson MRN: [id number] DOB: 16-Dec-1945 Today's Date: 09/29/2020   History of Present Illness  Patient is 75 y.o. male s/p Lt THA anterior approach on 09/29/2020 with PMH significant for OA, HTN, HLD, skin cancer, L2-4 fusion (2021), and Rt THA (2021).  Clinical Impression  Pt is a 75y.o. male s/p Lt THA POD 0. Pt reports that he is modified independent with use of RW and cane for mobility at baseline. Pt required MIN-MOD assist for progression of Lt LE to EOB. Pt required MIN assist for stability and cues for safe hand placement with sit to stand transfer. Pt required MIN assist for safety with stand pivot to recliner. Pt was limited with further mobility this session 2/2 increased pain. PT reviewed therapeutic intervention for promotion of DVT prevention, pt demonstrated understanding. Pt will be staying with his friend who can provide intermittent supervision upon discharge. Pt will benefit from skilled PT to increase independence and safety with mobility. Acute therapy to follow up during stay to progress functional mobility as able to ensure safe discharge home.       Follow Up Recommendations Home health PT;Follow surgeon's recommendation for DC plan and follow-up therapies    Equipment Recommendations  None recommended by PT (pt owns RW)    Recommendations for Other Services       Precautions / Restrictions Precautions Precautions: Fall Restrictions Weight Bearing Restrictions: No Other Position/Activity Restrictions: WBAT      Mobility  Bed Mobility Overal bed mobility: Needs Assistance Bed Mobility: Supine to Sit     Supine to sit: Min assist;Mod assist;HOB elevated     General bed mobility comments: MIN- MOD assist for progression of Lt LE and cues for hand placement and sequencing.    Transfers Overall transfer level: Needs assistance Equipment used: Rolling walker (2 wheeled) Transfers: Sit to/from  [facility] Sit to Stand: Min assist;From elevated surface Stand pivot transfers: Min assist       General transfer comment: MIN assist for stability with cues for safd hand placement. Pt perfomed standing marches with B UEs on RW and no LOB. MIN assist for stand pivot from EOB to recliner for safety.  Ambulation/Gait                Stairs            Wheelchair Mobility    Modified Rankin (Stroke Patients Only)       Balance Overall balance assessment: Needs assistance Sitting-balance support: Feet supported Sitting balance-Leahy Scale: Good     Standing balance support: Bilateral upper extremity supported;During functional activity Standing balance-Leahy Scale: Poor Standing balance comment: use of RW to maintain standing balance                             Pertinent Vitals/Pain Pain Assessment: 0-10 Pain Score: 7  Pain Location: Lt hip Pain Descriptors / Indicators: Aching Pain Intervention(s): Limited activity within patient's tolerance;Monitored during session;Repositioned;RN gave pain meds during session;Ice applied    Home Living Family/patient expects to be discharged to:: Private residence Living Arrangements: Alone Available Help at Discharge: Friend(s);Available PRN/intermittently Type of Home: House Home Access: Stairs to enter Entrance Stairs-Rails: Right Entrance Stairs-Number of Steps: 3-4 Home Layout: Two level;Able to live on main level with bedroom/bathroom Home Equipment: Gilford Rile - 2 wheels;Cane - single point;Bedside commode Additional Comments: pt plans to stay on main level at friends house  Prior Function Level of Independence: Independent with assistive device(s)         Comments: use of RW for household ambulation and cane for commmunity     Hand Dominance   Dominant Hand: Right    Extremity/Trunk Assessment   Upper Extremity Assessment Upper Extremity Assessment: Overall WFL for tasks  assessed    Lower Extremity Assessment Lower Extremity Assessment: LLE deficits/detail LLE Deficits / Details: B ankle dorsi/plantar flexion strength 4/5 LLE: Unable to fully assess due to pain LLE Sensation: WNL LLE Coordination: WNL    Cervical / Trunk Assessment Cervical / Trunk Assessment: Normal  Communication   Communication: No difficulties  Cognition Arousal/Alertness: Awake/alert Behavior During Therapy: WFL for tasks assessed/performed Overall Cognitive Status: Within Functional Limits for tasks assessed                                        General Comments      Exercises Total Joint Exercises Ankle Circles/Pumps: AROM;Both;20 reps;Seated   Assessment/Plan    PT Assessment Patient needs continued PT services  PT Problem List Decreased strength;Decreased range of motion;Decreased activity tolerance;Decreased balance;Decreased mobility;Decreased knowledge of use of DME;Pain       PT Treatment Interventions DME instruction;Gait training;Stair training;Functional mobility training;Therapeutic activities;Therapeutic exercise;Balance training;Patient/family education    PT Goals (Current goals can be found in the Care Plan section)  Acute Rehab PT Goals Patient Stated Goal: walk and jog for exercise PT Goal Formulation: With patient Time For Goal Achievement: 10/06/20 Potential to Achieve Goals: Good    Frequency 7X/week   Barriers to discharge        Co-evaluation               AM-PAC PT "6 Clicks" Mobility  Outcome Measure Help needed turning from your back to your side while in a flat bed without using bedrails?: A Little Help needed moving from lying on your back to sitting on the side of a flat bed without using bedrails?: A Little Help needed moving to and from a bed to a chair (including a wheelchair)?: A Little Help needed standing up from a chair using your arms (e.g., wheelchair or bedside chair)?: A Little Help needed to  walk in hospital room?: A Little Help needed climbing 3-5 steps with a railing? : A Lot 6 Click Score: 17    End of Session Equipment Utilized During Treatment: Gait belt Activity Tolerance: Patient tolerated treatment well;Patient limited by pain Patient left: in chair;with call bell/phone within reach;with chair alarm set Nurse Communication: Mobility status PT Visit Diagnosis: Unsteadiness on feet (R26.81);Muscle weakness (generalized) (M62.81);Pain Pain - Right/Left: Left Pain - part of body: Hip    Time: 5462-7035 PT Time Calculation (min) (ACUTE ONLY): 23 min   Charges:             Elna Breslow, SPT  Acute rehab    Elna Breslow 09/29/2020, 2:33 PM

## 2020-09-29 NOTE — Brief Op Note (Signed)
09/29/2020  8:24 AM  PATIENT:  Juan Hudson  75 y.o. male  PRE-OPERATIVE DIAGNOSIS:  osteoarthritis left hip  POST-OPERATIVE DIAGNOSIS:  osteoarthritis left hip  PROCEDURE:  Procedure(s): LEFT TOTAL HIP ARTHROPLASTY ANTERIOR APPROACH (Left)  SURGEON:  Surgeon(s) and Role:    Mcarthur Rossetti, MD - Primary  PHYSICIAN ASSISTANT:  Benita Stabile, PA-C  ANESTHESIA:   spinal  EBL:  200 mL   COUNTS:  YES  DICTATION: .Other Dictation: Dictation Number [id number]  PLAN OF CARE: Admit for overnight observation  PATIENT DISPOSITION:  PACU - hemodynamically stable.   Delay start of Pharmacological VTE agent (>24hrs) due to surgical blood loss or risk of bleeding: no

## 2020-09-29 NOTE — Plan of Care (Signed)
  Problem: Pain Managment: Goal: General experience of comfort will improve Outcome: Progressing   

## 2020-09-29 NOTE — Interval H&P Note (Signed)
History and Physical Interval Note: The patient understands that he is here today for a left total hip arthroplasty to treat the pain from his left hip osteoarthritis.  There is been no recent acute change in his medical status.  See recent H&P.  The risks and benefits of surgery have been explained in detail and informed consent is obtained.  The left hip has been marked.  09/29/2020 7:01 AM  [name]  has presented today for surgery, with the diagnosis of osteoarthritis left hip.  The various methods of treatment have been discussed with the patient and family. After consideration of risks, benefits and other options for treatment, the patient has consented to  Procedure(s): LEFT TOTAL HIP ARTHROPLASTY ANTERIOR APPROACH (Left) as a surgical intervention.  The patient's history has been reviewed, patient examined, no change in status, stable for surgery.  I have reviewed the patient's chart and labs.  Questions were answered to the patient's satisfaction.     Mcarthur Rossetti

## 2020-09-29 NOTE — Progress Notes (Signed)
PHARMACIST - PHYSICIAN ORDER COMMUNICATION  CONCERNING: P&T Medication Policy on Herbal Medications  DESCRIPTION:  This patient's order for: CoQ10 has been noted.  This product(s) is classified as an "herbal" or natural product. Due to a lack of definitive safety studies or FDA approval, nonstandard manufacturing practices, plus the potential risk of unknown drug-drug interactions while on inpatient medications, the Pharmacy and Therapeutics Committee does not permit the use of "herbal" or natural products of this type within [facility].   ACTION TAKEN: The pharmacy department is unable to verify this order at this time and your patient has been informed of this safety policy. Please reevaluate patient's clinical condition at discharge and address if the herbal or natural product(s) should be resumed at that time.   Lindell Spar, PharmD, BCPS 09/29/2020 11:25 AM

## 2020-09-29 NOTE — Op Note (Signed)
NAMEJERMARI, TAMARGO MEDICAL RECORD NO: [id number] ACCOUNT NO: [id number] DATE OF BIRTH: [birth year] FACILITY: [facility] LOCATION: [facility] PHYSICIAN: Lind Guest. Ninfa Linden, MD  Operative Report   DATE OF PROCEDURE: 09/29/2020  PREOPERATIVE DIAGNOSIS:  Primary osteoarthritis and degenerative joint disease, left hip.  POSTOPERATIVE DIAGNOSIS:  Primary osteoarthritis and degenerative joint disease, left hip.  PROCEDURE:  Left total hip arthroplasty through direct anterior approach.  IMPLANTS:  DePuy sector Gription acetabular component, size 54, size 36+4 neutral polyethylene liner, size 11 Corail femoral component with high offset, size 36+8.5 metal hip ball.  SURGEON:  Lind Guest. Ninfa Linden, MD  ASSISTANT:  Erskine Emery, PA-C  ANESTHESIA:  Spinal.  ANTIBIOTICS:  2 g IV Ancef.  ESTIMATED BLOOD LOSS:  476 mL  COMPLICATIONS:  None.  INDICATIONS:  The patient is a 75 year old gentleman with debilitating arthritis involving both his hips that is well documented.  We actually replaced his right hip successfully through a direct anterior approach just this past December.  He is now  presenting to have his left hip pain.  His left hip pain is daily and is detrimentally affecting his mobility, his quality of life and his activities of daily living.  His x-rays show end-stage arthritis of the left hip.  Having had this done before he  is fully aware of the risk of acute blood loss anemia, nerve or vessel injury, fracture, infection, dislocation, DVT and implant failure as well as skin and soft tissue issues.  He understands our goals are to decrease pain, improve mobility and overall  improve quality of life.  DESCRIPTION OF PROCEDURE:  After informed consent was obtained, appropriate left hip was marked.  He was brought to the operating room and sat up on a stretcher.  Spinal anesthesia was obtained, he was laid in supine position on a stretcher.  Foley  catheter was placed.  I was able  to assess his leg lengths.  He is definitely shorter on the left operative side than the right side that we replaced.  Traction boots were placed on both his feet.  He was placed supine on the Hana fracture table, the  perineal post in place and both legs in line skeletal traction devices and no traction applied.  His left operative hip was prepped and draped with DuraPrep and sterile drapes.  A timeout was called and he was identified correct patient, correct left  hip.  I then made an incision just inferior and posterior to the anterior superior iliac spine and carried this obliquely down the leg.  We dissected down tensor fascia lata muscle.  Tensor fascia was then divided longitudinally to proceed with direct  anterior approach to the hip.  We identified and cauterized circumflex vessels and we then identified the hip capsule, opened up the hip capsule in a L-type format finding a very large joint effusion.  We placed Cobra retractors in the medial and lateral  femoral neck and then made our femoral neck cut with an oscillating saw just proximal to the lesser trochanter.  We completed this with an osteotome and placed a corkscrew guide in the femoral head and found a wide area devoid of cartilage.  I then  removed periarticular osteophytes around the acetabulum as well as remnants of the acetabular labrum.  I placed a bent Hohmann over the medial acetabular rim and then began reaming under direct visualization from a size 43 reamer in a stepwise increments  going up to a size 53 with all reamers placed under direct  visualization, the last reamer was placed under direct fluoroscopy, so we could obtain our depth of reaming, our inclination and anteversion.  I then placed a real DePuy sector Gription  acetabular component, size 54 and a 36+4 polyethylene liner given the fact that we medialized the cup.  Attention was then turned to the femur with the leg externally rotated to 120 degrees extended and  adducted. We were able to place a Mueller retractor  medially and Hohman retractor behind the greater trochanter.  We released lateral joint capsule and used a box cutting osteotome to enter the femoral canal and a rongeur to lateralize, then began broaching using the carotid broaching system starting  with a size 8 going in a stepwise increments up to size 11.  With a size 11 in place we trialed a standard offset femoral neck and a 36+15 hip ball, we reduced this in the acetabulum and right away we could see we needed more leg length and offset.  We  dislocated the hip and removed the trial components.  We placed the real Corail femoral component, size 11.  We went with a high offset femoral component.  With a real 36+8.5 metal hip ball again reduced this and acetabulum and it was very stable on  exam.  We assessed mechanically and radiographically and we were pleased with leg length offset, range of motion and stability.  We then irrigated the soft tissue with normal saline solution using pulsatile lavage.  We closed the joint capsule with  interrupted #1 Ethibond suture followed by #1 Vicryl to close the tensor fascia.  0 Vicryl was used to close the deep tissue and 2-0 Vicryl was used to close subcutaneous tissue.  The skin was reapproximated with staples.  Aquacel dressing was applied.   He was taken to recovery room in stable condition with all final counts being correct.  No complications noted.  Of note, Benita Stabile, PA-C assisted during the entire case and his assistance was crucial for facilitating all aspects of this case.   PUS D: 09/29/2020 8:23:07 am T: 09/29/2020 10:46:00 am  JOB: [id number]/ [id number]

## 2020-09-29 NOTE — Transfer of Care (Signed)
Immediate Anesthesia Transfer of Care Note  Patient: Juan Hudson  Procedure(s) Performed: LEFT TOTAL HIP ARTHROPLASTY ANTERIOR APPROACH (Left Hip)  Patient Location: PACU  Anesthesia Type:MAC and Spinal  Level of Consciousness: awake and alert   Airway & Oxygen Therapy: Patient Spontanous Breathing and Patient connected to face mask oxygen  Post-op Assessment: Report given to RN and Post -op Vital signs reviewed and stable  Post vital signs: Reviewed and stable  Last Vitals:  Vitals Value Taken Time  BP 99/56 09/29/20 0841  Temp    Pulse 85 09/29/20 0843  Resp 17 09/29/20 0843  SpO2 100 % 09/29/20 0843  Vitals shown include unvalidated device data.  Last Pain:  Vitals:   09/29/20 0551  TempSrc:   PainSc: 1       Patients Stated Pain Goal: 4 (41/14/64 3142)  Complications: No complications documented.

## 2020-09-29 NOTE — Anesthesia Procedure Notes (Signed)
Spinal  Patient location during procedure: OR Start time: 09/29/2020 7:15 AM End time: 09/29/2020 7:21 AM Staffing Performed: anesthesiologist  Anesthesiologist: Nolon Nations, MD Preanesthetic Checklist Completed: patient identified, IV checked, site marked, risks and benefits discussed, surgical consent, monitors and equipment checked, pre-op evaluation and timeout performed Spinal Block Patient position: sitting Prep: Betadine, DuraPrep and site prepped and draped Patient monitoring: heart rate, continuous pulse ox and blood pressure Approach: right paramedian Location: L2-3 Injection technique: single-shot Needle Needle type: Sprotte  Needle gauge: 24 G Needle length: 9 cm Additional Notes Expiration date of kit checked and confirmed. Patient tolerated procedure well, without complications.

## 2020-09-30 DIAGNOSIS — N3281 Overactive bladder: Secondary | ICD-10-CM | POA: Diagnosis present

## 2020-09-30 DIAGNOSIS — Z602 Problems related to living alone: Secondary | ICD-10-CM | POA: Diagnosis present

## 2020-09-30 DIAGNOSIS — Z881 Allergy status to other antibiotic agents status: Secondary | ICD-10-CM | POA: Diagnosis not present

## 2020-09-30 DIAGNOSIS — Z7982 Long term (current) use of aspirin: Secondary | ICD-10-CM | POA: Diagnosis not present

## 2020-09-30 DIAGNOSIS — Z87891 Personal history of nicotine dependence: Secondary | ICD-10-CM | POA: Diagnosis not present

## 2020-09-30 DIAGNOSIS — Z79890 Hormone replacement therapy (postmenopausal): Secondary | ICD-10-CM | POA: Diagnosis not present

## 2020-09-30 DIAGNOSIS — R011 Cardiac murmur, unspecified: Secondary | ICD-10-CM | POA: Diagnosis present

## 2020-09-30 DIAGNOSIS — Z79899 Other long term (current) drug therapy: Secondary | ICD-10-CM | POA: Diagnosis not present

## 2020-09-30 DIAGNOSIS — R4182 Altered mental status, unspecified: Secondary | ICD-10-CM | POA: Diagnosis present

## 2020-09-30 DIAGNOSIS — Z981 Arthrodesis status: Secondary | ICD-10-CM | POA: Diagnosis not present

## 2020-09-30 DIAGNOSIS — K219 Gastro-esophageal reflux disease without esophagitis: Secondary | ICD-10-CM | POA: Diagnosis present

## 2020-09-30 DIAGNOSIS — Z96641 Presence of right artificial hip joint: Secondary | ICD-10-CM | POA: Diagnosis present

## 2020-09-30 DIAGNOSIS — Z8249 Family history of ischemic heart disease and other diseases of the circulatory system: Secondary | ICD-10-CM | POA: Diagnosis not present

## 2020-09-30 DIAGNOSIS — G4733 Obstructive sleep apnea (adult) (pediatric): Secondary | ICD-10-CM | POA: Diagnosis present

## 2020-09-30 DIAGNOSIS — M1612 Unilateral primary osteoarthritis, left hip: Secondary | ICD-10-CM | POA: Diagnosis not present

## 2020-09-30 DIAGNOSIS — I10 Essential (primary) hypertension: Secondary | ICD-10-CM | POA: Diagnosis present

## 2020-09-30 DIAGNOSIS — E785 Hyperlipidemia, unspecified: Secondary | ICD-10-CM | POA: Diagnosis present

## 2020-09-30 DIAGNOSIS — M160 Bilateral primary osteoarthritis of hip: Secondary | ICD-10-CM | POA: Diagnosis present

## 2020-09-30 LAB — CBC
HCT: 37.9 % — ABNORMAL LOW (ref 39.0–52.0)
Hemoglobin: 12.1 g/dL — ABNORMAL LOW (ref 13.0–17.0)
MCH: 28.7 pg (ref 26.0–34.0)
MCHC: 31.9 g/dL (ref 30.0–36.0)
MCV: 90 fL (ref 80.0–100.0)
Platelets: 154 10*3/uL (ref 150–400)
RBC: 4.21 MIL/uL — ABNORMAL LOW (ref 4.22–5.81)
RDW: 15.3 % (ref 11.5–15.5)
WBC: 8.9 10*3/uL (ref 4.0–10.5)
nRBC: 0 % (ref 0.0–0.2)

## 2020-09-30 LAB — BASIC METABOLIC PANEL
Anion gap: 7 (ref 5–15)
BUN: 13 mg/dL (ref 8–23)
CO2: 28 mmol/L (ref 22–32)
Calcium: 8.3 mg/dL — ABNORMAL LOW (ref 8.9–10.3)
Chloride: 99 mmol/L (ref 98–111)
Creatinine, Ser: 0.78 mg/dL (ref 0.61–1.24)
GFR, Estimated: >60 mL/min (ref >60)
Glucose, Bld: 121 mg/dL — ABNORMAL HIGH (ref 70–99)
Potassium: 3.6 mmol/L (ref 3.5–5.1)
Sodium: 134 mmol/L — ABNORMAL LOW (ref 135–145)

## 2020-09-30 MED ORDER — OXYCODONE HCL 5 MG PO TABS: 5.0000 mg | ORAL_TABLET | ORAL | 0 refills | Status: DC | PRN

## 2020-09-30 MED ORDER — ASPIRIN 81 MG PO CHEW: 81.0000 mg | CHEWABLE_TABLET | Freq: Two times a day (BID) | ORAL | 0 refills | Status: DC

## 2020-09-30 MED ORDER — HYDROCHLOROTHIAZIDE 12.5 MG PO CAPS
12.5000 mg | ORAL_CAPSULE | Freq: Every day | ORAL | Status: DC
  Administered 2020-09-30 – 2020-10-04 (×5): 12.5 mg via ORAL
  Filled 2020-09-30 (×4): qty 1

## 2020-09-30 MED ORDER — ONDANSETRON 4 MG PO TBDP: 4.0000 mg | ORAL_TABLET | Freq: Three times a day (TID) | ORAL | 0 refills | Status: DC | PRN

## 2020-09-30 MED ORDER — IRBESARTAN 150 MG PO TABS
300.0000 mg | ORAL_TABLET | Freq: Every day | ORAL | Status: DC
  Administered 2020-09-30 – 2020-10-04 (×5): 300 mg via ORAL
  Filled 2020-09-30 (×4): qty 2

## 2020-09-30 NOTE — Progress Notes (Signed)
Subjective: 1 Day Post-Op Procedure(s) (LRB): LEFT TOTAL HIP ARTHROPLASTY ANTERIOR APPROACH (Left) Patient reports pain as moderate.  H/H stable.  Left hip stable.  Slight confusion, but this is how he was in December when we replaced his right hip.  Objective: Vital signs in last 24 hours: Temp:  [97.6 F (36.4 C)-98.5 F (36.9 C)] 98.2 F (36.8 C) (03/12 0525) Pulse Rate:  [62-107] 107 (03/12 0525) Resp:  [12-20] 20 (03/12 0525) BP: (107-156)/(65-94) 156/94 (03/12 0525) SpO2:  [94 %-100 %] 97 % (03/12 0525)  Intake/Output from previous day: 03/11 0701 - 03/12 0700 In: 7517 [P.O.:1260; I.V.:1900; IV Piggyback:100] Out: 2100 [Urine:1900; Blood:200] Intake/Output this shift: No intake/output data recorded.  Recent Labs    09/30/20 0304  HGB 12.1*   Recent Labs    09/30/20 0304  WBC 8.9  RBC 4.21*  HCT 37.9*  PLT 154   Recent Labs    09/30/20 0304  NA 134*  K 3.6  CL 99  CO2 28  BUN 13  CREATININE 0.78  GLUCOSE 121*  CALCIUM 8.3*   No results for input(s): LABPT, INR in the last 72 hours.  Sensation intact distally Intact pulses distally Dorsiflexion/Plantar flexion intact Incision: dressing C/D/I   Assessment/Plan: 1 Day Post-Op Procedure(s) (LRB): LEFT TOTAL HIP ARTHROPLASTY ANTERIOR APPROACH (Left) Up with therapy Will change to an inpatient admission.  I expect him to be here for a day or 2 more.  The last time he was here, he eventually was discharged to skilled nursing and only stayed there a day and check himself out.  He hopes to be able to go home and stay with a friend.  He is definitely not safe to be discharged today.      Mcarthur Rossetti 09/30/2020, 8:55 AM

## 2020-09-30 NOTE — Progress Notes (Signed)
Dr Marlou Sa returned page made aware of yellow mews and urinary frequency, orders received. Bethann Punches RN

## 2020-09-30 NOTE — Progress Notes (Signed)
Physical Therapy Treatment Patient Details Name: Juan Hudson MRN: [id number] DOB: 1945-08-14 Today's Date: 09/30/2020    History of Present Illness Patient is 75 y.o. male s/p Lt THA direct anterior approach on 09/29/2020 with PMH significant for OA, HTN, HLD, skin cancer, L2-4 fusion (2021), and Rt THA (2021).    PT Comments    Progressing slowly with mobility. Remains a bit confused but he participates fairly well. Will continue to follow and progress activity as able.    Follow Up Recommendations  Follow surgeon's recommendation for DC plan and follow-up therapies;Supervision/Assistance - 24 hour     Equipment Recommendations  None recommended by PT    Recommendations for Other Services       Precautions / Restrictions Precautions Precautions: Fall Precaution Comments: confusion Restrictions Weight Bearing Restrictions: No Other Position/Activity Restrictions: WBAT    Mobility  Bed Mobility               General bed mobility comments: oob in recliner    Transfers Overall transfer level: Needs assistance Equipment used: Rolling walker (2 wheeled) Transfers: Sit to/from Stand Sit to Stand: Mod assist         General transfer comment: Assist to rise, steady, control descent. Cues for safety, technique, hand placement. Increased difficulty rising from recliner. Posterior bias requiring assist from therapist to avoid LOB. Pt had difficulty maintaining static standing balance while trying to use urinal-Mod Assist for balance.   Ambulation/Gait Ambulation/Gait assistance: Min assist Gait Distance (Feet): 135 Feet Assistive device: Rolling walker (2 wheeled) Gait Pattern/deviations: Step-to pattern;Step-through pattern;Decreased stride length;Decreased step length - right;Decreased step length - left     General Gait Details: Cues for safety, technique, RW proximity, and to increase step lengths bilaterally. Assist to stabilize pt throughout the  distance.   Stairs             Wheelchair Mobility    Modified Rankin (Stroke Patients Only)       Balance Overall balance assessment: Needs assistance         Standing balance support: Bilateral upper extremity supported Standing balance-Leahy Scale: Poor                              Cognition Arousal/Alertness: Awake/alert Behavior During Therapy: WFL for tasks assessed/performed Overall Cognitive Status: Within Functional Limits for tasks assessed                                        Exercises Total Joint Exercises Ankle Circles/Pumps: AROM;Both;10 reps;Supine Quad Sets: AROM;Both;10 reps;Supine Heel Slides: AAROM;Left;10 reps;Supine Hip ABduction/ADduction: AAROM;Left;10 reps;Supine    General Comments        Pertinent Vitals/Pain Pain Assessment: Faces Faces Pain Scale: Hurts even more Pain Location: L hip Pain Descriptors / Indicators: Discomfort;Grimacing;Sore Pain Intervention(s): Limited activity within patient's tolerance;Monitored during session;Repositioned;Ice applied    Home Living                      Prior Function            PT Goals (current goals can now be found in the care plan section) Progress towards PT goals: Progressing toward goals    Frequency    7X/week      PT Plan Current plan remains appropriate    Co-evaluation  AM-PAC PT "6 Clicks" Mobility   Outcome Measure  Help needed turning from your back to your side while in a flat bed without using bedrails?: A Little Help needed moving from lying on your back to sitting on the side of a flat bed without using bedrails?: A Little Help needed moving to and from a bed to a chair (including a wheelchair)?: A Little Help needed standing up from a chair using your arms (e.g., wheelchair or bedside chair)?: A Little Help needed to walk in hospital room?: A Little Help needed climbing 3-5 steps with a railing? :  A Lot 6 Click Score: 17    End of Session Equipment Utilized During Treatment: Gait belt Activity Tolerance: Patient tolerated treatment well;Patient limited by pain Patient left: in chair;with call bell/phone within reach;with chair alarm set   PT Visit Diagnosis: Other abnormalities of gait and mobility (R26.89);Pain Pain - Right/Left: Left Pain - part of body: Hip     Time: 1696-7893 PT Time Calculation (min) (ACUTE ONLY): 29 min  Charges:  $Gait Training: 8-22 mins $Therapeutic Exercise: 8-22 mins                         Doreatha Massed, PT Acute Rehabilitation  Office: [phone] Pager: [phone]

## 2020-09-30 NOTE — Discharge Instructions (Signed)

## 2020-09-30 NOTE — TOC Initial Note (Signed)
Transition of Care [facility]) - Initial/Assessment Note    Patient Details  Name: Juan Hudson MRN: [id number] Date of Birth: [birth year]  Transition of Care [facility]) CM/SW Contact:    Joaquin Courts, RN Phone Number: 09/30/2020, 10:24 AM  Clinical Narrative:                 [facility] for HHPT, reports has rolling walker and 3in1 at home.  Expected Discharge Plan: [place] Barriers to Discharge: No Barriers Identified   Patient Goals and CMS Choice Patient states their goals for this hospitalization and ongoing recovery are:: to go home with therapy CMS Medicare.gov Compare Post Acute Care list provided to:: Patient Choice offered to / list presented to : Patient  Expected Discharge Plan and Services Expected Discharge Plan: [place]   Discharge Planning Services: CM Consult Post Acute Care Choice: [place] arrangements for the past 2 months: Single Family Home                 DME Arranged: N/A DME Agency: NA       HH Arranged: PT HH Agency: [facility] [employer]     Representative spoke with at [place]: pre-arranged in MD office  Prior Living Arrangements/Services Living arrangements for the past 2 months: [place]   Patient language and need for interpreter reviewed:: Yes Do you feel safe going back to the place where you live?: Yes      Need for Family Participation in Patient Care: Yes (Comment) Care giver support system in place?: Yes (comment)   Criminal Activity/Legal Involvement Pertinent to Current Situation/Hospitalization: No - Comment as needed  Activities of Daily Living Home Assistive Devices/Equipment: Cane (specify quad or straight),CPAP ADL Screening (condition at time of admission) Patient's cognitive ability adequate to safely complete daily activities?: Yes Is the patient deaf or have difficulty hearing?: No Does the patient have difficulty seeing, even when  wearing glasses/contacts?: No Does the patient have difficulty concentrating, remembering, or making decisions?: Yes Patient able to express need for assistance with ADLs?: Yes Does the patient have difficulty dressing or bathing?: No Independently performs ADLs?: Yes (appropriate for developmental age) Does the patient have difficulty walking or climbing stairs?: Yes Weakness of Legs: None Weakness of Arms/Hands: None  Permission Sought/Granted                  Emotional Assessment           Psych Involvement: No (comment)  Admission diagnosis:  Status post total replacement of left hip [F02.774] Patient Active Problem List   Diagnosis Date Noted  . Status post total replacement of right hip 07/08/2020  . Status post total replacement of left hip 07/07/2020  . Unilateral primary osteoarthritis, left hip 05/15/2020  . Unilateral primary osteoarthritis, right hip 05/15/2020  . Lumbar stenosis with neurogenic claudication 01/11/2020  . Subacute bronchitis 02/23/2019  . Left lumbar radiculopathy 04/08/2017  . Heart murmur, systolic 12/87/8676  . Hypersomnia 10/12/2015  . Obstructive sleep apnea 02/15/2011  . Foot pain 01/16/2011  . Metatarsalgia of right foot 11/15/2010  . Loss of transverse plantar arch 11/15/2010   PCP:  Crist Infante, MD Pharmacy:   [id number] - [place], [address] [place] [address] Phone: [phone] Fax: [phone]     Social Determinants of Health (SDOH) Interventions    Readmission Risk Interventions No flowsheet data found.

## 2020-09-30 NOTE — Progress Notes (Signed)
Physical Therapy Treatment Patient Details Name: Juan Hudson MRN: [id number] DOB: 03-25-1946 Today's Date: 09/30/2020    History of Present Illness Patient is 75 y.o. male s/p Lt THA anterior approach on 09/29/2020 with PMH significant for OA, HTN, HLD, skin cancer, L2-4 fusion (2021), and Rt THA (2021).    PT Comments    Progressing with mobility. Pt is having some confusion. Per MD notes, this occurred last hospital stay as well. Will continue to follow and progress activity as tolerated.    Follow Up Recommendations  Follow surgeon's recommendation for DC plan and follow-up therapies;Supervision/Assistance - 24 hour     Equipment Recommendations  None recommended by PT    Recommendations for Other Services       Precautions / Restrictions Precautions Precautions: Fall Precaution Comments: confusion Restrictions Weight Bearing Restrictions: No Other Position/Activity Restrictions: WBAT    Mobility  Bed Mobility Overal bed mobility: Needs Assistance Bed Mobility: Supine to Sit     Supine to sit: Mod assist;HOB elevated     General bed mobility comments: Assist for trunk and L LE. Increased time. Cues for safety, technique. Pt relied on bedrail.    Transfers Overall transfer level: Needs assistance Equipment used: Rolling walker (2 wheeled) Transfers: Sit to/from Stand Sit to Stand: Min assist         General transfer comment: Assist to rise, steady, control descent. Cues for safety, technique, hand placement.  Ambulation/Gait Ambulation/Gait assistance: Min assist Gait Distance (Feet): 75 Feet Assistive device: Rolling walker (2 wheeled) Gait Pattern/deviations: Step-to pattern;Step-through pattern;Decreased stride length;Decreased step length - right;Decreased step length - left     General Gait Details: Cues for safety, technique, RW proximity, and to increase step lengths bilaterally. Assist to stabilize pt throughout the distance.   Stairs              Wheelchair Mobility    Modified Rankin (Stroke Patients Only)       Balance Overall balance assessment: Needs assistance         Standing balance support: Bilateral upper extremity supported Standing balance-Leahy Scale: Poor                              Cognition Arousal/Alertness: Awake/alert Behavior During Therapy: WFL for tasks assessed/performed Overall Cognitive Status: Within Functional Limits for tasks assessed                                        Exercises      General Comments        Pertinent Vitals/Pain Pain Assessment: Faces Faces Pain Scale: Hurts even more Pain Location: L hip Pain Descriptors / Indicators: Discomfort;Grimacing;Sore Pain Intervention(s): Limited activity within patient's tolerance;Monitored during session    Home Living                      Prior Function            PT Goals (current goals can now be found in the care plan section) Progress towards PT goals: Progressing toward goals    Frequency    7X/week      PT Plan Current plan remains appropriate    Co-evaluation              AM-PAC PT "6 Clicks" Mobility   Outcome Measure  Help needed turning from  your back to your side while in a flat bed without using bedrails?: A Little Help needed moving from lying on your back to sitting on the side of a flat bed without using bedrails?: A Little Help needed moving to and from a bed to a chair (including a wheelchair)?: A Little Help needed standing up from a chair using your arms (e.g., wheelchair or bedside chair)?: A Little Help needed to walk in hospital room?: A Little Help needed climbing 3-5 steps with a railing? : A Lot 6 Click Score: 17    End of Session Equipment Utilized During Treatment: Gait belt Activity Tolerance: Patient tolerated treatment well;Patient limited by pain Patient left: in chair;with call bell/phone within reach;with chair alarm  set   PT Visit Diagnosis: Unsteadiness on feet (R26.81) Pain - Right/Left: Left Pain - part of body: Hip     Time: 1660-6301 PT Time Calculation (min) (ACUTE ONLY): 21 min  Charges:  $Gait Training: 8-22 mins                        Doreatha Massed, PT Acute Rehabilitation  Office: [phone] Pager: [phone]

## 2020-09-30 NOTE — Plan of Care (Signed)

## 2020-10-01 NOTE — Progress Notes (Addendum)
Physical Therapy Treatment Patient Details Name: Juan Hudson MRN: [id number] DOB: May 13, 1946 Today's Date: 10/01/2020    History of Present Illness Patient is 75 y.o. male s/p Lt THA direct anterior approach on 09/29/2020 with PMH significant for OA, HTN, HLD, skin cancer, L2-4 fusion (2021), and Rt THA (2021).    PT Comments    Pt continues to have some confusion. Moderate pain with activity this session. Continue to recommend 24/7 supervision/assist at discharge. If this will not be available, recommend ST SNF.    Follow Up Recommendations  Follow surgeon's recommendation for DC plan and follow-up therapies;Supervision/Assistance - 24 hour (Home Health vs SNF)     Equipment Recommendations  None recommended by PT    Recommendations for Other Services       Precautions / Restrictions Precautions Precautions: Fall Precaution Comments: confusion Restrictions Weight Bearing Restrictions: No Other Position/Activity Restrictions: WBAT    Mobility  Bed Mobility Overal bed mobility: Needs Assistance Bed Mobility: Supine to Sit          General bed mobility comments: oob in recliner    Transfers Overall transfer level: Needs assistance Equipment used: Rolling walker (2 wheeled) Transfers: Sit to/from Stand Sit to Stand: Min assist        General transfer comment: Assist to steady. Cues for safety, hand placement.  Ambulation/Gait Ambulation/Gait assistance: Min assist Gait Distance (Feet): 250 Feet Assistive device: Rolling walker (2 wheeled) Gait Pattern/deviations: Step-to pattern;Step-through pattern;Decreased stride length;Decreased step length - right;Decreased step length - left     General Gait Details: Cues for safety, technique, RW proximity, and to increase step length.  Assist to stabilize intermittently   Stairs             Wheelchair Mobility    Modified Rankin (Stroke Patients Only)       Balance Overall balance assessment:  Needs assistance Sitting-balance support: No upper extremity supported Sitting balance-Leahy Scale: Fair     Standing balance support: Bilateral upper extremity supported Standing balance-Leahy Scale: Poor Standing balance comment: mild posterior lean at sink                            Cognition Arousal/Alertness: Awake/alert Behavior During Therapy: WFL for tasks assessed/performed Overall Cognitive Status: Impaired/Different from baseline Area of Impairment: Memory                 Orientation Level: Disoriented to;Place;Time;Situation   Memory: Decreased short-term memory         General Comments: Initially didn't remember why he was in the hospital, which hospital but after verbal cue states "Oh yeah"      Exercises Total Joint Exercises Hip ABduction/ADduction: AROM;Left;10 reps;Standing Knee Flexion: AROM;Left;10 reps;Standing Marching in Standing: Left;10 reps;Standing   General Comments        Pertinent Vitals/Pain Pain Assessment: 0-10 Faces Pain Scale: Hurts even more Pain Location: L hip/thigh Pain Descriptors / Indicators: Discomfort;Grimacing;Sore;Guarding Pain Intervention(s): Monitored during session;Repositioned    Home Living Family/patient expects to be discharged to:: Private residence Living Arrangements: Alone Available Help at Discharge: Friend(s);Available PRN/intermittently Type of Home: House Home Access: Stairs to enter Entrance Stairs-Rails: Right Home Layout: Two level;Able to live on main level with bedroom/bathroom Home Equipment: Gilford Rile - 2 wheels;Cane - single point;Bedside commode Additional Comments: pt plans to stay on main level at friends house    Prior Function Level of Independence: Independent with assistive device(s)      Comments: use of  RW for household ambulation and cane for commmunity   PT Goals (current goals can now be found in the care plan section) Acute Rehab PT Goals Patient Stated Goal:  walk and jog for exercise Progress towards PT goals: Progressing toward goals    Frequency    7X/week      PT Plan Current plan remains appropriate    Co-evaluation              AM-PAC PT "6 Clicks" Mobility   Outcome Measure  Help needed turning from your back to your side while in a flat bed without using bedrails?: A Little Help needed moving from lying on your back to sitting on the side of a flat bed without using bedrails?: A Little Help needed moving to and from a bed to a chair (including a wheelchair)?: A Little Help needed standing up from a chair using your arms (e.g., wheelchair or bedside chair)?: A Little Help needed to walk in hospital room?: A Little Help needed climbing 3-5 steps with a railing? : A Little 6 Click Score: 18    End of Session Equipment Utilized During Treatment: Gait belt Activity Tolerance: Patient tolerated treatment well Patient left: in chair;with call bell/phone within reach;with chair alarm set;with family/visitor present   PT Visit Diagnosis: Other abnormalities of gait and mobility (R26.89);Pain Pain - Right/Left: Left Pain - part of body: Hip     Time: 0312-8118 PT Time Calculation (min) (ACUTE ONLY): 12 min  Charges:  $Gait Training: 8-22 mins                         Doreatha Massed, PT Acute Rehabilitation  Office: [phone] Pager: [phone]

## 2020-10-01 NOTE — Progress Notes (Signed)
  Subjective: Patient progressing reasonably well with left total hip replacement.  Pain controlled.  Did have some pain yesterday when he was ambulating.  Foley remains in place   Objective: Vital signs in last 24 hours: Temp:  [97.7 F (36.5 C)-100 F (37.8 C)] 98.5 F (36.9 C) (03/13 1329) Pulse Rate:  [78-117] 88 (03/13 1329) Resp:  [16-18] 16 (03/13 1329) BP: (99-141)/(64-86) 130/71 (03/13 1329) SpO2:  [95 %-100 %] 99 % (03/13 1329)  Intake/Output from previous day: 03/12 0701 - 03/13 0700 In: 360 [P.O.:360] Out: 2825 [Urine:2825] Intake/Output this shift: Total I/O In: 240 [P.O.:240] Out: 900 [Urine:900]  Exam:  Dorsiflexion/Plantar flexion intact  Labs: Recent Labs    09/30/20 0304  HGB 12.1*   Recent Labs    09/30/20 0304  WBC 8.9  RBC 4.21*  HCT 37.9*  PLT 154   Recent Labs    09/30/20 0304  NA 134*  K 3.6  CL 99  CO2 28  BUN 13  CREATININE 0.78  GLUCOSE 121*  CALCIUM 8.3*   No results for input(s): LABPT, INR in the last 72 hours.  Assessment/Plan: Plan at this time is continue with therapy.  Anticipate discharge to skilled nursing next week.  Answered questions appropriately today.   Landry Dyke  10/01/2020, 1:42 PM

## 2020-10-01 NOTE — Progress Notes (Addendum)
Physical Therapy Treatment Patient Details Name: Juan Hudson MRN: [id number] DOB: 02/17/1946 Today's Date: 10/01/2020    History of Present Illness Patient is 75 y.o. male s/p Lt THA direct anterior approach on 09/29/2020 with PMH significant for OA, HTN, HLD, skin cancer, L2-4 fusion (2021), and Rt THA (2021).    PT Comments    Remains confused but participates fairly well. Progressing with mobility. Recommend 24/7 supervision/assist for safety and home health PT f/u. If pt doesn't have sufficient 24/7 care, recommend SNF for safety.   Follow Up Recommendations  Follow surgeon's recommendation for DC plan and follow-up therapies;Supervision/Assistance - 24 hour     Equipment Recommendations  None recommended by PT    Recommendations for Other Services       Precautions / Restrictions Precautions Precautions: Fall Precaution Comments: confusion Restrictions Weight Bearing Restrictions: No Other Position/Activity Restrictions: WBAT    Mobility  Bed Mobility Overal bed mobility: Needs Assistance Bed Mobility: Supine to Sit          General bed mobility comments: oob in recliner    Transfers Overall transfer level: Needs assistance Equipment used: Rolling walker (2 wheeled) Transfers: Sit to/from Stand Sit to Stand: Min assist        General transfer comment: Assist to steady. Cues for safety, hand placement.  Ambulation/Gait Ambulation/Gait assistance: Min assist Gait Distance (Feet): 250 Feet Assistive device: Rolling walker (2 wheeled) Gait Pattern/deviations: Step-to pattern;Step-through pattern;Decreased stride length;Decreased step length - right;Decreased step length - left     General Gait Details: Cues for safety, technique, RW proximity, and to increase step lengtj.  Assist to stabilize intermittently   Stairs             Wheelchair Mobility    Modified Rankin (Stroke Patients Only)       Balance Overall balance assessment:  Needs assistance Sitting-balance support: No upper extremity supported Sitting balance-Leahy Scale: Fair     Standing balance support: Bilateral upper extremity supported Standing balance-Leahy Scale: Poor Standing balance comment: mild posterior lean at sink                            Cognition Arousal/Alertness: Awake/alert Behavior During Therapy: WFL for tasks assessed/performed Overall Cognitive Status: Impaired/Different from baseline Area of Impairment: Memory                 Orientation Level: Disoriented to;Place;Time;Situation   Memory: Decreased short-term memory         General Comments: Initially didn't remember why he was in the hospital, which hospital but after verbal cue states "Oh yeah"      Exercises Total Joint Exercises Hip ABduction/ADduction: AROM;Left;10 reps;Standing Long Arc Quad: AROM;Left;10 reps;Seated Knee Flexion: AROM;Left;10 reps;Standing Marching in Standing: AROM;Both;10 reps;Standing General Exercises - Lower Extremity Heel Raises: AROM;Both;10 reps;Standing    General Comments        Pertinent Vitals/Pain Pain Assessment: Faces Faces Pain Scale: Hurts little more Pain Location: L hip Pain Descriptors / Indicators: Discomfort;Grimacing;Sore;Guarding Pain Intervention(s): Limited activity within patient's tolerance;Monitored during session;Repositioned    Home Living Family/patient expects to be discharged to:: Private residence Living Arrangements: Alone Available Help at Discharge: Friend(s);Available PRN/intermittently Type of Home: House Home Access: Stairs to enter Entrance Stairs-Rails: Right Home Layout: Two level;Able to live on main level with bedroom/bathroom Home Equipment: Gilford Rile - 2 wheels;Cane - single point;Bedside commode Additional Comments: pt plans to stay [facility] at [facility]    Prior  Function Level of Independence: Independent with assistive device(s)      Comments: use of  RW for household ambulation and cane for commmunity   PT Goals (current goals can now be found in the care plan section) Acute Rehab PT Goals Patient Stated Goal: walk and jog for exercise Progress towards PT goals: Progressing toward goals    Frequency    7X/week      PT Plan Current plan remains appropriate    Co-evaluation              AM-PAC PT "6 Clicks" Mobility   Outcome Measure  Help needed turning from your back to your side while in a flat bed without using bedrails?: A Little Help needed moving from lying on your back to sitting on the side of a flat bed without using bedrails?: A Little Help needed moving to and from a bed to a chair (including a wheelchair)?: A Little Help needed standing up from a chair using your arms (e.g., wheelchair or bedside chair)?: A Little Help needed to walk in hospital room?: A Little Help needed climbing 3-5 steps with a railing? : A Little 6 Click Score: 18    End of Session Equipment Utilized During Treatment: Gait belt Activity Tolerance: Patient tolerated treatment well Patient left: in chair;with call bell/phone within reach;with chair alarm set   PT Visit Diagnosis: Other abnormalities of gait and mobility (R26.89);Pain Pain - Right/Left: Left Pain - part of body: Hip     Time: 7628-3151 PT Time Calculation (min) (ACUTE ONLY): 15 min  Charges:  $Gait Training: 8-22 mins                         Doreatha Massed, PT Acute Rehabilitation  Office: [phone] Pager: [phone]

## 2020-10-01 NOTE — Evaluation (Signed)
Occupational Therapy Evaluation Patient Details Name: Juan Hudson MRN: [id number] DOB: 08-11-1945 Today's Date: 10/01/2020    History of Present Illness Patient is 75 y.o. male s/p Lt THA direct anterior approach on 09/29/2020 with PMH significant for OA, HTN, HLD, skin cancer, L2-4 fusion (2021), and Rt THA (2021).   Clinical Impression   Juan Hudson is a 75 year old man who presents POD 2 after L THA with decreased ROM and strength of LLE, edema in leg, pain, impaired balance and confusion. Patient mod assist to transfer to side of bed but min guard to stand and transfer with RW. Patient needing assistance for LB ADLs due to pain and difficulty moving LLE. Patient reports having a sock aide and grabber at home that he has been using since his prior surgery. He reports he has a friend Inez Catalina that is going to help him but unsure of how often she will be present. If patient's cognition improves and he has initial 24/7 assistance at home therapist recommends [facility] OT at discharge. However, if cognition does not improve and /or he doesn't have assistance would recommend short term rehab as he is a high fall risk.    Follow Up Recommendations  Home health OT;SNF    Equipment Recommendations  None recommended by OT;Tub/shower seat    Recommendations for Other Services       Precautions / Restrictions Precautions Precautions: Fall Precaution Comments: confusion Restrictions Weight Bearing Restrictions: No Other Position/Activity Restrictions: WBAT      Mobility Bed Mobility Overal bed mobility: Needs Assistance Bed Mobility: Supine to Sit     Supine to sit: Mod assist;HOB elevated     General bed mobility comments: mod assist to transfer to edge of bed - needing assistance for LLE, trunk negotiation. patinet limited by pain.    Transfers Overall transfer level: Needs assistance Equipment used: Rolling walker (2 wheeled) Transfers: Sit to/from [employer] Sit to Stand: Min guard Stand pivot transfers: Min guard       General transfer comment: min guard with RW to stand and ambulate to sink, stand from bed and recliner    Balance Overall balance assessment: Mild deficits observed, not formally tested Sitting-balance support: No upper extremity supported Sitting balance-Leahy Scale: Fair     Standing balance support: Bilateral upper extremity supported Standing balance-Leahy Scale: Poor Standing balance comment: mild posterior lean at sink                           ADL either performed or assessed with clinical judgement   ADL Overall ADL's : Needs assistance/impaired Eating/Feeding: Set up;Sitting   Grooming: Standing;Min guard;Oral care Grooming Details (indicate cue type and reason): one verbal cue to remember the task at hand Upper Body Bathing: Set up;Sitting   Lower Body Bathing: Sit to/from stand;Moderate assistance   Upper Body Dressing : Set up;Sitting   Lower Body Dressing: Maximal assistance;Sit to/from stand   Toilet Transfer: Min guard;Ambulation;Comfort height toilet;Grab bars;RW   Toileting- Clothing Manipulation and Hygiene: Minimal assistance;Sit to/from stand               Vision Baseline Vision/History: Wears glasses Wears Glasses: At all times       Perception     Praxis      Pertinent Vitals/Pain Pain Assessment: Faces Faces Pain Scale: Hurts little more Pain Location: L hip Pain Descriptors / Indicators: Discomfort;Grimacing;Sore;Restless;Guarding Pain Intervention(s): Premedicated before session;Monitored during session  Hand Dominance Right   Extremity/Trunk Assessment Upper Extremity Assessment Upper Extremity Assessment: Overall WFL for tasks assessed   Lower Extremity Assessment Lower Extremity Assessment: Defer to PT evaluation       Communication Communication Communication: No difficulties   Cognition Arousal/Alertness: Awake/alert Behavior  During Therapy: WFL for tasks assessed/performed Overall Cognitive Status: Impaired/Different from baseline Area of Impairment: Orientation;Memory                 Orientation Level: Disoriented to;Place;Time;Situation   Memory: Decreased short-term memory         General Comments: Initially didn't remember why he was in the hospital, which hospital but after verbal cue states "Oh yeah"   General Comments       Exercises     Shoulder Instructions      Home Living Family/patient expects to be discharged to:: Private residence Living Arrangements: Alone Available Help at Discharge: Friend(s);Available PRN/intermittently Type of Home: House Home Access: Stairs to enter [employer] of Steps: 3-4 Entrance Stairs-Rails: Right Home Layout: Two level;Able to live on main level with bedroom/bathroom               Home Equipment: Gilford Rile - 2 wheels;Cane - single point;Bedside commode   Additional Comments: pt plans to stay on main level at friends house      Prior Functioning/Environment Level of Independence: Independent with assistive device(s)        Comments: use of RW for household ambulation and cane for commmunity        OT Problem List: Decreased strength;Decreased range of motion;Decreased activity tolerance;Impaired balance (sitting and/or standing);Decreased safety awareness;Decreased cognition;Decreased knowledge of use of DME or AE;Increased edema;Pain      OT Treatment/Interventions: Self-care/ADL training;DME and/or AE instruction;Therapeutic activities;Balance training;Patient/family education    OT Goals(Current goals can be found in the care plan section) Acute Rehab OT Goals Patient Stated Goal: walk and jog for exercise OT Goal Formulation: With patient Time For Goal Achievement: 10/15/20 Potential to Achieve Goals: Good  OT Frequency: Min 2X/week   Barriers to D/C: Decreased caregiver support          Co-evaluation               AM-PAC OT "6 Clicks" Daily Activity     Outcome Measure Help from another person eating meals?: A Little Help from another person taking care of personal grooming?: A Little Help from another person toileting, which includes using toliet, bedpan, or urinal?: A Little Help from another person bathing (including washing, rinsing, drying)?: A Lot Help from another person to put on and taking off regular upper body clothing?: A Little Help from another person to put on and taking off regular lower body clothing?: A Lot 6 Click Score: 16   End of Session Equipment Utilized During Treatment: Rolling walker;Gait belt Nurse Communication: Mobility status  Activity Tolerance: Patient tolerated treatment well Patient left: in chair;with call bell/phone within reach;with chair alarm set  OT Visit Diagnosis: Unsteadiness on feet (R26.81);Muscle weakness (generalized) (M62.81);Other symptoms and signs involving cognitive function;Pain Pain - Right/Left: Left Pain - part of body: Hip                Time: 0742-0807 OT Time Calculation (min): 25 min Charges:  OT General Charges $OT Visit: 1 Visit OT Evaluation $OT Eval Low Complexity: 1 Low OT Treatments $Self Care/Home Management : 8-22 mins  , OTR/L [place]  Office [phone] Pager: [place]  10/01/2020, 10:27 AM

## 2020-10-02 ENCOUNTER — Encounter (HOSPITAL_COMMUNITY): Payer: Self-pay | Admitting: Orthopaedic Surgery

## 2020-10-02 NOTE — Progress Notes (Signed)
Physical Therapy Treatment Patient Details Name: Juan Hudson MRN: [id number] DOB: [birth year] Today's Date: 10/02/2020    History of Present Illness Patient is 75 y.o. male s/p Lt THA direct anterior approach on 09/29/2020 with PMH significant for OA, HTN, HLD, skin cancer, L2-4 fusion (2021), and Rt THA (2021).    PT Comments    Pt continues to have some confusion. He participated well on today. Unfortunately, he no longer has assistance available at home. Do not feel he should d/c home alone. Recommend ST rehab at SNF to improve safety with functional mobility and to regain independence prior to d/c home alone. Will continue to work with pt and progress activity as able.    Follow Up Recommendations  Follow surgeon's recommendation for DC plan and follow-up therapies;Supervision/Assistance - 24 hour (SNF)     Equipment Recommendations  None recommended by PT    Recommendations for Other Services       Precautions / Restrictions Precautions Precautions: Fall Precaution Comments: confusion Restrictions Weight Bearing Restrictions: No Other Position/Activity Restrictions: WBAT    Mobility  Bed Mobility               General bed mobility comments: oob in recliner    Transfers Overall transfer level: Needs assistance Equipment used: Rolling walker (2 wheeled) Transfers: Sit to/from Stand Sit to Stand: Min assist         General transfer comment: Small amount of assist to steady. Cues for safety, hand placement.  Ambulation/Gait Ambulation/Gait assistance: Min assist Gait Distance (Feet): 300 Feet Assistive device: Rolling walker (2 wheeled) Gait Pattern/deviations: Step-to pattern;Step-through pattern;Decreased stride length;Decreased step length - right;Decreased step length - left     General Gait Details: Cues for safety, technique, RW proximity, and to increase step length.  Assist to stabilize pt intermittently   Stairs              Wheelchair Mobility    Modified Rankin (Stroke Patients Only)       Balance Overall balance assessment: Needs assistance         Standing balance support: Bilateral upper extremity supported Standing balance-Leahy Scale: Poor                              Cognition Arousal/Alertness: Awake/alert Behavior During Therapy: WFL for tasks assessed/performed Overall Cognitive Status: Impaired/Different from baseline Area of Impairment: Memory                 Orientation Level: Disoriented to;Time   Memory: Decreased short-term memory                Exercises Total Joint Exercises Hip ABduction/ADduction: AROM;Left;10 reps;Standing Long Arc Quad: AROM;Left;10 reps;Seated Knee Flexion: AROM;Left;10 reps;Standing Marching in Standing: 10 reps;Standing;Both General Exercises - Lower Extremity Heel Raises: AROM;Both;10 reps;Standing    General Comments        Pertinent Vitals/Pain Pain Assessment: Faces Faces Pain Scale: Hurts little more Pain Location: L hip/thigh Pain Descriptors / Indicators: Discomfort;Grimacing;Sore;Guarding Pain Intervention(s): Monitored during session;Repositioned;Ice applied    Home Living                      Prior Function            PT Goals (current goals can now be found in the care plan section) Progress towards PT goals: Progressing toward goals    Frequency    7X/week      PT  Plan Current plan remains appropriate    Co-evaluation              AM-PAC PT "6 Clicks" Mobility   Outcome Measure  Help needed turning from your back to your side while in a flat bed without using bedrails?: A Little Help needed moving from lying on your back to sitting on the side of a flat bed without using bedrails?: A Little Help needed moving to and from a bed to a chair (including a wheelchair)?: A Little Help needed standing up from a chair using your arms (e.g., wheelchair or bedside chair)?: A  Little Help needed to walk in hospital room?: A Little Help needed climbing 3-5 steps with a railing? : A Little 6 Click Score: 18    End of Session Equipment Utilized During Treatment: Gait belt Activity Tolerance: Patient tolerated treatment well Patient left: in chair;with call bell/phone within reach;with chair alarm set   PT Visit Diagnosis: Other abnormalities of gait and mobility (R26.89);Pain Pain - Right/Left: Left Pain - part of body: Hip     Time: 1001-1017 PT Time Calculation (min) (ACUTE ONLY): 16 min  Charges:  $Gait Training: 8-22 mins                         Doreatha Massed, PT Acute Rehabilitation  Office: [phone] Pager: [phone]

## 2020-10-02 NOTE — NC FL2 (Signed)
[place] LEVEL OF CARE SCREENING TOOL     IDENTIFICATION  Patient Name: Juan Hudson Birthdate: [birth year] Sex: male Admission Date (Current Location): 09/29/2020  [facility] and [place] Number:  Herbalist and Address:  [facility],  [place], [place]      Provider Number: [id number]  Attending Physician Name and Address:  Mcarthur Rossetti,*  Relative Name and Phone Number:  Lanier Millon (daughter) Ph: [phone]    Current Level of Care: Hospital Recommended Level of Care: [place] Prior Approval Number:    Date Approved/Denied:   PASRR Number: [id number]  Discharge Plan: SNF    Current Diagnoses: Patient Active Problem List   Diagnosis Date Noted  . Status post total replacement of right hip 07/08/2020  . Status post total replacement of left hip 07/07/2020  . Unilateral primary osteoarthritis, left hip 05/15/2020  . Unilateral primary osteoarthritis, right hip 05/15/2020  . Lumbar stenosis with neurogenic claudication 01/11/2020  . Subacute bronchitis 02/23/2019  . Left lumbar radiculopathy 04/08/2017  . Heart murmur, systolic 12/75/1700  . Hypersomnia 10/12/2015  . Obstructive sleep apnea 02/15/2011  . Foot pain 01/16/2011  . Metatarsalgia of right foot 11/15/2010  . Loss of transverse plantar arch 11/15/2010    Orientation RESPIRATION BLADDER Height & Weight     Self,Time,Situation,Place  Normal (Uses CPAP at night) Continent Weight: 184 lb (83.5 kg) Height:  5\' 9"  (175.3 cm)  BEHAVIORAL SYMPTOMS/MOOD NEUROLOGICAL BOWEL NUTRITION STATUS      Continent Diet (Regular diet)  AMBULATORY STATUS COMMUNICATION OF NEEDS Skin   Limited Assist Verbally Surgical wounds (Surgical incision on left hip)                       Personal Care Assistance Level of Assistance  Bathing,Feeding,Dressing Bathing Assistance: Limited assistance Feeding assistance: Independent Dressing  Assistance: Limited assistance     Functional Limitations Info  Sight,Hearing,Speech Sight Info: Impaired Hearing Info: Adequate Speech Info: Adequate    SPECIAL CARE FACTORS FREQUENCY  PT (By licensed PT)     PT Frequency: 5x's/week              Contractures Contractures Info: Not present    Additional Factors Info  Code Status Code Status Info: Full             Current Medications (10/02/2020):  This is the current hospital active medication list Current Facility-Administered Medications  Medication Dose Route Frequency Provider Last Rate Last Admin  . 0.9 %  sodium chloride infusion   Intravenous Continuous Mcarthur Rossetti, MD 75 mL/hr at 09/29/20 1155 New Bag at 09/29/20 1155  . acetaminophen (TYLENOL) tablet 325-650 mg  325-650 mg Oral Q6H PRN Mcarthur Rossetti, MD   650 mg at 10/02/20 0748  . alum & mag hydroxide-simeth (MAALOX/MYLANTA) 200-200-20 MG/5ML suspension 30 mL  30 mL Oral Q4H PRN Mcarthur Rossetti, MD   30 mL at 09/30/20 1828  . ascorbic acid (VITAMIN C) tablet 500 mg  500 mg Oral Daily Mcarthur Rossetti, MD   500 mg at 10/02/20 0943  . aspirin chewable tablet 81 mg  81 mg Oral BID Mcarthur Rossetti, MD   81 mg at 10/02/20 0944  . cholecalciferol (VITAMIN D3) tablet 5,000 Units  5,000 Units Oral Daily Mcarthur Rossetti, MD   5,000 Units at 10/02/20 [phone]  . darifenacin (ENABLEX) 24 hr tablet 15 mg  15 mg Oral Daily Jean Rosenthal  Y, MD   15 mg at 10/02/20 0944  . diphenhydrAMINE (BENADRYL) 12.5 MG/5ML elixir 12.5-25 mg  12.5-25 mg Oral Q4H PRN Mcarthur Rossetti, MD      . docusate sodium (COLACE) capsule 100 mg  100 mg Oral BID Mcarthur Rossetti, MD   100 mg at 10/02/20 0944  . irbesartan (AVAPRO) tablet 300 mg  300 mg Oral Daily Mcarthur Rossetti, MD   300 mg at 10/02/20 6378   And  . hydrochlorothiazide (MICROZIDE) capsule 12.5 mg  12.5 mg Oral Daily Mcarthur Rossetti, MD   12.5 mg at  10/02/20 0944  . HYDROmorphone (DILAUDID) injection 0.5-1 mg  0.5-1 mg Intravenous Q4H PRN Mcarthur Rossetti, MD   1 mg at 09/30/20 0727  . menthol-cetylpyridinium (CEPACOL) lozenge 3 mg  1 lozenge Oral PRN Mcarthur Rossetti, MD       Or  . phenol (CHLORASEPTIC) mouth spray 1 spray  1 spray Mouth/Throat PRN Mcarthur Rossetti, MD      . methocarbamol (ROBAXIN) tablet 500 mg  500 mg Oral Q6H PRN Mcarthur Rossetti, MD   500 mg at 10/02/20 0748   Or  . methocarbamol (ROBAXIN) 500 mg in dextrose 5 % 50 mL IVPB  500 mg Intravenous Q6H PRN Mcarthur Rossetti, MD      . metoCLOPramide (REGLAN) tablet 5-10 mg  5-10 mg Oral Q8H PRN Mcarthur Rossetti, MD       Or  . metoCLOPramide (REGLAN) injection 5-10 mg  5-10 mg Intravenous Q8H PRN Mcarthur Rossetti, MD      . ondansetron [facility]) tablet 4 mg  4 mg Oral Q6H PRN Mcarthur Rossetti, MD       Or  . ondansetron [facility]) injection 4 mg  4 mg Intravenous Q6H PRN Mcarthur Rossetti, MD   4 mg at 09/30/20 0935  . oxyCODONE (Oxy IR/ROXICODONE) immediate release tablet 10-15 mg  10-15 mg Oral Q4H PRN Mcarthur Rossetti, MD   15 mg at 09/29/20 1719  . oxyCODONE (Oxy IR/ROXICODONE) immediate release tablet 5-10 mg  5-10 mg Oral Q4H PRN Mcarthur Rossetti, MD   10 mg at 09/30/20 0527  . pantoprazole (PROTONIX) EC tablet 40 mg  40 mg Oral Daily Mcarthur Rossetti, MD   40 mg at 10/02/20 0944  . pravastatin (PRAVACHOL) tablet 40 mg  40 mg Oral q1800 Mcarthur Rossetti, MD   40 mg at 10/01/20 1736  . zinc sulfate capsule 220 mg  220 mg Oral Daily Mcarthur Rossetti, MD   220 mg at 10/02/20 5885     Discharge Medications: Please see discharge summary for a list of discharge medications.  Relevant Imaging Results:  Relevant Lab Results:   Additional Information SSN: [id number]  Sherie Don, LCSW

## 2020-10-02 NOTE — Progress Notes (Signed)
Subjective: 3 Days Post-Op Procedure(s) (LRB): LEFT TOTAL HIP ARTHROPLASTY ANTERIOR APPROACH (Left) Patient reports pain as moderate.  Slow progress with PT. Patient wanting SNF placement.   Objective: Vital signs in last 24 hours: Temp:  [98 F (36.7 C)-99 F (37.2 C)] 98.2 F (36.8 C) (03/14 0543) Pulse Rate:  [85-95] 95 (03/14 0543) Resp:  [16-18] 18 (03/14 0543) BP: (99-153)/(64-82) 153/82 (03/14 0543) SpO2:  [97 %-100 %] 100 % (03/14 0543)  Intake/Output from previous day: 03/13 0701 - 03/14 0700 In: 1500.9 [P.O.:700; I.V.:800.9] Out: 2750 [Urine:2750] Intake/Output this shift: No intake/output data recorded.  Recent Labs    09/30/20 0304  HGB 12.1*   Recent Labs    09/30/20 0304  WBC 8.9  RBC 4.21*  HCT 37.9*  PLT 154   Recent Labs    09/30/20 0304  NA 134*  K 3.6  CL 99  CO2 28  BUN 13  CREATININE 0.78  GLUCOSE 121*  CALCIUM 8.3*   No results for input(s): LABPT, INR in the last 72 hours.  Left lower Extremity: Intact pulses distally Dorsiflexion/Plantar flexion intact Incision: scant drainage Compartment soft   Assessment/Plan: 3 Days Post-Op Procedure(s) (LRB): LEFT TOTAL HIP ARTHROPLASTY ANTERIOR APPROACH (Left) Up with therapy  Will start process for SNF placement.         10/02/2020, 7:32 AM

## 2020-10-02 NOTE — TOC Progression Note (Signed)
Transition of Care [facility]) - Progression Note   Patient Details  Name: Juan Hudson MRN: [id number] Date of Birth: 1945-12-12  Transition of Care [facility]) CM/SW [place], LCSW Phone Number: 10/02/2020, 1:16 PM  Clinical Narrative: Patient is requesting to go to SNF as he does not feel he can manage at home alone with only HHPT. FL2 completed; PASRR verified. Initial referral faxed out. CSW called [employer] to start insurance authorization. CSW received return phone call from Helen with [employer] stating the patient will likely not be approved for EMS transport as he is ambulating 250-300 feet. SNF authorization will also need to go to med review due to how far the patient is able to ambulate. TOC awaiting call back from [employer] to approve or deny SNF.  Expected Discharge Plan: [place] Barriers to Discharge: No Barriers Identified  Expected Discharge Plan and Services Expected Discharge Plan: [place] Discharge Planning Services: CM Consult Post Acute Care Choice: [place] arrangements for the past 2 months: Single Family Home             DME Arranged: N/A DME Agency: NA HH Arranged: PT HH Agency: [facility] [employer] Representative spoke with at [place]: pre-arranged in MD office  Readmission Risk Interventions No flowsheet data found.

## 2020-10-02 NOTE — Progress Notes (Addendum)
Physical Therapy Treatment Patient Details Name: Juan Hudson MRN: [id number] DOB: [birth year] Today's Date: 10/02/2020    History of Present Illness Patient is 75 y.o. male s/p Lt THA direct anterior approach on 09/29/2020 with PMH significant for OA, HTN, HLD, skin cancer, L2-4 fusion (2021), and Rt THA (2021).    PT Comments    Progressing with mobility. Cognition continuing to improve (he remembered exercises this afternoon). Still concerned about his ability to manage at home alone (ADLs, IADLs, etc). Continue to recommend 24/7 supervision at this time. Should pt have to return home 2* insurance denial for ST rehab, recommend [facility] home health services.  Will continue to follow and progress activity a tolerated.     Follow Up Recommendations  Follow surgeon's recommendation for DC plan and follow-up therapies;Supervision/Assistance - 24 hour     Equipment Recommendations  None recommended by PT    Recommendations for Other Services       Precautions / Restrictions Precautions Precautions: Fall Precaution Comments: mild confusion Restrictions Weight Bearing Restrictions: No Other Position/Activity Restrictions: WBAT    Mobility  Bed Mobility Overal bed mobility: Needs Assistance Bed Mobility: Supine to Sit     Supine to sit: Min assist;HOB elevated     General bed mobility comments: Assist for L LE. Increased time. Cues provided    Transfers Overall transfer level: Needs assistance Equipment used: Rolling walker (2 wheeled) Transfers: Sit to/from Stand Sit to Stand: Min guard         General transfer comment: Min guad for safety. Cues for safety, hand placement  Ambulation/Gait Ambulation/Gait assistance: Min guard Gait Distance (Feet): 300 Feet Assistive device: Rolling walker (2 wheeled) Gait Pattern/deviations: Step-to pattern;Step-through pattern;Decreased stride length     General Gait Details: Cues for safety, technique, RW proximity, and to  increase step length.  Min guard for safety.   Stairs             Wheelchair Mobility    Modified Rankin (Stroke Patients Only)       Balance Overall balance assessment: Needs assistance         Standing balance support: Bilateral upper extremity supported Standing balance-Leahy Scale: Poor                              Cognition Arousal/Alertness: Awake/alert Behavior During Therapy: WFL for tasks assessed/performed Overall Cognitive Status: Impaired/Different from baseline Area of Impairment: Memory                     Memory: Decreased short-term memory                Exercises Total Joint Exercises Hip ABduction/ADduction: AROM;Left;10 reps;Standing Knee Flexion: AROM;Left;10 reps;Standing Marching in Standing: 10 reps;Standing;Both General Exercises - Lower Extremity Heel Raises: AROM;Both;10 reps;Standing    General Comments        Pertinent Vitals/Pain Pain Assessment: Faces Faces Pain Scale: Hurts little more Pain Location: L hip/thigh Pain Descriptors / Indicators: Discomfort;Grimacing;Sore;Guarding Pain Intervention(s): Monitored during session;Repositioned;Ice applied    Home Living                      Prior Function            PT Goals (current goals can now be found in the care plan section) Progress towards PT goals: Progressing toward goals    Frequency    7X/week      PT Plan  Current plan remains appropriate    Co-evaluation              AM-PAC PT "6 Clicks" Mobility   Outcome Measure  Help needed turning from your back to your side while in a flat bed without using bedrails?: A Little Help needed moving from lying on your back to sitting on the side of a flat bed without using bedrails?: A Little Help needed moving to and from a bed to a chair (including a wheelchair)?: A Little Help needed standing up from a chair using your arms (e.g., wheelchair or bedside chair)?: A  Little Help needed to walk in hospital room?: A Little Help needed climbing 3-5 steps with a railing? : A Little 6 Click Score: 18    End of Session Equipment Utilized During Treatment: Gait belt Activity Tolerance: Patient tolerated treatment well Patient left: in chair;with call bell/phone within reach;with chair alarm set   PT Visit Diagnosis: Other abnormalities of gait and mobility (R26.89);Pain Pain - Right/Left: Left Pain - part of body: Hip     Time: 3016-0109 PT Time Calculation (min) (ACUTE ONLY): 20 min  Charges:  $Gait Training: 8-22 mins                         Doreatha Massed, PT Acute Rehabilitation  Office: [phone] Pager: [phone]

## 2020-10-03 DIAGNOSIS — M1612 Unilateral primary osteoarthritis, left hip: Principal | ICD-10-CM

## 2020-10-03 DIAGNOSIS — R4182 Altered mental status, unspecified: Secondary | ICD-10-CM

## 2020-10-03 LAB — COMPREHENSIVE METABOLIC PANEL
ALT: 18 U/L (ref 0–44)
AST: 21 U/L (ref 15–41)
Albumin: 3.1 g/dL — ABNORMAL LOW (ref 3.5–5.0)
Alkaline Phosphatase: 50 U/L (ref 38–126)
Anion gap: 6 (ref 5–15)
BUN: 18 mg/dL (ref 8–23)
CO2: 32 mmol/L (ref 22–32)
Calcium: 8.9 mg/dL (ref 8.9–10.3)
Chloride: 100 mmol/L (ref 98–111)
Creatinine, Ser: 1.04 mg/dL (ref 0.61–1.24)
GFR, Estimated: >60 mL/min (ref >60)
Glucose, Bld: 112 mg/dL — ABNORMAL HIGH (ref 70–99)
Potassium: 3.6 mmol/L (ref 3.5–5.1)
Sodium: 138 mmol/L (ref 135–145)
Total Bilirubin: 1.3 mg/dL — ABNORMAL HIGH (ref 0.3–1.2)
Total Protein: 6.2 g/dL — ABNORMAL LOW (ref 6.5–8.1)

## 2020-10-03 LAB — URINALYSIS, ROUTINE W REFLEX MICROSCOPIC
Bilirubin Urine: NEGATIVE
Glucose, UA: NEGATIVE mg/dL
Hgb urine dipstick: NEGATIVE
Ketones, ur: NEGATIVE mg/dL
Leukocytes,Ua: NEGATIVE
Nitrite: NEGATIVE
Protein, ur: NEGATIVE mg/dL
Specific Gravity, Urine: 1.014 (ref 1.005–1.030)
pH: 6 (ref 5.0–8.0)

## 2020-10-03 LAB — CBC
HCT: 38.3 % — ABNORMAL LOW (ref 39.0–52.0)
Hemoglobin: 12.3 g/dL — ABNORMAL LOW (ref 13.0–17.0)
MCH: 28.6 pg (ref 26.0–34.0)
MCHC: 32.1 g/dL (ref 30.0–36.0)
MCV: 89.1 fL (ref 80.0–100.0)
Platelets: 191 10*3/uL (ref 150–400)
RBC: 4.3 MIL/uL (ref 4.22–5.81)
RDW: 15 % (ref 11.5–15.5)
WBC: 6.5 10*3/uL (ref 4.0–10.5)
nRBC: 0 % (ref 0.0–0.2)

## 2020-10-03 NOTE — Progress Notes (Addendum)
Physical Therapy Treatment Patient Details Name: Juan Hudson MRN: [id number] DOB: 05-25-1946 Today's Date: 10/03/2020    History of Present Illness Patient is 75 y.o. male s/p Lt THA direct anterior approach on 09/29/2020 with PMH significant for OA, HTN, HLD, skin cancer, L2-4 fusion (2021), and Rt THA (2021).    PT Comments    Continues to progress well with mobility. Still mildly confused at times. Continue to recommend Supv for safety and maximized home health services should he discharge home.    Follow Up Recommendations  Follow surgeon's recommendation for DC plan and follow-up therapies;Supervision/Assistance - 24 hour     Equipment Recommendations  None recommended by PT    Recommendations for Other Services       Precautions / Restrictions Precautions Precautions: Fall Precaution Comments: mild confusion Restrictions Other Position/Activity Restrictions: WBAT    Mobility  Bed Mobility               General bed mobility comments: oob in recliner    Transfers Overall transfer level: Needs assistance Equipment used: Rolling walker (2 wheeled) Transfers: Sit to/from Stand Sit to Stand: Supervision         General transfer comment: Supv for safety  Ambulation/Gait Ambulation/Gait assistance: Supervision Gait Distance (Feet): 500 Feet Assistive device: Rolling walker (2 wheeled) Gait Pattern/deviations: Step-through pattern;Decreased stride length     General Gait Details: Supv for safety. Tolerated distance well. No c/o lightheadedness on today   Stairs Stairs: Yes Stairs assistance: Min guard Stair Management: Step to pattern;Forwards;Two rails Number of Stairs: 2 General stair comments: Min guard for safety. Cues for safety, technique, sequence.   Wheelchair Mobility    Modified Rankin (Stroke Patients Only)       Balance Overall balance assessment: Needs assistance         Standing balance support: Bilateral upper extremity  supported Standing balance-Leahy Scale: Fair                              Cognition Arousal/Alertness: Awake/alert Behavior During Therapy: WFL for tasks assessed/performed Overall Cognitive Status: No family/caregiver present to determine baseline cognitive functioning                       Memory: Decreased short-term memory         General Comments: still with mild confusion. could not remember location of room/fairly easy loss of direction      Exercises Total Joint Exercises Hip ABduction/ADduction: AROM;Left;Standing;15 reps Long Arc Quad: AROM;Left;Seated;15 reps Knee Flexion: AROM;Left;Standing;15 reps Marching in Standing: Standing;Both;15 reps General Exercises - Lower Extremity Heel Raises: AROM;Both;Standing;15 reps    General Comments        Pertinent Vitals/Pain Pain Assessment: Faces Faces Pain Scale: Hurts a little bit Pain Location: L hip/thigh Pain Descriptors / Indicators: Discomfort;Grimacing;Sore;Guarding Pain Intervention(s): Monitored during session    Home Living                      Prior Function            PT Goals (current goals can now be found in the care plan section) Progress towards PT goals: Progressing toward goals    Frequency    7X/week      PT Plan Current plan remains appropriate    Co-evaluation              AM-PAC PT "6 Clicks" Mobility  Outcome Measure  Help needed turning from your back to your side while in a flat bed without using bedrails?: A Little Help needed moving from lying on your back to sitting on the side of a flat bed without using bedrails?: A Little Help needed moving to and from a bed to a chair (including a wheelchair)?: A Little Help needed standing up from a chair using your arms (e.g., wheelchair or bedside chair)?: A Little Help needed to walk in hospital room?: A Little Help needed climbing 3-5 steps with a railing? : A Little 6 Click Score: 18     End of Session Equipment Utilized During Treatment: Gait belt Activity Tolerance: Patient tolerated treatment well Patient left: in chair;with call bell/phone within reach   PT Visit Diagnosis: Other abnormalities of gait and mobility (R26.89)     Time: 7471-5953 PT Time Calculation (min) (ACUTE ONLY): 20 min  Charges:  $Gait Training: 8-22 mins                         Doreatha Massed, PT Acute Rehabilitation  Office: [phone] Pager: [phone]

## 2020-10-03 NOTE — TOC Progression Note (Signed)
Transition of Care [facility]) - Progression Note   Patient Details  Name: Juan Hudson MRN: [id number] Date of Birth: Jul 02, 1946  Transition of Care [facility]) CM/SW [place], LCSW Phone Number: 10/03/2020, 12:54 PM  Clinical Narrative: CSW received call from [place] with [facility] stating the patient has not been approved for EMS transport due to how far he walked with PT. Hospitalist will need to do a peer-to-peer to review SNF request. Contact information for [facility] medical director provided to hospitalist and PA. Peer-to-peer completed and it was determined patient's confusion needed to improve prior to SNF. CSW reviewed bed offers with patient. Patient declined bed offers and stated he would like to discharge home. PA updated. PT to see again.  Expected Discharge Plan: [place] Barriers to Discharge: SNF Pending bed offer  Expected Discharge Plan and Services Expected Discharge Plan: [place] Discharge Planning Services: CM Consult Post Acute Care Choice: [place] arrangements for the past 2 months: Single Family Home             DME Arranged: N/A DME Agency: NA HH Arranged: PT HH Agency: [facility] [employer] Representative spoke with at [place]: pre-arranged in MD office  Readmission Risk Interventions No flowsheet data found.

## 2020-10-03 NOTE — Consult Note (Addendum)
Medical Consultation   Juan Hudson  PJK:[id number]  DOB: [birth year]  DOA: 09/29/2020  PCP: Crist Infante, MD    Requesting physician: Dr. Ninfa Linden  Reason for consultation: Altered mental status   History of Present Illness: This is a very pleasant 75 y.o. male with a past medical history of hypertension, hyperlipidemia, OSA, functional disability in the left hip due to arthritis who failed nonsurgical conservative treatments.  He was admitted to [facility] for total left hip arthroplasty with Dr. Ninfa Linden on 3/11.  He had a Foley catheter in place from surgery on 3/11, removed on 3/12 and has since had an external condom cath. Since surgery plan has been made for discharge to SNF.  On 3/12 nursing noted yellow mews score and urinary frequency,however the patient states that he has a history of overactive bladder and he follows with urology. Attributes his frequency to his overactive bladder but has been unable to ambulate to the bathroom when needed due to his surgery which requires frequently calling the nurse. He also says that he feels he reacted poorly to the anesthesia and had some confusion and acid reflux, but this has since resolved. He states that occasionally since he's been in the hospital, he has woken up in the middle of the night with nursing but was confused as to where he was for brief episodes. This morning, the patient was noted to have moderate pain, awake and alert but seemed confused and wondering if it was raining outside, however it was just windy. Internal medicine was consulted for altered mental status. Currently, he is resting in the chair at bedside, conversant, making jokes and answering questions appropriately. He feels at his mental baseline. He denies any dysuria, hematuria or any other complaints.    Review of Systems:  Review of Systems  All other systems reviewed and are negative.  As per HPI otherwise 10 point review of systems negative.      Past Medical History: Past Medical History:  Diagnosis Date  . Allergy   . Arthritis   . Cancer ([place])    skin cancer  . Cataract    early  . History of kidney stones   . HLD (hyperlipidemia)   . Hypertension   . PONV (postoperative nausea and vomiting)   . Rosacea   . Sleep apnea    cpap  . Systolic murmur     Past Surgical History: Past Surgical History:  Procedure Laterality Date  . ABDOMINAL HERNIA REPAIR     x2  . ANTERIOR LAT LUMBAR FUSION N/A 01/11/2020   Procedure: Lumbar Two-three Lumbar Three-Four Anterolateral decompression/fusion;  Surgeon: Kristeen Miss, MD;  Location: [place];  Service: Neurosurgery;  Laterality: N/A;  anterolateral  . APPLICATION OF ROBOTIC ASSISTANCE FOR SPINAL PROCEDURE N/A 01/11/2020   Procedure: APPLICATION OF ROBOTIC ASSISTANCE FOR SPINAL PROCEDURE;  Surgeon: Kristeen Miss, MD;  Location: [place];  Service: Neurosurgery;  Laterality: N/A;  posterior  . COLONOSCOPY  04-01-2003   tics and hems  . HEMORRHOID SURGERY    . INGUINAL HERNIA REPAIR    . LUMBAR PERCUTANEOUS PEDICLE SCREW 2 LEVEL N/A 01/11/2020   Procedure: Percutaneous pedicle screw fixation from Lumbar Two to Lumbar Four;  Surgeon: Kristeen Miss, MD;  Location: [place];  Service: Neurosurgery;  Laterality: N/A;  posterior  . NOSE SURGERY    . TONSILLECTOMY    . TOTAL HIP ARTHROPLASTY Right 07/07/2020   Procedure:  RIGHT TOTAL HIP ARTHROPLASTY ANTERIOR APPROACH;  Surgeon: Mcarthur Rossetti, MD;  Location: [facility];  Service: Orthopedics;  Laterality: Right;  . TOTAL HIP ARTHROPLASTY Left 09/29/2020   Procedure: LEFT TOTAL HIP ARTHROPLASTY ANTERIOR APPROACH;  Surgeon: Mcarthur Rossetti, MD;  Location: [facility];  Service: Orthopedics;  Laterality: Left;  . uvuloplasty       Allergies:   Allergies  Allergen Reactions  . Codeine Nausea Only    Tolerates oxycodone   . Doxycycline Rash    Rash on upper extremities after one dose     Social History:  reports that he quit  smoking about 56 years ago. His smoking use included cigarettes. He has never used smokeless tobacco. He reports current alcohol use of about 4.0 standard drinks of alcohol per week. He reports that he does not use drugs.   Family History: Family History  Problem Relation Age of Onset  . Heart attack Father        pacemaker  . Dementia Mother   . Colon cancer Neg Hx   . Rectal cancer Neg Hx   . Stomach cancer Neg Hx      Physical Exam: Vitals:   10/02/20 0543 10/02/20 1359 10/02/20 2126 10/03/20 0509  BP: (!) 153/82 119/70 (!) 144/84 (!) 152/83  Pulse: 95 83 82 75  Resp: 18 17 16 16   Temp: 98.2 F (36.8 C) 98.2 F (36.8 C) 97.8 F (36.6 C) 97.8 F (36.6 C)  TempSrc:  Oral Oral Oral  SpO2: 100% 98% 100% 99%  Weight:      Height:        Physical Exam Vitals and nursing note reviewed.  Constitutional:      Appearance: Normal appearance.  HENT:     Head: Normocephalic and atraumatic.  Eyes:     Conjunctiva/sclera: Conjunctivae normal.  Cardiovascular:     Rate and Rhythm: Normal rate and regular rhythm.     Heart sounds: Murmur heard.    Pulmonary:     Effort: Pulmonary effort is normal.     Breath sounds: Normal breath sounds.  Abdominal:     General: Abdomen is flat. There is no distension.     Palpations: Abdomen is soft.     Tenderness: There is no abdominal tenderness.  Musculoskeletal:        General: No swelling or tenderness.  Skin:    Coloration: Skin is not jaundiced or pale.  Neurological:     Mental Status: He is alert and oriented to person, place, and time. Mental status is at baseline.  Psychiatric:        Mood and Affect: Mood normal.        Behavior: Behavior normal.     Comments: Conversant, answering questions appropriately and making jokes      Data reviewed:  I have personally reviewed following labs and imaging studies Labs:  CBC: Recent Labs  Lab 09/30/20 0304 10/03/20 1108  WBC 8.9 6.5  HGB 12.1* 12.3*  HCT 37.9* 38.3*   MCV 90.0 89.1  PLT 154 458    Basic Metabolic Panel: Recent Labs  Lab 09/30/20 0304  NA 134*  K 3.6  CL 99  CO2 28  GLUCOSE 121*  BUN 13  CREATININE 0.78  CALCIUM 8.3*   GFR Estimated Creatinine Clearance: 79.8 mL/min (by C-G formula based on SCr of 0.78 mg/dL). Liver Function Tests: No results for input(s): AST, ALT, ALKPHOS, BILITOT, PROT, ALBUMIN in the last 168 hours. No results for input(s):  LIPASE, AMYLASE in the last 168 hours. No results for input(s): AMMONIA in the last 168 hours. Coagulation profile No results for input(s): INR, PROTIME in the last 168 hours.  Cardiac Enzymes: No results for input(s): CKTOTAL, CKMB, CKMBINDEX, TROPONINI in the last 168 hours. BNP: Invalid input(s): POCBNP CBG: No results for input(s): GLUCAP in the last 168 hours. D-Dimer No results for input(s): DDIMER in the last 72 hours. Hgb A1c No results for input(s): HGBA1C in the last 72 hours. Lipid Profile No results for input(s): CHOL, HDL, LDLCALC, TRIG, CHOLHDL, LDLDIRECT in the last 72 hours. Thyroid function studies No results for input(s): TSH, T4TOTAL, T3FREE, THYROIDAB in the last 72 hours.  Invalid input(s): FREET3 Anemia work up No results for input(s): VITAMINB12, FOLATE, FERRITIN, TIBC, IRON, RETICCTPCT in the last 72 hours. Urinalysis No results found for: COLORURINE, APPEARANCEUR, LABSPEC, [place], GLUCOSEU, [place], [place], [place], PROTEINUR, UROBILINOGEN, NITRITE, [place]   Microbiology Recent Results (from the past 240 hour(s))  Surgical pcr screen     Status: None   Collection Time: 09/25/20 11:38 AM   Specimen: Nasal Mucosa; Nasal Swab  Result Value Ref Range Status   MRSA, PCR NEGATIVE NEGATIVE Final   Staphylococcus aureus NEGATIVE NEGATIVE Final    Comment: (NOTE) The Xpert SA Assay (FDA approved for NASAL specimens in patients 36 years of age and older), is one component of a comprehensive surveillance program. It is not intended to  diagnose infection nor to guide or monitor treatment. Performed at [facility], [place] [address], [place], [place] [address]        Inpatient Medications:   Scheduled Meds: . ascorbic acid  500 mg Oral Daily  . aspirin  81 mg Oral BID  . cholecalciferol  5,000 Units Oral Daily  . darifenacin  15 mg Oral Daily  . docusate sodium  100 mg Oral BID  . irbesartan  300 mg Oral Daily   And  . hydrochlorothiazide  12.5 mg Oral Daily  . pantoprazole  40 mg Oral Daily  . pravastatin  40 mg Oral q1800  . zinc sulfate  220 mg Oral Daily   Continuous Infusions: . sodium chloride 75 mL/hr at 09/29/20 1155  . methocarbamol (ROBAXIN) IV       Radiological Exams on Admission: No results found.  Impression/Recommendations Principal Problem:   Unilateral primary osteoarthritis, left hip Active Problems:   Obstructive sleep apnea   Heart murmur, systolic   AMS (altered mental status)    1. S/p left total hip arthoplasty 09/29/20 with Dr. Ninfa Linden, POD 4 a. Per primary team  2. AMS a. Likely had slight delirium from waking up in the hospital and not at home and also receiving anesthesia and opiates, but this has since resolved. Patient is AAOx3, conversant and appropriate with no concerning features b. Will check basic lab work and UA given his urinary frequency (though this is likely his overactive bladder) c. Avoid sedating meds as much as possible  3. Hypertension, stable  4. Hyperlipidemia  5. OSA, on CPAP    Addendum: Labs overall unremarkable. TRH will sign off for now. Please do not hesitate to reach back out if any further issues. Thank you for this consult.    Time Spent: 50 minutes  Harold Hedge D.O.  [facility] 10/03/2020, 11:23 AM

## 2020-10-03 NOTE — Progress Notes (Signed)
Patient ID: Juan Hudson, male   DOB: 1945/08/18, 75 y.o.   MRN: [id number] Spoke with the patient this evening and at this point time he does not wish to go to a skilled facility.  Would like to be discharged home.  However he did state he has no one to be at home with him.  He has a friend that can pick him up and take him home.  At this point is felt that patient would benefit from home OT, PT and nurse visits.  We will set this up first thing tomorrow and then discharge him home early hopefully such that a visit can be made by home health tomorrow afternoon.

## 2020-10-03 NOTE — Progress Notes (Addendum)
Physical Therapy Treatment Patient Details Name: Juan Hudson MRN: [id number] DOB: 12-16-1945 Today's Date: 10/03/2020    History of Present Illness Patient is 75 y.o. male s/p Lt THA direct anterior approach on 09/29/2020 with PMH significant for OA, HTN, HLD, skin cancer, L2-4 fusion (2021), and Rt THA (2021).    PT Comments    Pt continues to participate well. He continues to have intermittent mild confusion. This is why I feel he needs to have some level of supervision at home for [facility], medicine management, etc. He is mobilizing well from a PT standpoint. Per chart review, it does not appear that he will be approved for ST rehab.   He wishes to d/c home. I strongly recommend TOC team maximizes home health assistance for safety. I can only state that from a mobility standpoint, he could go home.  Discussed this with RN as well.    Follow Up Recommendations  Follow surgeon's recommendation for DC plan and follow-up therapies;Supervision/Assistance - Intermittent     Equipment Recommendations  None recommended by PT    Recommendations for Other Services       Precautions / Restrictions Precautions Precautions: Fall Precaution Comments: mild confusion Restrictions Weight Bearing Restrictions: No Other Position/Activity Restrictions: WBAT    Mobility  Bed Mobility Overal bed mobility: Needs Assistance Bed Mobility: Sit to Supine       Sit to supine: Supervision   General bed mobility comments: pt was able hook his surgical leg with his good leg and assist it onto bed.    Transfers Overall transfer level: Needs assistance Equipment used: Rolling walker (2 wheeled) Transfers: Sit to/from Stand Sit to Stand: Supervision         General transfer comment: Supv for safety  Ambulation/Gait Ambulation/Gait assistance: Supervision Gait Distance (Feet): 900 Feet Assistive device: Rolling walker (2 wheeled) Gait Pattern/deviations: Step-through pattern;Decreased  stride length     General Gait Details: Supv for safety. Tolerated distance well. A little more stiff this afternoon   Stairs Stairs: Yes Stairs assistance: Min guard Stair Management: Step to pattern;Forwards;Two rails Number of Stairs: 2 General stair comments: Min guard for safety. Cues for safety, technique, sequence.   Wheelchair Mobility    Modified Rankin (Stroke Patients Only)       Balance Overall balance assessment: Needs assistance         Standing balance support: Bilateral upper extremity supported Standing balance-Leahy Scale: Fair                              Cognition Arousal/Alertness: Awake/alert Behavior During Therapy: WFL for tasks assessed/performed Overall Cognitive Status: No family/caregiver present to determine baseline cognitive functioning Area of Impairment: Memory                     Memory: Decreased short-term memory         General Comments: still with mild confusion.      Exercises Total Joint Exercises Hip ABduction/ADduction: AROM;Left;Standing;15 reps Long Arc Quad: AROM;Left;Seated;15 reps Knee Flexion: AROM;Left;Standing;15 reps Marching in Standing: Standing;Both;15 reps General Exercises - Lower Extremity Heel Raises: AROM;Both;Standing;15 reps    General Comments        Pertinent Vitals/Pain Pain Assessment: Faces Faces Pain Scale: Hurts a little bit Pain Location: L hip/thigh Pain Descriptors / Indicators: Discomfort;Grimacing;Sore;Guarding Pain Intervention(s): Monitored during session;Repositioned    Home Living  Prior Function            PT Goals (current goals can now be found in the care plan section) Progress towards PT goals: Progressing toward goals    Frequency    7X/week      PT Plan Current plan remains appropriate    Co-evaluation              AM-PAC PT "6 Clicks" Mobility   Outcome Measure  Help needed turning from  your back to your side while in a flat bed without using bedrails?: A Little Help needed moving from lying on your back to sitting on the side of a flat bed without using bedrails?: A Little Help needed moving to and from a bed to a chair (including a wheelchair)?: A Little Help needed standing up from a chair using your arms (e.g., wheelchair or bedside chair)?: A Little Help needed to walk in hospital room?: A Little Help needed climbing 3-5 steps with a railing? : A Little 6 Click Score: 18    End of Session Equipment Utilized During Treatment: Gait belt Activity Tolerance: Patient tolerated treatment well Patient left: in bed;with call bell/phone within reach   PT Visit Diagnosis: Other abnormalities of gait and mobility (R26.89) Pain - Right/Left: Left Pain - part of body: Hip     Time: 2924-4628 PT Time Calculation (min) (ACUTE ONLY): 35 min  Charges:  $Gait Training: 23-37 mins                         Doreatha Massed, PT Acute Rehabilitation  Office: [phone] Pager: [phone]

## 2020-10-03 NOTE — Progress Notes (Signed)
Subjective: 4 Days Post-Op Procedure(s) (LRB): LEFT TOTAL HIP ARTHROPLASTY ANTERIOR APPROACH (Left) Patient reports pain as moderate.  Awake and alert this AM. No complaints. Seems slightly confused wondering if it is raining outside. However is oriented to person, place and time. Still needing cues and assistance for transfers per PT. Ambulating 300 feet but needs cues for safety.   Objective: Vital signs in last 24 hours: Temp:  [97.8 F (36.6 C)-98.2 F (36.8 C)] 97.8 F (36.6 C) (03/15 0509) Pulse Rate:  [75-83] 75 (03/15 0509) Resp:  [16-17] 16 (03/15 0509) BP: (119-152)/(70-84) 152/83 (03/15 0509) SpO2:  [98 %-100 %] 99 % (03/15 0509)  Intake/Output from previous day: 03/14 0701 - 03/15 0700 In: 1320 [P.O.:1320] Out: 1700 [Urine:1700] Intake/Output this shift: No intake/output data recorded.  No results for input(s): HGB in the last 72 hours. No results for input(s): WBC, RBC, HCT, PLT in the last 72 hours. No results for input(s): NA, K, CL, CO2, BUN, CREATININE, GLUCOSE, CALCIUM in the last 72 hours. No results for input(s): LABPT, INR in the last 72 hours.  Left lower extremity: Dorsiflexion/Plantar flexion intact Incision: scant drainage Compartment soft and non tender.    Assessment/Plan: 4 Days Post-Op Procedure(s) (LRB): LEFT TOTAL HIP ARTHROPLASTY ANTERIOR APPROACH (Left) Up with therapy Slight confusion along with cues and assistance needed for ambulation patient would benefit from SNF placement. Awaiting approval for SNF placement.       10/03/2020, 8:01 AM

## 2020-10-04 NOTE — TOC Transition Note (Signed)
Transition of Care [facility]) - CM/SW Discharge Note  Patient Details  Name: Juan Hudson MRN: [id number] Date of Birth: [birth year]  Transition of Care [facility]) CM/SW Contact:  Sherie Don, LCSW Phone Number: 10/04/2020, 11:24 AM  Clinical Narrative: Patient ready for discharge. CSW received denial letters for SNF and EMS transport from [place] with [facility], which were provided to patient. Nathan with [employer] delivered 3N1 and rolling walker to patient. [facility] unable to provide RN as patient does not have a skillable need, but able to provide an aide in addition to PT and OT. [facility] to obtain new orders that include aide. Patient updated regarding [place]. TOC signing off.  Final next level of care: [place] Barriers to Discharge: Barriers Resolved  Patient Goals and CMS Choice Patient states their goals for this hospitalization and ongoing recovery are:: Go home with HHPT CMS Medicare.gov Compare Post Acute Care list provided to:: Patient Choice offered to / list presented to : Patient  Discharge Plan and Services Discharge Planning Services: CM Consult Post Acute Care Choice: Home Health          DME Arranged: 3-N-1,Walker rolling DME Agency: [employer] HH Arranged: PT,OT,Nurse's Aide [place]: [facility] [employer] Date [place]: 10/04/20 Representative spoke with at [place]: [place] in MD office  Readmission Risk Interventions No flowsheet data found.

## 2020-10-04 NOTE — Progress Notes (Signed)
Subjective: 5 Days Post-Op Procedure(s) (LRB): LEFT TOTAL HIP ARTHROPLASTY ANTERIOR APPROACH (Left) Patient reports pain as mild.  No complaints. Wanting to discharge to home.   Objective: Vital signs in last 24 hours: Temp:  [98 F (36.7 C)-98.5 F (36.9 C)] 98.3 F (36.8 C) (03/16 0548) Pulse Rate:  [81-86] 86 (03/16 0548) Resp:  [16-19] 16 (03/16 0548) BP: (122-136)/(66-83) 131/66 (03/16 0548) SpO2:  [98 %-99 %] 98 % (03/16 0548)  Intake/Output from previous day: 03/15 0701 - 03/16 0700 In: 984.8 [P.O.:900; I.V.:84.8] Out: 825 [Urine:825] Intake/Output this shift: Total I/O In: -  Out: 440 [Urine:440]  Recent Labs    10/03/20 1108  HGB 12.3*   Recent Labs    10/03/20 1108  WBC 6.5  RBC 4.30  HCT 38.3*  PLT 191   Recent Labs    10/03/20 1108  NA 138  K 3.6  CL 100  CO2 32  BUN 18  CREATININE 1.04  GLUCOSE 112*  CALCIUM 8.9   No results for input(s): LABPT, INR in the last 72 hours.  General : Awake and alert  Psych: Alert and oriented x 3.  Left lower extremity: Dorsiflexion/Plantar flexion intact Incision: dressing C/D/I Compartment soft   Assessment/Plan: 5 Days Post-Op Procedure(s) (LRB): LEFT TOTAL HIP ARTHROPLASTY ANTERIOR APPROACH (Left) Discharge home with home health PT, OT and nurse visits.  Labs negative with drop in hemoglobin and hematocrit as expected post op.        10/04/2020, 7:53 AM

## 2020-10-04 NOTE — Discharge Summary (Signed)
Patient ID: Juan Hudson MRN: [id number] DOB/AGE: Dec 03, 1945 75 y.o.  Admit date: 09/29/2020 Discharge date: 10/04/2020  Admission Diagnoses:  Principal Problem:   Unilateral primary osteoarthritis, left hip Active Problems:   Obstructive sleep apnea   Heart murmur, systolic   AMS (altered mental status)   Discharge Diagnoses:  Same  Past Medical History:  Diagnosis Date  . Allergy   . Arthritis   . Cancer ([facility])    skin cancer  . Cataract    early  . History of kidney stones   . HLD (hyperlipidemia)   . Hypertension   . PONV (postoperative nausea and vomiting)   . Rosacea   . Sleep apnea    cpap  . Systolic murmur     Surgeries: Procedure(s): LEFT TOTAL HIP ARTHROPLASTY ANTERIOR APPROACH on 09/29/2020   Consultants: Hospitalists, Social services  Discharged Condition: Improved  Hospital Course: Juan Hudson is an 75 y.o. male who was admitted 09/29/2020 for operative treatment ofUnilateral primary osteoarthritis, left hip. Patient has severe unremitting pain that affects sleep, daily activities, and work/hobbies. After pre-op clearance the patient was taken to the operating room on 09/29/2020 and underwent  Procedure(s): LEFT TOTAL HIP ARTHROPLASTY ANTERIOR APPROACH.    Patient was given perioperative antibiotics:  Anti-infectives (From admission, onward)   Start     Dose/Rate Route Frequency Ordered Stop   09/29/20 1315  ceFAZolin (ANCEF) IVPB 1 g/50 mL premix        1 g 100 mL/hr over 30 Minutes Intravenous Every 6 hours 09/29/20 1117 09/29/20 1902   09/29/20 0600  ceFAZolin (ANCEF) IVPB 2g/100 mL premix        2 g 200 mL/hr over 30 Minutes Intravenous On call to O.R. 09/29/20 0534 09/29/20 0729   09/29/20 0543  ceFAZolin (ANCEF) 2-4 GM/100ML-% IVPB       Note to Pharmacy: Mardelle Matte   : cabinet override      09/29/20 0543 09/29/20 0715       Patient was given sequential compression devices, early ambulation, and chemoprophylaxis to prevent  DVT.  Patient benefited maximally from hospital stay which was prolonged due to slow progress with PT, Altered mental status which has resolved and social issues with discharge care.    Recent vital signs:  Patient Vitals for the past 24 hrs:  BP Temp Temp src Pulse Resp SpO2  10/04/20 0548 131/66 98.3 F (36.8 C) -- 86 16 98 %  10/03/20 2144 136/83 98.5 F (36.9 C) Oral 83 18 99 %  10/03/20 1525 122/74 98 F (36.7 C) Oral 81 19 98 %     Recent laboratory studies:  Recent Labs    10/03/20 1108  WBC 6.5  HGB 12.3*  HCT 38.3*  PLT 191  NA 138  K 3.6  CL 100  CO2 32  BUN 18  CREATININE 1.04  GLUCOSE 112*  CALCIUM 8.9     Discharge Medications:   Allergies as of 10/04/2020      Reactions   Codeine Nausea Only   Tolerates oxycodone    Doxycycline Rash   Rash on upper extremities after one dose      Medication List    TAKE these medications   acetaminophen 500 MG tablet Commonly known as: TYLENOL Take 1,000 mg by mouth daily as needed for moderate pain.   ascorbic acid 500 MG tablet Commonly known as: VITAMIN C Take 500 mg by mouth daily.   aspirin 81 MG chewable tablet Chew 1 tablet (81  mg total) by mouth 2 (two) times daily. What changed: when to take this   B COMPLEX 100 PO Take 1 tablet by mouth daily.   calcium carbonate 750 MG chewable tablet Commonly known as: TUMS EX Chew 375 mg by mouth daily as needed for heartburn.   Centrum tablet Take 1 tablet by mouth daily.   CoQ10 200 MG Caps Take 200 mg by mouth daily.   Fish Oil 1000 MG Caps Take 1,000 mg by mouth daily.   irbesartan-hydrochlorothiazide 300-12.5 MG tablet Commonly known as: AVALIDE Take 1 tablet by mouth at bedtime.   methocarbamol 500 MG tablet Commonly known as: ROBAXIN Take 1 tablet (500 mg total) by mouth every 6 (six) hours as needed for muscle spasms.   naproxen sodium 220 MG tablet Commonly known as: ALEVE Take 220 mg by mouth daily as needed (pain).    ondansetron 4 MG disintegrating tablet Commonly known as: Zofran ODT Take 1 tablet (4 mg total) by mouth every 8 (eight) hours as needed for nausea or vomiting.   oxyCODONE 5 MG immediate release tablet Commonly known as: Oxy IR/ROXICODONE Take 1-2 tablets (5-10 mg total) by mouth every 4 (four) hours as needed for moderate pain (pain score 4-6).   pravastatin 40 MG tablet Commonly known as: PRAVACHOL Take 1 tablet (40 mg total) by mouth daily.   solifenacin 10 MG tablet Commonly known as: VESICARE Take 10 mg by mouth daily.   testosterone cypionate 200 MG/ML injection Commonly known as: DEPOTESTOSTERONE CYPIONATE Inject 0.5 mLs (100 mg total) into the muscle every Saturday. What changed: how much to take   Vitamin D 125 MCG (5000 UT) Caps Take 5,000 Units by mouth daily.   Zinc 25 MG Tabs Take 25 mg by mouth daily.            Durable Medical Equipment  (From admission, onward)         Start     Ordered   09/29/20 1118  DME 3 n 1  Once        09/29/20 1117   09/29/20 1118  DME Walker rolling  Once       Question Answer Comment  Walker: With 5 Inch Wheels   Patient needs a walker to treat with the following condition Status post total replacement of left hip      09/29/20 1117          Diagnostic Studies: DG Pelvis Portable  Result Date: 09/29/2020 CLINICAL DATA:  75 year old male status post anterior left hip replacement. EXAM: PORTABLE PELVIS 1-2 VIEWS COMPARISON:  Intraoperative images 0735 hours today. FINDINGS: Portable AP supine view at 0840 hours. Pre-existing right hip arthroplasty. New left hip bipolar arthroplasty. Hardware appears intact with normal AP alignment. No unexpected osseous changes. Overlying skin staples, mild regional soft tissue gas. IMPRESSION: 1. New left hip bipolar arthroplasty with no adverse features. 2. Pre-existing right hip arthroplasty. Electronically Signed   By: Genevie Ann M.D.   On: 09/29/2020 10:24   DG C-Arm 1-60 Min-No  Report  Result Date: 09/29/2020 Fluoroscopy was utilized by the requesting physician.  No radiographic interpretation.   DG HIP OPERATIVE UNILAT W OR W/O PELVIS LEFT  Result Date: 09/29/2020 CLINICAL DATA:  75 year old male undergoing anterior left hip replacement. EXAM: OPERATIVE LEFT HIP (WITH PELVIS IF PERFORMED) 3 VIEWS TECHNIQUE: Fluoroscopic spot image(s) were submitted for interpretation post-operatively. COMPARISON:  pelvis 07/07/2020. FINDINGS: Three intraoperative fluoroscopic spot views including 2 of the left hip and 1 of the  lower pelvis. Pre-existing right hip arthroplasty. New left hip bipolar arthroplasty with hardware appearing intact and normally aligned. IMPRESSION: New left hip bipolar arthroplasty with no adverse features. Electronically Signed   By: Genevie Ann M.D.   On: 09/29/2020 10:21    Disposition: Discharge disposition: 06-Home-Health Care Svc          Follow-up Information    Mcarthur Rossetti, MD Follow up in 2 week(s).   Specialty: Orthopedic Surgery Contact information: [place] [address] [address], [place] Follow up.   Specialty: [facility] Why: agency will provide home health therapy Contact information: [address] [place] [address] [phone]                Signed: Erskine Emery 10/04/2020, 7:53 AM

## 2020-10-04 NOTE — Plan of Care (Signed)
  Problem: Health Behavior/Discharge Planning: Goal: Ability to manage health-related needs will improve Outcome: Progressing   Problem: Activity: Goal: Risk for activity intolerance will decrease Outcome: Progressing   Problem: Pain Managment: Goal: General experience of comfort will improve Outcome: Progressing   

## 2020-10-04 NOTE — Progress Notes (Signed)
Occupational Therapy Treatment Patient Details Name: Juan Hudson MRN: [id number] DOB: 12/14/1945 Today's Date: 10/04/2020    History of present illness Patient is 75 y.o. male s/p Lt THA direct anterior approach on 09/29/2020 with PMH significant for OA, HTN, HLD, skin cancer, L2-4 fusion (2021), and Rt THA (2021).   OT comments  Patient's cognition improved today compared to this therapist's last treatment. Patient oriented to self and situation. Reports frustration at not being discharged home yet. Reports Inez Catalina isn't able to help him now but he has a ride hoe and someone to stay with him tonight. Patient answered phone call in room from a friend who said he would be available Friday, Saturday and Sunday if needed. Today patient demonstrated ability to transfer into sitting in bed with modified independence using bed rail and use of RLE to help LLE. Patient supervision for sit to stand and ambulation to sink and recliner. Verbal cue to push up with arms instead of pulling on walker. Patient demonstrated ability to use AE for lower body dressing needing only min assist for donning right shoe due to collapsed heel despite. Shoes more difficult today due to edema in feet. Patient reports having BSC to over over toilet at home and wanting another one for shower. Therapist recommended shower chair and purchasing one on the way home. Patient reports having "a bunch of frozen food at home" for meals. Patient off handedly stated "I need a wife." Therapist recommended personal aide to assist some during the day as an alternative. Appear plan is to return home at discharge. Recommend [facility] services at discharge.    Follow Up Recommendations  Home health OT;SNF    Equipment Recommendations  Tub/shower seat    Recommendations for Other Services      Precautions / Restrictions Precautions Precautions: Fall Restrictions Weight Bearing Restrictions: No Other Position/Activity Restrictions: WBAT        Mobility Bed Mobility         Supine to sit: Supervision     General bed mobility comments: supervision to transfer to side of bed. Used RLE to assist LLE.    Transfers Overall transfer level: Needs assistance Equipment used: Rolling walker (2 wheeled) Transfers: Sit to/from Stand Sit to Stand: Supervision Stand pivot transfers: Supervision       General transfer comment: Supervison for mobility in room including standing at sink.    Balance Overall balance assessment: Mild deficits observed, not formally tested                                         ADL either performed or assessed with clinical judgement   ADL       Grooming: Supervision/safety;Oral care;Standing Grooming Details (indicate cue type and reason): standing at sink         Upper Body Dressing : Set up Upper Body Dressing Details (indicate cue type and reason): donned shirt at edge of bed Lower Body Dressing: Minimal assistance;Set up;Sit to/from stand Lower Body Dressing Details (indicate cue type and reason): Able to don underwear, pants, socks and shoes. Min assist to get heel of shoe up that collapsed. Uses reacher, sock aide and shoe horn. Increased edeman in feet making task more difficult.             Functional mobility during ADLs: Supervision/safety;Rolling walker       Vision Patient Visual Report: No change from  baseline     Perception     Praxis      Cognition Arousal/Alertness: Awake/alert Behavior During Therapy: WFL for tasks assessed/performed Overall Cognitive Status: Within Functional Limits for tasks assessed                                 General Comments: improved orientation today.        Exercises     Shoulder Instructions       General Comments      Pertinent Vitals/ Pain       Pain Assessment: Faces Faces Pain Scale: Hurts a little bit Pain Location: L hip/thigh Pain Descriptors / Indicators:  Discomfort;Grimacing;Sore;Guarding Pain Intervention(s): Monitored during session  Home Living                                          Prior Functioning/Environment              Frequency  Min 2X/week        Progress Toward Goals  OT Goals(current goals can now be found in the care plan section)  Progress towards OT goals: Progressing toward goals  Acute Rehab OT Goals Patient Stated Goal: walk and jog for exercise OT Goal Formulation: With patient Time For Goal Achievement: 10/15/20 Potential to Achieve Goals: Good  Plan Discharge plan remains appropriate    Co-evaluation                 AM-PAC OT "6 Clicks" Daily Activity     Outcome Measure   Help from another person eating meals?: None Help from another person taking care of personal grooming?: None Help from another person toileting, which includes using toliet, bedpan, or urinal?: None Help from another person bathing (including washing, rinsing, drying)?: A Little Help from another person to put on and taking off regular upper body clothing?: None Help from another person to put on and taking off regular lower body clothing?: A Little 6 Click Score: 22    End of Session Equipment Utilized During Treatment: Rolling walker  OT Visit Diagnosis: Unsteadiness on feet (R26.81);Muscle weakness (generalized) (M62.81);Other symptoms and signs involving cognitive function;Pain Pain - Right/Left: Left Pain - part of body: Hip   Activity Tolerance Patient tolerated treatment well   Patient Left in chair;with call bell/phone within reach;with chair alarm set   Nurse Communication Mobility status        Time: 0539-7673 OT Time Calculation (min): 25 min  Charges: OT General Charges $OT Visit: 1 Visit OT Treatments $Self Care/Home Management : 23-37 mins  , OTR/L [place]  Office [phone] Pager: [place] 10/04/2020, 9:45  AM

## 2020-10-04 NOTE — Progress Notes (Signed)
Physical Therapy Treatment Patient Details Name: Juan Hudson MRN: [id number] DOB: 1946-07-19 Today's Date: 10/04/2020    History of Present Illness Patient is 75 y.o. male s/p Lt THA direct anterior approach on 09/29/2020 with PMH significant for OA, HTN, HLD, skin cancer, L2-4 fusion (2021), and Rt THA (2021).    PT Comments    [name] is set to d/c home on today. Continue to recommend intermittent supervision. All education completed.    Follow Up Recommendations  Follow surgeon's recommendation for DC plan and follow-up therapies;Supervision - Intermittent     Equipment Recommendations  None recommended by PT    Recommendations for Other Services       Precautions / Restrictions Precautions Precautions: Fall Restrictions Weight Bearing Restrictions: No Other Position/Activity Restrictions: WBAT    Mobility  Bed Mobility         Supine to sit: Supervision     General bed mobility comments: oob in recliner    Transfers Overall transfer level: Needs assistance Equipment used: Rolling walker (2 wheeled) Transfers: Sit to/from Stand Sit to Stand: Supervision Stand pivot transfers: Supervision       General transfer comment: Supv for safety  Ambulation/Gait Ambulation/Gait assistance: Supervision Gait Distance (Feet): 500 Feet Assistive device: Rolling walker (2 wheeled) Gait Pattern/deviations: Step-through pattern;Decreased stride length     General Gait Details: Supv for safety   Stairs             Wheelchair Mobility    Modified Rankin (Stroke Patients Only)       Balance Overall balance assessment: Mild deficits observed, not formally tested         Standing balance support: Bilateral upper extremity supported Standing balance-Leahy Scale: Fair                              Cognition Arousal/Alertness: Awake/alert Behavior During Therapy: WFL for tasks assessed/performed Overall Cognitive Status: No  family/caregiver present to determine baseline cognitive functioning                                 General Comments: cognition continues to improve      Exercises Total Joint Exercises Ankle Circles/Pumps: AROM;Both;10 reps Quad Sets: AROM;Both;10 reps Heel Slides: AAROM;Left;10 reps;Seated Hip ABduction/ADduction: Left;AAROM;Seated;10 reps;AROM;20 reps;Standing (10 reps partially reclined; 20 reps standing) Long Arc Quad: AROM;Left;Seated;20 reps Knee Flexion: AROM;Left;Standing;20 reps Marching in Standing: Standing;Both;20 reps General Exercises - Lower Extremity Heel Raises: AROM;Both;20 reps;Standing    General Comments        Pertinent Vitals/Pain Pain Assessment: 0-10 Pain Score: 2  Faces Pain Scale: Hurts a little bit Pain Location: L hip/thigh Pain Descriptors / Indicators: Discomfort;Grimacing;Sore;Guarding Pain Intervention(s): Monitored during session    Home Living                      Prior Function            PT Goals (current goals can now be found in the care plan section) Acute Rehab PT Goals Patient Stated Goal: walk and jog for exercise Progress towards PT goals: Progressing toward goals    Frequency    7X/week      PT Plan Current plan remains appropriate    Co-evaluation              AM-PAC PT "6 Clicks" Mobility   Outcome Measure  Help needed  turning from your back to your side while in a flat bed without using bedrails?: A Little Help needed moving from lying on your back to sitting on the side of a flat bed without using bedrails?: A Little Help needed moving to and from a bed to a chair (including a wheelchair)?: A Little Help needed standing up from a chair using your arms (e.g., wheelchair or bedside chair)?: A Little Help needed to walk in hospital room?: A Little Help needed climbing 3-5 steps with a railing? : A Little 6 Click Score: 18    End of Session Equipment Utilized During Treatment:  Gait belt Activity Tolerance: Patient tolerated treatment well Patient left: in chair;with call bell/phone within reach   PT Visit Diagnosis: Other abnormalities of gait and mobility (R26.89) Pain - Right/Left: Left Pain - part of body: Hip     Time: 0722-5750 PT Time Calculation (min) (ACUTE ONLY): 22 min  Charges:  $Gait Training: 8-22 mins                        Doreatha Massed, PT Acute Rehabilitation  Office: [phone] Pager: [phone]

## 2020-10-04 NOTE — Progress Notes (Signed)
[facility] office RNCM requested to assist with ensuring [facility] orders for patient due to discharge home today. Spoke with [facility] ([facility]) liaison and orders for HHPT, OT, as well as home health aide provided at request of Benita Stabile, PA-C for Dr. Ninfa Linden. Jamse Arn, [place].

## 2020-10-10 DIAGNOSIS — D0461 Carcinoma in situ of skin of right upper limb, including shoulder: Secondary | ICD-10-CM | POA: Diagnosis not present

## 2020-10-12 ENCOUNTER — Encounter: Payer: Self-pay | Admitting: Orthopaedic Surgery

## 2020-10-12 ENCOUNTER — Ambulatory Visit (INDEPENDENT_AMBULATORY_CARE_PROVIDER_SITE_OTHER): Payer: PPO | Admitting: Orthopaedic Surgery

## 2020-10-12 DIAGNOSIS — Z96642 Presence of left artificial hip joint: Secondary | ICD-10-CM

## 2020-10-12 NOTE — Progress Notes (Signed)
The patient is 2 weeks tomorrow status post a left total hip arthroplasty.  We replaced his right hip a few months ago.  He is doing well overall and said he has had the best 3 days he has had no other year.  Examination of his left hip shows it looks good.  The staples have been removed and Steri-Strips applied.  There is a moderate seroma and I did aspirate about 35 cc of fluid from the soft tissues.  His leg lengths look good overall.  There is no evidence of infection.  He is mobilizing well.  He will continue to increase his activities as comfort allows.  I would like to see him back in 4 weeks for repeat exam but no x-rays are needed.

## 2020-10-26 ENCOUNTER — Telehealth: Payer: Self-pay | Admitting: Orthopaedic Surgery

## 2020-10-26 ENCOUNTER — Telehealth: Payer: Self-pay

## 2020-10-26 ENCOUNTER — Other Ambulatory Visit: Payer: Self-pay

## 2020-10-26 DIAGNOSIS — Z96642 Presence of left artificial hip joint: Secondary | ICD-10-CM

## 2020-10-26 NOTE — Telephone Encounter (Signed)
Patient called he is requesting a referral to be sent to our pt department he stated the therapy facility he was referred to is booked out call back:

## 2020-10-26 NOTE — Telephone Encounter (Signed)
Patient called. He would like a referral for PT to go to [facility].

## 2020-10-26 NOTE — Telephone Encounter (Signed)
Can we just have him back here?

## 2020-10-26 NOTE — Telephone Encounter (Signed)
Referral sent 

## 2020-10-27 ENCOUNTER — Encounter: Payer: Self-pay | Admitting: Rehabilitative and Restorative Service Providers"

## 2020-10-27 ENCOUNTER — Other Ambulatory Visit: Payer: Self-pay

## 2020-10-27 ENCOUNTER — Ambulatory Visit (INDEPENDENT_AMBULATORY_CARE_PROVIDER_SITE_OTHER): Payer: PPO | Admitting: Rehabilitative and Restorative Service Providers"

## 2020-10-27 DIAGNOSIS — M25552 Pain in left hip: Secondary | ICD-10-CM | POA: Diagnosis not present

## 2020-10-27 DIAGNOSIS — M6281 Muscle weakness (generalized): Secondary | ICD-10-CM | POA: Diagnosis not present

## 2020-10-27 DIAGNOSIS — R262 Difficulty in walking, not elsewhere classified: Secondary | ICD-10-CM

## 2020-10-27 NOTE — Patient Instructions (Signed)
Access Code: [email or web address] URL: [email or web address] Date: 10/27/2020 Prepared by: Scot Jun  Exercises Clamshell - 2 x daily - 7 x weekly - 3 sets - 10 reps Supine Bridge - 2 x daily - 7 x weekly - 3 sets - 10 reps - 2 hold Seated Straight Leg Heel Taps - 1-2 x daily - 7 x weekly - 3 sets - 10 reps Standing Single Leg Stance with Counter Support - 1 x daily - 7 x weekly - 1 sets - 5 reps - 20-30 hold Sit to Stand - 1 x daily - 7 x weekly - 3 sets - 10 reps

## 2020-10-27 NOTE — Therapy (Signed)
[facility] Physical Therapy [address] [place], [place], [address] Phone: [phone]   Fax:  [phone]  Physical Therapy Evaluation  Patient Details  Name: Juan Hudson MRN: [id number] Date of Birth: 10-Oct-1945 Referring Provider (PT): Lt THA Anterior   Encounter Date: 10/27/2020   PT End of Session - 10/27/20 5329    Visit Number 1    Number of Visits 12    Date for PT Re-Evaluation 12/22/20    Progress Note Due on Visit 10    PT Start Time 0930    PT Stop Time 1010    PT Time Calculation (min) 40 min    Activity Tolerance Patient tolerated treatment well    Behavior During Therapy [facility] for tasks assessed/performed           Past Medical History:  Diagnosis Date  . Allergy   . Arthritis   . Cancer ([place])    skin cancer  . Cataract    early  . History of kidney stones   . HLD (hyperlipidemia)   . Hypertension   . PONV (postoperative nausea and vomiting)   . Rosacea   . Sleep apnea    cpap  . Systolic murmur     Past Surgical History:  Procedure Laterality Date  . ABDOMINAL HERNIA REPAIR     x2  . ANTERIOR LAT LUMBAR FUSION N/A 01/11/2020   Procedure: Lumbar Two-three Lumbar Three-Four Anterolateral decompression/fusion;  Surgeon: Kristeen Miss, MD;  Location: [place];  Service: Neurosurgery;  Laterality: N/A;  anterolateral  . APPLICATION OF ROBOTIC ASSISTANCE FOR SPINAL PROCEDURE N/A 01/11/2020   Procedure: APPLICATION OF ROBOTIC ASSISTANCE FOR SPINAL PROCEDURE;  Surgeon: Kristeen Miss, MD;  Location: [place];  Service: Neurosurgery;  Laterality: N/A;  posterior  . COLONOSCOPY  04-01-2003   tics and hems  . HEMORRHOID SURGERY    . INGUINAL HERNIA REPAIR    . LUMBAR PERCUTANEOUS PEDICLE SCREW 2 LEVEL N/A 01/11/2020   Procedure: Percutaneous pedicle screw fixation from Lumbar Two to Lumbar Four;  Surgeon: Kristeen Miss, MD;  Location: [place];  Service: Neurosurgery;  Laterality: N/A;  posterior  . NOSE SURGERY    . TONSILLECTOMY    . TOTAL  HIP ARTHROPLASTY Right 07/07/2020   Procedure: RIGHT TOTAL HIP ARTHROPLASTY ANTERIOR APPROACH;  Surgeon: Mcarthur Rossetti, MD;  [facility];  Service: Orthopedics;  Laterality: Right;  . TOTAL HIP ARTHROPLASTY Left 09/29/2020   Procedure: LEFT TOTAL HIP ARTHROPLASTY ANTERIOR APPROACH;  Surgeon: Mcarthur Rossetti, MD;  Location: [facility];  Service: Orthopedics;  Laterality: Left;  . uvuloplasty      There were no vitals filed for this visit.    Subjective Assessment - 10/27/20 0938    Subjective Lt THA anterior 09/29/2020.   Pt. stated walking prolonged gives some symptoms. No trouble sleeping due to symptoms.    Pertinent History HTN, Hyperlipidemia, history of skin cancer  subacute of L1 fx. s/p ALIF at L2-3 and L3-4 and PLIF at L2-L4. PMHx significant for skin CA, HTN, and HLD.    Limitations Walking;Standing    Patient Stated Goals Reduce pain, walk 5 miles for walking group, ride bicycle 20 mins    Currently in Pain? Yes    Pain Score 0-No pain   2/10 at worst   Pain Orientation Left;Anterior    Pain Descriptors / Indicators Sore    Pain Type Surgical pain    Pain Onset 1 to 4 weeks ago    Pain Frequency Occasional  Aggravating Factors  walking prolonged, stiffness after sitting    Pain Relieving Factors rest, changing positioning from sitting    Effect of Pain on Daily Activities Walking, biking              [facility] PT Assessment - 10/27/20 0001      Assessment   Medical Diagnosis Dr. Ninfa Linden    Referring Provider (PT) Lt THA Anterior    Onset Date/Surgical Date 09/29/20    Hand Dominance Left      Precautions   Precautions Anterior Hip      Restrictions   Weight Bearing Restrictions No      Balance Screen   Has the patient fallen in the past 6 months No    Has the patient had a decrease in activity level because of a fear of falling?  No    Is the patient reluctant to leave their home because of a fear of falling?  No      Home Environment    Living Environment Private residence    Additional Comments 15 stairs to top floor c rail, 4 to enter house      Prior Function   Level of Independence Independent    Leisure Walking 5 miles 6 days/week, biking a few times.      Cognition   Overall Cognitive Status Within Functional Limits for tasks assessed      Observation/Other Assessments   Focus on Therapeutic Outcomes (FOTO)  intake 59%, expected outcome 73%      Functional Tests   Functional tests Sit to Stand;Single leg stance      Single Leg Stance   Comments < 3 seconds on Rt LE, unable unassisted by HHA on Lt      Sit to Stand   Comments able to perform from 18 inch chair s UE assist      ROM / Strength   AROM / PROM / Strength AROM;Strength;PROM      AROM   AROM Assessment Site Hip    Right/Left Hip Left;Right    Right Hip Flexion 115    Left Hip Flexion 100      PROM   Overall PROM Comments Tightness noted in end ranges listed below    PROM Assessment Site Hip    Right/Left Hip Left;Right    Right Hip Flexion 120    Right Hip External Rotation  45    Right Hip Internal Rotation  25    Left Hip Flexion 100    Left Hip External Rotation  50    Left Hip Internal Rotation  25      Strength   Overall Strength Comments Dynamometry in sitting MMT positioning as listed below    Strength Assessment Site Hip;Knee;Ankle    Right/Left Hip Left;Right    Right Hip Flexion 5/5   28, 23.7 lbs   Left Hip Flexion 5/5   27, 20 lbs   Right/Left Knee Left;Right    Right Knee Flexion 5/5    Right Knee Extension 5/5   65, 71 lbs   Left Knee Flexion 5/5    Left Knee Extension 4/5   40.3, 37.7 lbs   Right/Left Ankle Left;Right    Right Ankle Dorsiflexion 5/5    Left Ankle Dorsiflexion 5/5      Transfers   Five time sit to stand comments  13.5 seconds      Ambulation/Gait   Ambulation/Gait Yes    Ambulation/Gait Assistance 7: Independent    Gait  Comments Reduced step length, hip extension on Lt in terminal stance,  reduced stance on Lt LE.                      Objective measurements completed on examination: See above findings.       [facility] Adult PT Treatment/Exercise - 10/27/20 0001      Exercises   Exercises Other Exercises    Other Exercises  HEP instruction/performance c cues for techniques, handout provided.  Trial set performed of each for comprehension and symptom assessment.  Consisting of supine bridge, sidelying clam shell, seated SLR, SLS at counter, sit to stand eccentric focus                  PT Education - 10/27/20 0928    Education Details HEP, POC    Person(s) Educated Patient    Methods Explanation;Verbal cues;Handout;Demonstration    Comprehension Returned demonstration;Verbalized understanding            PT Short Term Goals - 10/27/20 0928      PT SHORT TERM GOAL #1   Title Patient will demonstrate independent use of home exercise program to maintain progress from in clinic treatments. 3    Time 3    Period Weeks    Status New    Target Date 11/17/20             PT Long Term Goals - 10/27/20 0929      PT LONG TERM GOAL #1   Title Patient will demonstrate/report pain at worst less than or equal to 2/10 to facilitate minimal limitation in daily activity secondary to pain symptoms.    Time 10    Period Weeks    Status New    Target Date 12/22/20      PT LONG TERM GOAL #2   Title Patient will demonstrate independent use of home exercise program to facilitate ability to maintain/progress functional gains from skilled physical therapy services. 8    Time 8    Period Weeks    Status New    Target Date 12/22/20      PT LONG TERM GOAL #3   Title Pt. will demonstrate FOTO outcome > or = 73 to indicated reduced disability.    Time 8    Period Weeks    Status New    Target Date 12/22/20      PT LONG TERM GOAL #4   Title Pt. will demonstrate Lt hip MMT 4+ or greater, knee ext Lt 5/5 , dynamometry with 15% of Rt to facilitate stabilty in  ambulation.    Time 8    Period Weeks    Status New    Target Date 12/22/20      PT LONG TERM GOAL #5   Title Pt. will demonstrate bilateral SLS > 15 seconds to facilitate stability in ambulation at PLOF.    Time 8    Period Weeks    Status New    Target Date 12/22/20      Additional Long Term Goals   Additional Long Term Goals Yes      PT LONG TERM GOAL #6   Title Pt. will indicate ability to walk and bike at PLOF s limitation.    Time 8    Period Weeks    Status New    Target Date 12/22/20                  Plan - 10/27/20 0930  Clinical Impression Statement Patient is a 75 y.o. who comes to clinic with complaints of Lt hip pain s/p recent anterior THA 09/29/2020 with mobility, strength and movement coordination deficits that impair their ability to perform usual daily and recreational functional activities without increase difficulty/symptoms at this time.  Patient to benefit from skilled PT services to address impairments and limitations to improve to previous level of function without restriction secondary to condition.    Personal Factors and Comorbidities Comorbidity 3+    Comorbidities HTN, Hyperlipidemia, history of skin cancer, history  L1 fx. s/p ALIF at L2-3 and L3-4 and PLIF at L2-L4    Examination-Activity Limitations Squat;Sit;Stairs;Stand;Locomotion Level;Transfers    Examination-Participation Restrictions Community Activity;Shop;Yard Work;Other   exercise routine   Stability/Clinical Decision Making Stable/Uncomplicated    Clinical Decision Making Low    Rehab Potential Good    PT Frequency --   1-2x/week   PT Duration 8 weeks    PT Treatment/Interventions ADLs/Self Care Home Management;Cryotherapy;Electrical Stimulation;Iontophoresis 4mg /ml Dexamethasone;Moist Heat;Balance training;Therapeutic exercise;Therapeutic activities;Functional mobility training;Stair training;Gait training;DME Instruction;Ultrasound;Neuromuscular re-education;Patient/family  education;Dry needling;Joint Manipulations;Passive range of motion;Taping;Manual techniques    PT Next Visit Plan Review HEP, quad strength in SLR, leg press, stair control.  Static balance intervention.    PT Home Exercise Plan [id number]    Consulted and Agree with Plan of Care Patient           Patient will benefit from skilled therapeutic intervention in order to improve the following deficits and impairments:  Abnormal gait,Decreased endurance,Hypomobility,Pain,Decreased strength,Decreased activity tolerance,Decreased balance,Decreased mobility,Difficulty walking,Improper body mechanics,Impaired perceived functional ability,Impaired flexibility,Decreased coordination,Decreased range of motion  Visit Diagnosis: Pain in left hip  Muscle weakness (generalized)  Difficulty in walking, not elsewhere classified     Problem List Patient Active Problem List   Diagnosis Date Noted  . AMS (altered mental status) 10/03/2020  . Status post total replacement of right hip 07/08/2020  . Status post total replacement of left hip 07/07/2020  . Unilateral primary osteoarthritis, left hip 05/15/2020  . Unilateral primary osteoarthritis, right hip 05/15/2020  . Lumbar stenosis with neurogenic claudication 01/11/2020  . Subacute bronchitis 02/23/2019  . Left lumbar radiculopathy 04/08/2017  . Heart murmur, systolic 37/90/2409  . Hypersomnia 10/12/2015  . Obstructive sleep apnea 02/15/2011  . Foot pain 01/16/2011  . Metatarsalgia of right foot 11/15/2010  . Loss of transverse plantar arch 11/15/2010    Scot Jun, PT, DPT, OCS, ATC 10/27/20  10:47 AM    [facility] Physical Therapy [address] [place], [place], [address] Phone: [phone]   Fax:  [phone]  Name: Juan Hudson MRN: [id number] Date of Birth: 02/11/1946

## 2020-10-30 ENCOUNTER — Other Ambulatory Visit: Payer: Self-pay

## 2020-10-30 ENCOUNTER — Ambulatory Visit (INDEPENDENT_AMBULATORY_CARE_PROVIDER_SITE_OTHER): Payer: PPO | Admitting: Physical Therapy

## 2020-10-30 ENCOUNTER — Encounter: Payer: Self-pay | Admitting: Physical Therapy

## 2020-10-30 DIAGNOSIS — R262 Difficulty in walking, not elsewhere classified: Secondary | ICD-10-CM

## 2020-10-30 DIAGNOSIS — M25552 Pain in left hip: Secondary | ICD-10-CM | POA: Diagnosis not present

## 2020-10-30 DIAGNOSIS — M6281 Muscle weakness (generalized): Secondary | ICD-10-CM

## 2020-10-30 NOTE — Therapy (Signed)
[facility] Physical Therapy [address] [place], [place], [address] Phone: [phone]   Fax:  [phone]  Physical Therapy Treatment  Patient Details  Name: Juan Hudson MRN: [id number] Date of Birth: 31-Aug-1945 Referring Provider (PT): Lt THA Anterior   Encounter Date: 10/30/2020   PT End of Session - 10/30/20 0940    Visit Number 2    Date for PT Re-Evaluation 12/22/20    Progress Note Due on Visit 10    PT Start Time 0933    PT Stop Time 1015    PT Time Calculation (min) 42 min    Activity Tolerance Patient tolerated treatment well    Behavior During Therapy [facility] for tasks assessed/performed           Past Medical History:  Diagnosis Date  . Allergy   . Arthritis   . Cancer ([place])    skin cancer  . Cataract    early  . History of kidney stones   . HLD (hyperlipidemia)   . Hypertension   . PONV (postoperative nausea and vomiting)   . Rosacea   . Sleep apnea    cpap  . Systolic murmur     Past Surgical History:  Procedure Laterality Date  . ABDOMINAL HERNIA REPAIR     x2  . ANTERIOR LAT LUMBAR FUSION N/A 01/11/2020   Procedure: Lumbar Two-three Lumbar Three-Four Anterolateral decompression/fusion;  Surgeon: Kristeen Miss, MD;  Location: [place];  Service: Neurosurgery;  Laterality: N/A;  anterolateral  . APPLICATION OF ROBOTIC ASSISTANCE FOR SPINAL PROCEDURE N/A 01/11/2020   Procedure: APPLICATION OF ROBOTIC ASSISTANCE FOR SPINAL PROCEDURE;  Surgeon: Kristeen Miss, MD;  Location: [place];  Service: Neurosurgery;  Laterality: N/A;  posterior  . COLONOSCOPY  04-01-2003   tics and hems  . HEMORRHOID SURGERY    . INGUINAL HERNIA REPAIR    . LUMBAR PERCUTANEOUS PEDICLE SCREW 2 LEVEL N/A 01/11/2020   Procedure: Percutaneous pedicle screw fixation from Lumbar Two to Lumbar Four;  Surgeon: Kristeen Miss, MD;  Location: [place];  Service: Neurosurgery;  Laterality: N/A;  posterior  . NOSE SURGERY    . TONSILLECTOMY    . TOTAL HIP ARTHROPLASTY Right  07/07/2020   Procedure: RIGHT TOTAL HIP ARTHROPLASTY ANTERIOR APPROACH;  Surgeon: Mcarthur Rossetti, MD;  [facility];  Service: Orthopedics;  Laterality: Right;  . TOTAL HIP ARTHROPLASTY Left 09/29/2020   Procedure: LEFT TOTAL HIP ARTHROPLASTY ANTERIOR APPROACH;  Surgeon: Mcarthur Rossetti, MD;  [facility];  Service: Orthopedics;  Laterality: Left;  . uvuloplasty      There were no vitals filed for this visit.   Subjective Assessment - 10/30/20 0938    Subjective Pt arriving to therapy today reporting difficulty walking prolonged pt reports he is trying to walk 2 miles 2-3 times each week.    Pertinent History HTN, Hyperlipidemia, history of skin cancer  subacute of L1 fx. s/p ALIF at L2-3 and L3-4 and PLIF at L2-L4. PMHx significant for skin CA, HTN, and HLD.    Limitations Walking;Standing    Patient Stated Goals Reduce pain, walk 5 miles for walking group, ride bicycle 20 mins    Currently in Pain? No/denies                             [facility] Adult PT Treatment/Exercise - 10/30/20 0001      Exercises   Exercises Knee/Hip      Knee/Hip Exercises: Aerobic  Recumbent Bike L3 x 6 minutes      Knee/Hip Exercises: Machines for Strengthening   Cybex Leg Press 75# bilateral LE's 3x15, L LE only: 36# 3x10      Knee/Hip Exercises: Standing   Heel Raises 20 reps    Hip Abduction Stengthening;Left;20 reps    Hip Extension Stengthening;Left;20 reps    Forward Step Up 2 sets;15 reps;Hand Hold: 1;Step Height: 4"    SLS Left LE: 20 seconds x 3 with intermittent UE support standing at treadmill bar      Knee/Hip Exercises: Seated   Sit to Sand 10 reps;without UE support      Knee/Hip Exercises: Supine   Bridges Strengthening;Both;15 reps    Straight Leg Raises Strengthening;Left;15 reps      Knee/Hip Exercises: Sidelying   Hip ABduction Strengthening;Left;2 sets;10 reps    Hip ABduction Limitations pt needed tactile cues to prevent compensation                     PT Short Term Goals - 10/30/20 7616      PT SHORT TERM GOAL #1   Title Patient will demonstrate independent use of home exercise program to maintain progress from in clinic treatments. 3    Status On-going             PT Long Term Goals - 10/27/20 0929      PT LONG TERM GOAL #1   Title Patient will demonstrate/report pain at worst less than or equal to 2/10 to facilitate minimal limitation in daily activity secondary to pain symptoms.    Time 10    Period Weeks    Status New    Target Date 12/22/20      PT LONG TERM GOAL #2   Title Patient will demonstrate independent use of home exercise program to facilitate ability to maintain/progress functional gains from skilled physical therapy services. 8    Time 8    Period Weeks    Status New    Target Date 12/22/20      PT LONG TERM GOAL #3   Title Pt. will demonstrate FOTO outcome > or = 73 to indicated reduced disability.    Time 8    Period Weeks    Status New    Target Date 12/22/20      PT LONG TERM GOAL #4   Title Pt. will demonstrate Lt hip MMT 4+ or greater, knee ext Lt 5/5 , dynamometry with 15% of Rt to facilitate stabilty in ambulation.    Time 8    Period Weeks    Status New    Target Date 12/22/20      PT LONG TERM GOAL #5   Title Pt. will demonstrate bilateral SLS > 15 seconds to facilitate stability in ambulation at PLOF.    Time 8    Period Weeks    Status New    Target Date 12/22/20      Additional Long Term Goals   Additional Long Term Goals Yes      PT LONG TERM GOAL #6   Title Pt. will indicate ability to walk and bike at PLOF s limitation.    Time 8    Period Weeks    Status New    Target Date 12/22/20                 Plan - 10/30/20 0942    Clinical Impression Statement Pt s/p THA on 09/29/2020. Pt tolerating exercises well  today for LE strengthening, stairs navigation, and balance. HEP reviewed with return demonstration. No pain reported today during  session. Continue skilled PT.    Personal Factors and Comorbidities Comorbidity 3+    Comorbidities HTN, Hyperlipidemia, history of skin cancer, history  L1 fx. s/p ALIF at L2-3 and L3-4 and PLIF at L2-L4    Examination-Activity Limitations Squat;Sit;Stairs;Stand;Locomotion Level;Transfers    Examination-Participation Restrictions Community Activity;Shop;Yard Work;Other    Stability/Clinical Decision Making Stable/Uncomplicated    Rehab Potential Good    PT Duration 8 weeks    PT Treatment/Interventions ADLs/Self Care Home Management;Cryotherapy;Electrical Stimulation;Iontophoresis 4mg /ml Dexamethasone;Moist Heat;Balance training;Therapeutic exercise;Therapeutic activities;Functional mobility training;Stair training;Gait training;DME Instruction;Ultrasound;Neuromuscular re-education;Patient/family education;Dry needling;Joint Manipulations;Passive range of motion;Taping;Manual techniques    PT Next Visit Plan Review HEP, quad strength in SLR, leg press, stair control.  Static balance intervention.    PT Home Exercise Plan [id number]    Consulted and Agree with Plan of Care Patient           Patient will benefit from skilled therapeutic intervention in order to improve the following deficits and impairments:  Abnormal gait,Decreased endurance,Hypomobility,Pain,Decreased strength,Decreased activity tolerance,Decreased balance,Decreased mobility,Difficulty walking,Improper body mechanics,Impaired perceived functional ability,Impaired flexibility,Decreased coordination,Decreased range of motion  Visit Diagnosis: Pain in left hip  Muscle weakness (generalized)  Difficulty in walking, not elsewhere classified     Problem List Patient Active Problem List   Diagnosis Date Noted  . AMS (altered mental status) 10/03/2020  . Status post total replacement of right hip 07/08/2020  . Status post total replacement of left hip 07/07/2020  . Unilateral primary osteoarthritis, left hip 05/15/2020  .  Unilateral primary osteoarthritis, right hip 05/15/2020  . Lumbar stenosis with neurogenic claudication 01/11/2020  . Subacute bronchitis 02/23/2019  . Left lumbar radiculopathy 04/08/2017  . Heart murmur, systolic 35/45/6256  . Hypersomnia 10/12/2015  . Obstructive sleep apnea 02/15/2011  . Foot pain 01/16/2011  . Metatarsalgia of right foot 11/15/2010  . Loss of transverse plantar arch 11/15/2010    Oretha Caprice, PT, MPT 10/30/2020, 10:08 AM  [facility] Physical Therapy [address] [place], [place], [address] Phone: [phone]   Fax:  [phone]  Name: Juan Hudson MRN: [id number] Date of Birth: 01-07-1946

## 2020-11-02 ENCOUNTER — Other Ambulatory Visit: Payer: Self-pay

## 2020-11-02 ENCOUNTER — Encounter: Payer: Self-pay | Admitting: Rehabilitative and Restorative Service Providers"

## 2020-11-02 ENCOUNTER — Ambulatory Visit: Payer: PPO | Admitting: Rehabilitative and Restorative Service Providers"

## 2020-11-02 DIAGNOSIS — R262 Difficulty in walking, not elsewhere classified: Secondary | ICD-10-CM

## 2020-11-02 DIAGNOSIS — M6281 Muscle weakness (generalized): Secondary | ICD-10-CM | POA: Diagnosis not present

## 2020-11-02 DIAGNOSIS — M25552 Pain in left hip: Secondary | ICD-10-CM

## 2020-11-02 NOTE — Therapy (Signed)
[facility] [address] [place], [place], [address] Phone: [phone]   Fax:  [phone]  Physical Therapy Treatment  Patient Details  Name: Juan Hudson MRN: [id number] Date of Birth: 1946-05-10 Referring Provider (PT): Lt THA Anterior   Encounter Date: 11/02/2020   PT End of Session - 11/02/20 1437    Visit Number 3    Number of Visits 12    Date for PT Re-Evaluation 12/22/20    Progress Note Due on Visit 10    PT Start Time 1429    PT Stop Time 1509    PT Time Calculation (min) 40 min    Activity Tolerance Patient tolerated treatment well    Behavior During Therapy [facility] for tasks assessed/performed           Past Medical History:  Diagnosis Date  . Allergy   . Arthritis   . Cancer ([place])    skin cancer  . Cataract    early  . History of kidney stones   . HLD (hyperlipidemia)   . Hypertension   . PONV (postoperative nausea and vomiting)   . Rosacea   . Sleep apnea    cpap  . Systolic murmur     Past Surgical History:  Procedure Laterality Date  . ABDOMINAL HERNIA REPAIR     x2  . ANTERIOR LAT LUMBAR FUSION N/A 01/11/2020   Procedure: Lumbar Two-three Lumbar Three-Four Anterolateral decompression/fusion;  Surgeon: Kristeen Miss, MD;  Location: [place];  Service: Neurosurgery;  Laterality: N/A;  anterolateral  . APPLICATION OF ROBOTIC ASSISTANCE FOR SPINAL PROCEDURE N/A 01/11/2020   Procedure: APPLICATION OF ROBOTIC ASSISTANCE FOR SPINAL PROCEDURE;  Surgeon: Kristeen Miss, MD;  Location: [place];  Service: Neurosurgery;  Laterality: N/A;  posterior  . COLONOSCOPY  04-01-2003   tics and hems  . HEMORRHOID SURGERY    . INGUINAL HERNIA REPAIR    . LUMBAR PERCUTANEOUS PEDICLE SCREW 2 LEVEL N/A 01/11/2020   Procedure: Percutaneous pedicle screw fixation from Lumbar Two to Lumbar Four;  Surgeon: Kristeen Miss, MD;  Location: [place];  Service: Neurosurgery;  Laterality: N/A;  posterior  . NOSE SURGERY    . TONSILLECTOMY    . TOTAL  HIP ARTHROPLASTY Right 07/07/2020   Procedure: RIGHT TOTAL HIP ARTHROPLASTY ANTERIOR APPROACH;  Surgeon: Mcarthur Rossetti, MD;  Location: [facility];  Service: Orthopedics;  Laterality: Right;  . TOTAL HIP ARTHROPLASTY Left 09/29/2020   Procedure: LEFT TOTAL HIP ARTHROPLASTY ANTERIOR APPROACH;  Surgeon: Mcarthur Rossetti, MD;  Location: [facility];  Service: Orthopedics;  Laterality: Left;  . uvuloplasty      There were no vitals filed for this visit.   Subjective Assessment - 11/02/20 1436    Subjective Pt. indicated no pain today.  Had complaints of soreness c sit and stands, soreness noted in anterior hip region bilateral.    Pertinent History HTN, Hyperlipidemia, history of skin cancer  subacute of L1 fx. s/p ALIF at L2-3 and L3-4 and PLIF at L2-L4. PMHx significant for skin CA, HTN, and HLD.    Limitations Walking;Standing    Patient Stated Goals Reduce pain, walk 5 miles for walking group, ride bicycle 20 mins    Currently in Pain? No/denies                             [facility] Adult PT Treatment/Exercise - 11/02/20 0001      Neuro Re-ed    Neuro Re-ed Details  single leg stance 20 sec x 4 each leg, toe/heel raise rocking 20x each way      Knee/Hip Exercises: Aerobic   Recumbent Bike Lvl 3 10 mins      Knee/Hip Exercises: Machines for Strengthening   Cybex Knee Extension eccentric Lt leg lowering 3 x 10 15 lbs    Cybex Leg Press held due to soreness complaints, resume on future visit      Knee/Hip Exercises: Standing   Hip Abduction Right;Left;20 reps    Hip Extension Left;Right;20 reps    Lateral Step Up Step Height: 4";2 sets;10 reps;Both    Forward Step Up Step Height: 4";15 reps;Both      Knee/Hip Exercises: Seated   Sit to Sand --   held due to soreness, resume in HEP                 PT Education - 11/02/20 1451    Education Details Delayed onset muscle soreness and pain monitoring scale for activity    Person(s) Educated Patient     Methods Explanation    Comprehension Verbalized understanding            PT Short Term Goals - 10/30/20 2025      PT SHORT TERM GOAL #1   Title Patient will demonstrate independent use of home exercise program to maintain progress from in clinic treatments. 3    Status On-going             PT Long Term Goals - 10/27/20 0929      PT LONG TERM GOAL #1   Title Patient will demonstrate/report pain at worst less than or equal to 2/10 to facilitate minimal limitation in daily activity secondary to pain symptoms.    Time 10    Period Weeks    Status New    Target Date 12/22/20      PT LONG TERM GOAL #2   Title Patient will demonstrate independent use of home exercise program to facilitate ability to maintain/progress functional gains from skilled physical therapy services. 8    Time 8    Period Weeks    Status New    Target Date 12/22/20      PT LONG TERM GOAL #3   Title Pt. will demonstrate FOTO outcome > or = 73 to indicated reduced disability.    Time 8    Period Weeks    Status New    Target Date 12/22/20      PT LONG TERM GOAL #4   Title Pt. will demonstrate Lt hip MMT 4+ or greater, knee ext Lt 5/5 , dynamometry with 15% of Rt to facilitate stabilty in ambulation.    Time 8    Period Weeks    Status New    Target Date 12/22/20      PT LONG TERM GOAL #5   Title Pt. will demonstrate bilateral SLS > 15 seconds to facilitate stability in ambulation at PLOF.    Time 8    Period Weeks    Status New    Target Date 12/22/20      Additional Long Term Goals   Additional Long Term Goals Yes      PT LONG TERM GOAL #6   Title Pt. will indicate ability to walk and bike at PLOF s limitation.    Time 8    Period Weeks    Status New    Target Date 12/22/20  Plan - 11/02/20 1444    Clinical Impression Statement Reports and evidence of increased quad/hip flexor fatigue noted in strengthening intervention.  Discussion had about continued  performance of strengthening, delayed onset muscle soreness and informing clinician about any worsening over course of HEP.    Personal Factors and Comorbidities Comorbidity 3+    Comorbidities HTN, Hyperlipidemia, history of skin cancer, history  L1 fx. s/p ALIF at L2-3 and L3-4 and PLIF at L2-L4    Examination-Activity Limitations Squat;Sit;Stairs;Stand;Locomotion Level;Transfers    Examination-Participation Restrictions Community Activity;Shop;Yard Work;Other    Stability/Clinical Decision Making Stable/Uncomplicated    Rehab Potential Good    PT Duration 8 weeks    PT Treatment/Interventions ADLs/Self Care Home Management;Cryotherapy;Electrical Stimulation;Iontophoresis 4mg /ml Dexamethasone;Moist Heat;Balance training;Therapeutic exercise;Therapeutic activities;Functional mobility training;Stair training;Gait training;DME Instruction;Ultrasound;Neuromuscular re-education;Patient/family education;Dry needling;Joint Manipulations;Passive range of motion;Taping;Manual techniques    PT Next Visit Plan Continue progressive quad, Lt leg strengthening in machine weights and WB stair/transfers as appropriate.  Transitioning to compliant surface balance.    PT Home Exercise Plan [email or web address]    Consulted and Agree with Plan of Care Patient           Patient will benefit from skilled therapeutic intervention in order to improve the following deficits and impairments:  Abnormal gait,Decreased endurance,Hypomobility,Pain,Decreased strength,Decreased activity tolerance,Decreased balance,Decreased mobility,Difficulty walking,Improper body mechanics,Impaired perceived functional ability,Impaired flexibility,Decreased coordination,Decreased range of motion  Visit Diagnosis: Pain in left hip  Muscle weakness (generalized)  Difficulty in walking, not elsewhere classified     Problem List Patient Active Problem List   Diagnosis Date Noted  . AMS (altered mental status) 10/03/2020  . Status post total  replacement of right hip 07/08/2020  . Status post total replacement of left hip 07/07/2020  . Unilateral primary osteoarthritis, left hip 05/15/2020  . Unilateral primary osteoarthritis, right hip 05/15/2020  . Lumbar stenosis with neurogenic claudication 01/11/2020  . Subacute bronchitis 02/23/2019  . Left lumbar radiculopathy 04/08/2017  . Heart murmur, systolic 81/85/9093  . Hypersomnia 10/12/2015  . Obstructive sleep apnea 02/15/2011  . Foot pain 01/16/2011  . Metatarsalgia of right foot 11/15/2010  . Loss of transverse plantar arch 11/15/2010    Scot Jun, PT, DPT, OCS, ATC 11/02/20  3:02 PM    [facility] [address] [place], [place], [address] Phone: [phone]   Fax:  [phone]  Name: Juan Hudson MRN: [id number] Date of Birth: August 29, 1945

## 2020-11-07 ENCOUNTER — Other Ambulatory Visit: Payer: Self-pay

## 2020-11-07 ENCOUNTER — Ambulatory Visit: Payer: PPO | Admitting: Rehabilitative and Restorative Service Providers"

## 2020-11-07 ENCOUNTER — Encounter: Payer: Self-pay | Admitting: Rehabilitative and Restorative Service Providers"

## 2020-11-07 DIAGNOSIS — R262 Difficulty in walking, not elsewhere classified: Secondary | ICD-10-CM | POA: Diagnosis not present

## 2020-11-07 DIAGNOSIS — M6281 Muscle weakness (generalized): Secondary | ICD-10-CM | POA: Diagnosis not present

## 2020-11-07 DIAGNOSIS — M25652 Stiffness of left hip, not elsewhere classified: Secondary | ICD-10-CM

## 2020-11-07 NOTE — Therapy (Signed)
[facility] [address] [place], [place], [address] Phone: [phone]   Fax:  [phone]  Physical Therapy Treatment  Patient Details  Name: Juan Hudson MRN: [id number] Date of Birth: 08-13-1945 Referring Provider (PT): Lt THA Anterior   Encounter Date: 11/07/2020   PT End of Session - 11/07/20 1626    Visit Number 4    Number of Visits 12    Date for PT Re-Evaluation 12/22/20    Progress Note Due on Visit 10    PT Start Time 1346    PT Stop Time 1430    PT Time Calculation (min) 44 min    Activity Tolerance Patient tolerated treatment well;No increased pain    Behavior During Therapy WFL for tasks assessed/performed           Past Medical History:  Diagnosis Date  . Allergy   . Arthritis   . Cancer ([place])    skin cancer  . Cataract    early  . History of kidney stones   . HLD (hyperlipidemia)   . Hypertension   . PONV (postoperative nausea and vomiting)   . Rosacea   . Sleep apnea    cpap  . Systolic murmur     Past Surgical History:  Procedure Laterality Date  . ABDOMINAL HERNIA REPAIR     x2  . ANTERIOR LAT LUMBAR FUSION N/A 01/11/2020   Procedure: Lumbar Two-three Lumbar Three-Four Anterolateral decompression/fusion;  Surgeon: Kristeen Miss, MD;  Location: [place];  Service: Neurosurgery;  Laterality: N/A;  anterolateral  . APPLICATION OF ROBOTIC ASSISTANCE FOR SPINAL PROCEDURE N/A 01/11/2020   Procedure: APPLICATION OF ROBOTIC ASSISTANCE FOR SPINAL PROCEDURE;  Surgeon: Kristeen Miss, MD;  Location: [place];  Service: Neurosurgery;  Laterality: N/A;  posterior  . COLONOSCOPY  04-01-2003   tics and hems  . HEMORRHOID SURGERY    . INGUINAL HERNIA REPAIR    . LUMBAR PERCUTANEOUS PEDICLE SCREW 2 LEVEL N/A 01/11/2020   Procedure: Percutaneous pedicle screw fixation from Lumbar Two to Lumbar Four;  Surgeon: Kristeen Miss, MD;  Location: [place];  Service: Neurosurgery;  Laterality: N/A;  posterior  . NOSE SURGERY    .  TONSILLECTOMY    . TOTAL HIP ARTHROPLASTY Right 07/07/2020   Procedure: RIGHT TOTAL HIP ARTHROPLASTY ANTERIOR APPROACH;  Surgeon: Mcarthur Rossetti, MD;  [facility];  Service: Orthopedics;  Laterality: Right;  . TOTAL HIP ARTHROPLASTY Left 09/29/2020   Procedure: LEFT TOTAL HIP ARTHROPLASTY ANTERIOR APPROACH;  Surgeon: Mcarthur Rossetti, MD;  [facility];  Service: Orthopedics;  Laterality: Left;  . uvuloplasty      There were no vitals filed for this visit.   Subjective Assessment - 11/07/20 1622    Subjective Juan Hudson reports good HEP compliance.  Although happy with his progress he notes he doesn't "walk right."    Pertinent History HTN, Hyperlipidemia, history of skin cancer  subacute of L1 fx. s/p ALIF at L2-3 and L3-4 and PLIF at L2-L4. PMHx significant for skin CA, HTN, and HLD.    Limitations Walking;Standing    Patient Stated Goals Reduce pain, walk 5 miles for walking group, ride bicycle 20 mins    Currently in Pain? No/denies    Pain Onset More than a month ago    Pain Frequency Occasional    Aggravating Factors  Start-up stiffness in the morning and with prolonged postures    Pain Relieving Factors Change of position    Effect of Pain on Daily Activities Walking  feels "off"    Multiple Pain Sites No                             [facility] Adult PT Treatment/Exercise - 11/07/20 0001      Neuro Re-ed    Neuro Re-ed Details  Single leg stance 5X 20 seconds and heel toe raises no hands 20X (in doorway, encourage step strategy)      Exercises   Exercises Knee/Hip      Knee/Hip Exercises: Stretches   Active Hamstring Stretch Both;2 reps;20 seconds    Hip Flexor Stretch Both;2 reps;20 seconds    Piriformis Stretch Both;2 reps;20 seconds   Figure 4   Other Knee/Hip Stretches Gluteal stretch 2X 20 seconds      Knee/Hip Exercises: Standing   Heel Raises 20 reps    Other Standing Knee Exercises Trunk extension AROM 10X 3 seconds       Knee/Hip Exercises: Seated   Long Arc Quad Strengthening;Both;2 sets;10 reps   See HEP   Sit to Sand 10 reps;without UE support;Other (comment)   slow eccentrics     Knee/Hip Exercises: Supine   Bridges Strengthening;Both;10 reps;Limitations    Bridges Limitations 5 seconds slow eccentrics      Knee/Hip Exercises: Sidelying   Clams 10X 3 seconds each side slow eccentrics                  PT Education - 11/07/20 1624    Education Details Reviewed HEP with trunk extension addition for anterior hip and spine stretching.  Introducted (optional) stretches for his hips to help with the stiff feeling with gait.    Person(s) Educated Patient    Methods Explanation;Demonstration;Verbal cues;Handout    Comprehension Verbalized understanding;Need further instruction;Returned demonstration;Verbal cues required            PT Short Term Goals - 11/07/20 1625      PT SHORT TERM GOAL #1   Title Patient will demonstrate independent use of home exercise program to maintain progress from in clinic treatments. 3    Status Achieved             PT Long Term Goals - 11/07/20 1625      PT LONG TERM GOAL #1   Title Patient will demonstrate/report pain at worst less than or equal to 2/10 to facilitate minimal limitation in daily activity secondary to pain symptoms.    Time 10    Period Weeks    Status Achieved      PT LONG TERM GOAL #2   Title Patient will demonstrate independent use of home exercise program to facilitate ability to maintain/progress functional gains from skilled physical therapy services. 8    Time 8    Period Weeks    Status On-going      PT LONG TERM GOAL #3   Title Pt. will demonstrate FOTO outcome > or = 73 to indicated reduced disability.    Time 8    Period Weeks    Status On-going      PT LONG TERM GOAL #4   Title Pt. will demonstrate Lt hip MMT 4+ or greater, knee ext Lt 5/5 , dynamometry with 15% of Rt to facilitate stabilty in ambulation.    Time 8     Period Weeks    Status On-going      PT LONG TERM GOAL #5   Title Pt. will demonstrate bilateral SLS > 15 seconds to  facilitate stability in ambulation at PLOF.    Time 8    Period Weeks    Status On-going      PT LONG TERM GOAL #6   Title Pt. will indicate ability to walk and bike at PLOF s limitation.    Time 8    Period Weeks    Status On-going                 Plan - 11/07/20 1626    Clinical Impression Statement Juan Hudson is happy with his early PT progress.  He feels and looks stiff with gait although no lateral lean is noted.  He was given a hip/trunk extension extercise to address some hip and back stiffness in the morning and a trial of hip stretches to help with subjective stiffness.  Continue current strength and proprioception work to meet all LTGs.    Personal Factors and Comorbidities Comorbidity 3+    Comorbidities HTN, Hyperlipidemia, history of skin cancer, history  L1 fx. s/p ALIF at L2-3 and L3-4 and PLIF at L2-L4    Examination-Activity Limitations Squat;Sit;Stairs;Stand;Locomotion Level;Transfers    Examination-Participation Restrictions Community Activity;Shop;Yard Work;Other    Stability/Clinical Decision Making Stable/Uncomplicated    Rehab Potential Good    PT Duration 8 weeks    PT Treatment/Interventions ADLs/Self Care Home Management;Cryotherapy;Electrical Stimulation;Iontophoresis 4mg /ml Dexamethasone;Moist Heat;Balance training;Therapeutic exercise;Therapeutic activities;Functional mobility training;Stair training;Gait training;DME Instruction;Ultrasound;Neuromuscular re-education;Patient/family education;Dry needling;Joint Manipulations;Passive range of motion;Taping;Manual techniques    PT Next Visit Plan Continue progressive quad, Lt leg strengthening in machine weights and WB stair/transfers as appropriate.  Transitioning to compliant surface balance.    PT Home Exercise Plan [email or web address]    Consulted and Agree with Plan of Care Patient            Patient will benefit from skilled therapeutic intervention in order to improve the following deficits and impairments:  Abnormal gait,Decreased endurance,Hypomobility,Pain,Decreased strength,Decreased activity tolerance,Decreased balance,Decreased mobility,Difficulty walking,Improper body mechanics,Impaired perceived functional ability,Impaired flexibility,Decreased coordination,Decreased range of motion  Visit Diagnosis: Difficulty walking  Muscle weakness (generalized)  Stiffness of left hip, not elsewhere classified     Problem List Patient Active Problem List   Diagnosis Date Noted  . AMS (altered mental status) 10/03/2020  . Status post total replacement of right hip 07/08/2020  . Status post total replacement of left hip 07/07/2020  . Unilateral primary osteoarthritis, left hip 05/15/2020  . Unilateral primary osteoarthritis, right hip 05/15/2020  . Lumbar stenosis with neurogenic claudication 01/11/2020  . Subacute bronchitis 02/23/2019  . Left lumbar radiculopathy 04/08/2017  . Heart murmur, systolic 63/14/9702  . Hypersomnia 10/12/2015  . Obstructive sleep apnea 02/15/2011  . Foot pain 01/16/2011  . Metatarsalgia of right foot 11/15/2010  . Loss of transverse plantar arch 11/15/2010    Farley Ly PT, MPT 11/07/2020, 4:29 PM  [facility] [address] [place], [place], [address] Phone: [phone]   Fax:  [phone]  Name: Juan Hudson MRN: [id number] Date of Birth: 26-Aug-1945

## 2020-11-07 NOTE — Patient Instructions (Signed)
Access Code: [email or web address] URL: [email or web address] Date: 11/07/2020 Prepared by: Vista Mink  Exercises Clamshell - 2 x daily - 7 x weekly - 3 sets - 10 reps Supine Bridge - 2 x daily - 7 x weekly - 3 sets - 10 reps - 2 hold Seated Straight Leg Heel Taps - 1-2 x daily - 7 x weekly - 3 sets - 10 reps Standing Single Leg Stance with Counter Support - 1 x daily - 7 x weekly - 1 sets - 5 reps - 20-30 hold Sit to Stand - 1 x daily - 7 x weekly - 3 sets - 10 reps Standing Lumbar Extension at [place] - 5 x daily - 7 x weekly - 1 sets - 5 reps - 3 seconds hold

## 2020-11-09 ENCOUNTER — Encounter: Payer: Self-pay | Admitting: Orthopaedic Surgery

## 2020-11-09 ENCOUNTER — Ambulatory Visit (INDEPENDENT_AMBULATORY_CARE_PROVIDER_SITE_OTHER): Payer: PPO | Admitting: Orthopaedic Surgery

## 2020-11-09 DIAGNOSIS — Z96641 Presence of right artificial hip joint: Secondary | ICD-10-CM

## 2020-11-09 DIAGNOSIS — Z96642 Presence of left artificial hip joint: Secondary | ICD-10-CM

## 2020-11-09 NOTE — Progress Notes (Signed)
The patient is now 6 weeks status post a left total hip arthroplasty and over 3 and half months out from his right hip being replaced.  He is 75 years old.  He seems to be doing well overall.  He is walking without assistive device and has been in outpatient physical therapy.  I feel that he can likely be released from outpatient physical therapy at this standpoint.  He is walking without any significant limp.  He gets up on the exam table easily.  Both hips move smoothly and fluidly and his leg lengths are equal.  At this point we will see him back for 1 more visit in 4 weeks.  If he looks good then we will need to see him back for 6 months.  All questions and concerns were answered and addressed.

## 2020-11-13 ENCOUNTER — Ambulatory Visit: Payer: PPO | Admitting: Physical Therapy

## 2020-11-13 ENCOUNTER — Encounter: Payer: Self-pay | Admitting: Physical Therapy

## 2020-11-13 ENCOUNTER — Other Ambulatory Visit: Payer: Self-pay

## 2020-11-13 DIAGNOSIS — M25552 Pain in left hip: Secondary | ICD-10-CM | POA: Diagnosis not present

## 2020-11-13 DIAGNOSIS — R262 Difficulty in walking, not elsewhere classified: Secondary | ICD-10-CM | POA: Diagnosis not present

## 2020-11-13 DIAGNOSIS — M6281 Muscle weakness (generalized): Secondary | ICD-10-CM | POA: Diagnosis not present

## 2020-11-13 DIAGNOSIS — M25652 Stiffness of left hip, not elsewhere classified: Secondary | ICD-10-CM

## 2020-11-13 NOTE — Therapy (Addendum)
[facility] Physical Therapy [address] [place], [place], [address] Phone: [phone]   Fax:  [phone]  Physical Therapy Treatment  Patient Details  Name: Juan Hudson MRN: [id number] Date of Birth: Jan 07, 1946 Referring Provider (PT): Lt THA Anterior   Encounter Date: 11/13/2020   PT End of Session - 11/13/20 1334    Visit Number 5    Number of Visits 12    Date for PT Re-Evaluation 12/22/20    Progress Note Due on Visit 10    PT Start Time 2355    PT Stop Time 1343    PT Time Calculation (min) 38 min    Activity Tolerance Patient tolerated treatment well;No increased pain    Behavior During Therapy WFL for tasks assessed/performed           Past Medical History:  Diagnosis Date  . Allergy   . Arthritis   . Cancer ([place])    skin cancer  . Cataract    early  . History of kidney stones   . HLD (hyperlipidemia)   . Hypertension   . PONV (postoperative nausea and vomiting)   . Rosacea   . Sleep apnea    cpap  . Systolic murmur     Past Surgical History:  Procedure Laterality Date  . ABDOMINAL HERNIA REPAIR     x2  . ANTERIOR LAT LUMBAR FUSION N/A 01/11/2020   Procedure: Lumbar Two-three Lumbar Three-Four Anterolateral decompression/fusion;  Surgeon: Kristeen Miss, MD;  Location: [place];  Service: Neurosurgery;  Laterality: N/A;  anterolateral  . APPLICATION OF ROBOTIC ASSISTANCE FOR SPINAL PROCEDURE N/A 01/11/2020   Procedure: APPLICATION OF ROBOTIC ASSISTANCE FOR SPINAL PROCEDURE;  Surgeon: Kristeen Miss, MD;  Location: [place];  Service: Neurosurgery;  Laterality: N/A;  posterior  . COLONOSCOPY  04-01-2003   tics and hems  . HEMORRHOID SURGERY    . INGUINAL HERNIA REPAIR    . LUMBAR PERCUTANEOUS PEDICLE SCREW 2 LEVEL N/A 01/11/2020   Procedure: Percutaneous pedicle screw fixation from Lumbar Two to Lumbar Four;  Surgeon: Kristeen Miss, MD;  Location: [place];  Service: Neurosurgery;  Laterality: N/A;  posterior  . NOSE SURGERY    .  TONSILLECTOMY    . TOTAL HIP ARTHROPLASTY Right 07/07/2020   Procedure: RIGHT TOTAL HIP ARTHROPLASTY ANTERIOR APPROACH;  Surgeon: Mcarthur Rossetti, MD;  Location[facility];  Service: Orthopedics;  Laterality: Right;  . TOTAL HIP ARTHROPLASTY Left 09/29/2020   Procedure: LEFT TOTAL HIP ARTHROPLASTY ANTERIOR APPROACH;  Surgeon: Mcarthur Rossetti, MD;  Location: [facility];  Service: Orthopedics;  Laterality: Left;  . uvuloplasty      There were no vitals filed for this visit.   Subjective Assessment - 11/13/20 1331    Subjective Pt arriving today reporting good report from Dr. Ninfa Linden and needing to follow up again in 4 weeks. Pt reporting no pain upon arrival today.    Pertinent History HTN, Hyperlipidemia, history of skin cancer  subacute of L1 fx. s/p ALIF at L2-3 and L3-4 and PLIF at L2-L4. PMHx significant for skin CA, HTN, and HLD.    Limitations Walking;Standing    Patient Stated Goals Reduce pain, walk 5 miles for walking group, ride bicycle 20 mins    Currently in Pain? No/denies                             [facility] Adult PT Treatment/Exercise - 11/13/20 0001      Neuro Re-ed  Neuro Re-ed Details  tandum stance, single leg stance x 5 with intermittent UE support, standing on Airex with feet together, feet apart, eyes open, eyes closed each x 30 seconds with intermittent CGA      Exercises   Exercises Knee/Hip      Knee/Hip Exercises: Stretches   Active Hamstring Stretch Both;2 reps;20 seconds    Hip Flexor Stretch Both;2 reps;20 seconds    Hip Flexor Stretch Limitations standing    Piriformis Stretch 2 reps;30 seconds    Piriformis Stretch Limitations both pulling knee toward opposite shoulder and pushing knee away      Knee/Hip Exercises: Machines for Strengthening   Cybex Leg Press 75# bilateral LE's, 3x10, L LE only 36# 2 x 10      Knee/Hip Exercises: Standing   Hip Abduction Stengthening;Left;20 reps;Knee straight;Right    Hip Extension  Stengthening;Right;Left;20 reps    Lateral Step Up Step Height: 4";2 sets;10 reps;Hand Hold: 1    Forward Step Up 10 reps;2 sets;Hand Hold: 0;Step Height: 4"                    PT Short Term Goals - 11/13/20 1339      PT SHORT TERM GOAL #1   Title Patient will demonstrate independent use of home exercise program to maintain progress from in clinic treatments. 3    Status On-going             PT Long Term Goals - 11/13/20 1339      PT LONG TERM GOAL #1   Title Patient will demonstrate/report pain at worst less than or equal to 2/10 to facilitate minimal limitation in daily activity secondary to pain symptoms.    Status On-going      PT LONG TERM GOAL #2   Title Patient will demonstrate independent use of home exercise program to facilitate ability to maintain/progress functional gains from skilled physical therapy services. 8    Status On-going      PT LONG TERM GOAL #3   Title Pt. will demonstrate FOTO outcome > or = 73 to indicated reduced disability.    Status On-going      PT LONG TERM GOAL #4   Title Pt. will demonstrate Lt hip MMT 4+ or greater, knee ext Lt 5/5 , dynamometry with 15% of Rt to facilitate stabilty in ambulation.    Status On-going      PT LONG TERM GOAL #5   Title Pt. will demonstrate bilateral SLS > 15 seconds to facilitate stability in ambulation at PLOF.    Status On-going      PT LONG TERM GOAL #6   Title Pt. will indicate ability to walk and bike at PLOF s limitation.                 Plan - 11/13/20 1335    Clinical Impression Statement Pt arriving today with no pain reported at rest. Pt still reporting stiffness in his low back and pain with transitions. Pt with weakness noted in left LE compared to right. Pt was issued printed handout of his stretches. Today pt was able to tolerate balance and strengthning exercises without difficulty. Cotninued skilled PT.    Personal Factors and Comorbidities Comorbidity 3+    Comorbidities  HTN, Hyperlipidemia, history of skin cancer, history  L1 fx. s/p ALIF at L2-3 and L3-4 and PLIF at L2-L4    Examination-Activity Limitations Squat;Sit;Stairs;Stand;Locomotion Level;Transfers    Examination-Participation Restrictions Community Activity;Shop;Yard Work;Other  Stability/Clinical Decision Making Stable/Uncomplicated    Rehab Potential Good    PT Duration 8 weeks    PT Treatment/Interventions ADLs/Self Care Home Management;Cryotherapy;Electrical Stimulation;Iontophoresis 4mg /ml Dexamethasone;Moist Heat;Balance training;Therapeutic exercise;Therapeutic activities;Functional mobility training;Stair training;Gait training;DME Instruction;Ultrasound;Neuromuscular re-education;Patient/family education;Dry needling;Joint Manipulations;Passive range of motion;Taping;Manual techniques    PT Next Visit Plan Continue progressive quad, Lt leg strengthening in machine weights and WB stair/transfers as appropriate.  Transitioning to compliant surface balance.    PT Home Exercise Plan [email or web address]    Consulted and Agree with Plan of Care Patient           Patient will benefit from skilled therapeutic intervention in order to improve the following deficits and impairments:  Abnormal gait,Decreased endurance,Hypomobility,Pain,Decreased strength,Decreased activity tolerance,Decreased balance,Decreased mobility,Difficulty walking,Improper body mechanics,Impaired perceived functional ability,Impaired flexibility,Decreased coordination,Decreased range of motion  Visit Diagnosis: Difficulty walking  Muscle weakness (generalized)  Stiffness of left hip, not elsewhere classified  Pain in left hip  Difficulty in walking, not elsewhere classified     Problem List Patient Active Problem List   Diagnosis Date Noted  . AMS (altered mental status) 10/03/2020  . Status post total replacement of right hip 07/08/2020  . Status post total replacement of left hip 07/07/2020  . Unilateral primary  osteoarthritis, left hip 05/15/2020  . Unilateral primary osteoarthritis, right hip 05/15/2020  . Lumbar stenosis with neurogenic claudication 01/11/2020  . Subacute bronchitis 02/23/2019  . Left lumbar radiculopathy 04/08/2017  . Heart murmur, systolic 78/46/9629  . Hypersomnia 10/12/2015  . Obstructive sleep apnea 02/15/2011  . Foot pain 01/16/2011  . Metatarsalgia of right foot 11/15/2010  . Loss of transverse plantar arch 11/15/2010    Oretha Caprice, PT, MPT 11/13/2020, 1:49 PM  [facility] Physical Therapy [address] [place], [place], [address] Phone: [phone]   Fax:  [phone]  Name: FILIBERTO WAMBLE MRN: [id number] Date of Birth: 07/29/1945

## 2020-11-13 NOTE — Patient Instructions (Signed)
Access Code: [email or web address] URL: [email or web address] Date: 11/13/2020 Prepared by: Kearney Hard  Exercises Clamshell - 2 x daily - 7 x weekly - 3 sets - 10 reps Supine Bridge - 2 x daily - 7 x weekly - 3 sets - 10 reps - 2 hold Seated Straight Leg Heel Taps - 1-2 x daily - 7 x weekly - 3 sets - 10 reps Standing Single Leg Stance with Counter Support - 1 x daily - 7 x weekly - 1 sets - 5 reps - 20-30 hold Sit to Stand - 1 x daily - 7 x weekly - 3 sets - 10 reps Standing Lumbar Extension at Wall - Forearms - 5 x daily - 7 x weekly - 1 sets - 5 reps - 3 seconds hold Supine Figure 4 Piriformis Stretch - 1 x daily - 7 x weekly - 3-5 reps - 30 seconds hold Supine Piriformis Stretch with Foot on Ground - 1 x daily - 7 x weekly - 3-5 reps - 30 seconds hold Hip Flexor Stretch on Step - 2 x daily - 7 x weekly - 3-5 reps - 30 seconds hold

## 2020-11-14 ENCOUNTER — Encounter: Payer: PPO | Admitting: Physical Therapy

## 2020-11-17 ENCOUNTER — Encounter: Payer: PPO | Admitting: Rehabilitative and Restorative Service Providers"

## 2020-11-24 ENCOUNTER — Ambulatory Visit: Payer: PPO | Admitting: Rehabilitative and Restorative Service Providers"

## 2020-11-24 ENCOUNTER — Other Ambulatory Visit: Payer: Self-pay

## 2020-11-24 ENCOUNTER — Encounter: Payer: PPO | Admitting: Rehabilitative and Restorative Service Providers"

## 2020-11-24 ENCOUNTER — Encounter: Payer: Self-pay | Admitting: Rehabilitative and Restorative Service Providers"

## 2020-11-24 DIAGNOSIS — R262 Difficulty in walking, not elsewhere classified: Secondary | ICD-10-CM | POA: Diagnosis not present

## 2020-11-24 DIAGNOSIS — M6281 Muscle weakness (generalized): Secondary | ICD-10-CM

## 2020-11-24 DIAGNOSIS — M25552 Pain in left hip: Secondary | ICD-10-CM

## 2020-11-24 NOTE — Therapy (Signed)
[facility] Physical Therapy [address] [place], [place], [address] Phone: [phone]   Fax:  [phone]  Physical Therapy Treatment  Patient Details  Name: Juan Hudson MRN: [id number] Date of Birth: 01/06/1946 Referring Provider (PT): Lt THA Anterior   Encounter Date: 11/24/2020   PT End of Session - 11/24/20 0919    Visit Number 6    Number of Visits 12    Date for PT Re-Evaluation 12/22/20    Progress Note Due on Visit 10    PT Start Time 0919    PT Stop Time 0959    PT Time Calculation (min) 40 min    Activity Tolerance Patient tolerated treatment well;No increased pain    Behavior During Therapy WFL for tasks assessed/performed           Past Medical History:  Diagnosis Date  . Allergy   . Arthritis   . Cancer ([place])    skin cancer  . Cataract    early  . History of kidney stones   . HLD (hyperlipidemia)   . Hypertension   . PONV (postoperative nausea and vomiting)   . Rosacea   . Sleep apnea    cpap  . Systolic murmur     Past Surgical History:  Procedure Laterality Date  . ABDOMINAL HERNIA REPAIR     x2  . ANTERIOR LAT LUMBAR FUSION N/A 01/11/2020   Procedure: Lumbar Two-three Lumbar Three-Four Anterolateral decompression/fusion;  Surgeon: Kristeen Miss, MD;  Location: [place];  Service: Neurosurgery;  Laterality: N/A;  anterolateral  . APPLICATION OF ROBOTIC ASSISTANCE FOR SPINAL PROCEDURE N/A 01/11/2020   Procedure: APPLICATION OF ROBOTIC ASSISTANCE FOR SPINAL PROCEDURE;  Surgeon: Kristeen Miss, MD;  Location: [place];  Service: Neurosurgery;  Laterality: N/A;  posterior  . COLONOSCOPY  04-01-2003   tics and hems  . HEMORRHOID SURGERY    . INGUINAL HERNIA REPAIR    . LUMBAR PERCUTANEOUS PEDICLE SCREW 2 LEVEL N/A 01/11/2020   Procedure: Percutaneous pedicle screw fixation from Lumbar Two to Lumbar Four;  Surgeon: Kristeen Miss, MD;  Location: [place];  Service: Neurosurgery;  Laterality: N/A;  posterior  . NOSE SURGERY    .  TONSILLECTOMY    . TOTAL HIP ARTHROPLASTY Right 07/07/2020   Procedure: RIGHT TOTAL HIP ARTHROPLASTY ANTERIOR APPROACH;  Surgeon: Mcarthur Rossetti, MD;  Location: [facility];  Service: Orthopedics;  Laterality: Right;  . TOTAL HIP ARTHROPLASTY Left 09/29/2020   Procedure: LEFT TOTAL HIP ARTHROPLASTY ANTERIOR APPROACH;  Surgeon: Mcarthur Rossetti, MD;  Location: [facility];  Service: Orthopedics;  Laterality: Left;  . uvuloplasty      There were no vitals filed for this visit.   Subjective Assessment - 11/24/20 0923    Subjective Pt. stated no pain hip, some stiffness in back in morning but better with movement.  Pt. stated 80% overall improvement at this time.    Pertinent History HTN, Hyperlipidemia, history of skin cancer  subacute of L1 fx. s/p ALIF at L2-3 and L3-4 and PLIF at L2-L4. PMHx significant for skin CA, HTN, and HLD.    Limitations Walking;Standing    Patient Stated Goals Reduce pain, walk 5 miles for walking group, ride bicycle 20 mins    Currently in Pain? No/denies    Pain Score 0-No pain              [facility] PT Assessment - 11/24/20 0001      Assessment   Medical Diagnosis Dr. Ninfa Linden    Referring Provider (  PT) Lt THA Anterior    Onset Date/Surgical Date 09/29/20    Hand Dominance Left      Observation/Other Assessments   Focus on Therapeutic Outcomes (FOTO)  FOTO update 60%      Single Leg Stance   Comments Rt SLS 6 seconds,  Lt SLS 5 seconds      AROM   Overall AROM Comments Lumbar extension 75% WFL s reduced pain after 5 reps compared to starting symptoms      Strength   Right Hip Flexion 5/5   41, 43 lbs   Right Hip ABduction 3+/5   19.4 lbs   Left Hip Flexion 5/5   43, 43.6 lbs   Left Hip ABduction 3+/5   18.4 lbs   Right Knee Extension 5/5    Left Knee Extension 5/5   57.5, 65.5 lbs     Transfers   Five time sit to stand comments  11 seconds                         [facility] Adult PT Treatment/Exercise - 11/24/20 0001       Neuro Re-ed    Neuro Re-ed Details  SLS attempts approx. 5 seconds x 3 each, tandem stance c occasional HHA 1 min x 1 bilateral      Exercises   Other Exercises  HEP review c cues, technique review.  Due to clinical testing and HEP, progression of existing exercises particularily in standing /balnace was limited today      Knee/Hip Exercises: Aerobic   Recumbent Bike Lvl 4 10 mins      Knee/Hip Exercises: Standing   Other Standing Knee Exercises lumbar extension 2 x 5 c cues for use at home after prolonged sitting, lying down      Knee/Hip Exercises: Seated   Sit to Sand 5 reps;without UE support   in sit to stand 5x testing     Knee/Hip Exercises: Supine   Bridges 15 reps;Both   5 second hold     Knee/Hip Exercises: Sidelying   Hip ABduction Both;15 reps   c cues for positioning                 PT Education - 11/24/20 0958    Education Details HEP adjustments, cues for transitioning plan for D/C to home in future.    Person(s) Educated Patient    Methods Explanation;Demonstration;Verbal cues;Handout    Comprehension Returned demonstration;Verbalized understanding            PT Short Term Goals - 11/24/20 0955      PT SHORT TERM GOAL #1   Title Patient will demonstrate independent use of home exercise program to maintain progress from in clinic treatments. 3    Status Achieved             PT Long Term Goals - 11/24/20 0957      PT LONG TERM GOAL #1   Title Patient will demonstrate/report pain at worst less than or equal to 2/10 to facilitate minimal limitation in daily activity secondary to pain symptoms.    Status On-going    Target Date 12/22/20      PT LONG TERM GOAL #2   Title Patient will demonstrate independent use of home exercise program to facilitate ability to maintain/progress functional gains from skilled physical therapy services. 8    Status On-going    Target Date 12/22/20      PT LONG TERM GOAL #3  Title Pt. will demonstrate FOTO  outcome > or = 73 to indicated reduced disability.    Status On-going    Target Date 12/22/20      PT LONG TERM GOAL #4   Title Pt. will demonstrate Lt hip MMT 4+ or greater, knee ext Lt 5/5 , dynamometry with 15% of Rt to facilitate stabilty in ambulation.    Status Partially Met    Target Date 12/22/20      PT LONG TERM GOAL #5   Title Pt. will demonstrate bilateral SLS > 15 seconds to facilitate stability in ambulation at PLOF.    Status On-going    Target Date 12/22/20      PT LONG TERM GOAL #6   Title Pt. will indicate ability to walk and bike at PLOF s limitation.    Status On-going    Target Date 12/22/20                 Plan - 11/24/20 0936    Clinical Impression Statement Pt. has demonstrated improved bilateral hip flexion strength, Lt knee extension strength as compared to evaluation.  Hip abduciton strength to be gained as documented.  Continued skilled PT services c progression in balance as appropriate c focus on nearing transitioning to HEP.    Personal Factors and Comorbidities Comorbidity 3+    Comorbidities HTN, Hyperlipidemia, history of skin cancer, history  L1 fx. s/p ALIF at L2-3 and L3-4 and PLIF at L2-L4    Examination-Activity Limitations Squat;Sit;Stairs;Stand;Locomotion Level;Transfers    Examination-Participation Restrictions Community Activity;Shop;Yard Work;Other    Stability/Clinical Decision Making Stable/Uncomplicated    Rehab Potential Good    PT Duration 8 weeks    PT Treatment/Interventions ADLs/Self Care Home Management;Cryotherapy;Electrical Stimulation;Iontophoresis 4mg /ml Dexamethasone;Moist Heat;Balance training;Therapeutic exercise;Therapeutic activities;Functional mobility training;Stair training;Gait training;DME Instruction;Ultrasound;Neuromuscular re-education;Patient/family education;Dry needling;Joint Manipulations;Passive range of motion;Taping;Manual techniques    PT Next Visit Plan Non compliant/compliant surface balance, hip  abduction strengthening.    PT Home Exercise Plan [email or web address]    Consulted and Agree with Plan of Care Patient           Patient will benefit from skilled therapeutic intervention in order to improve the following deficits and impairments:  Abnormal gait,Decreased endurance,Hypomobility,Pain,Decreased strength,Decreased activity tolerance,Decreased balance,Decreased mobility,Difficulty walking,Improper body mechanics,Impaired perceived functional ability,Impaired flexibility,Decreased coordination,Decreased range of motion  Visit Diagnosis: Pain in left hip  Muscle weakness (generalized)  Difficulty walking     Problem List Patient Active Problem List   Diagnosis Date Noted  . AMS (altered mental status) 10/03/2020  . Status post total replacement of right hip 07/08/2020  . Status post total replacement of left hip 07/07/2020  . Unilateral primary osteoarthritis, left hip 05/15/2020  . Unilateral primary osteoarthritis, right hip 05/15/2020  . Lumbar stenosis with neurogenic claudication 01/11/2020  . Subacute bronchitis 02/23/2019  . Left lumbar radiculopathy 04/08/2017  . Heart murmur, systolic 85/08/7739  . Hypersomnia 10/12/2015  . Obstructive sleep apnea 02/15/2011  . Foot pain 01/16/2011  . Metatarsalgia of right foot 11/15/2010  . Loss of transverse plantar arch 11/15/2010    Scot Jun, PT, DPT, OCS, ATC 11/24/20  10:06 AM    [facility] Physical Therapy [address] [place], [place], [address] Phone: [phone]   Fax:  [phone]  Name: Juan Hudson MRN: [id number] Date of Birth: [birth year]

## 2020-11-24 NOTE — Patient Instructions (Signed)
Access Code: [email or web address] URL: [email or web address] Date: 11/24/2020 Prepared by: Scot Jun  Exercises Supine Bridge - 2 x daily - 7 x weekly - 3 sets - 10 reps - 2 hold Seated Straight Leg Heel Taps - 1-2 x daily - 7 x weekly - 3 sets - 10 reps Standing Single Leg Stance with Counter Support - 1 x daily - 7 x weekly - 1 sets - 5 reps - 20-30 hold Sit to Stand - 1 x daily - 7 x weekly - 3 sets - 10 reps Standing Lumbar Extension at Wall - Forearms - 5 x daily - 7 x weekly - 1 sets - 5 reps - 3 seconds hold Supine Figure 4 Piriformis Stretch - 1 x daily - 7 x weekly - 3-5 reps - 30 seconds hold Supine Piriformis Stretch with Foot on Ground - 1 x daily - 7 x weekly - 3-5 reps - 30 seconds hold Hip Flexor Stretch on Step - 2 x daily - 7 x weekly - 3-5 reps - 30 seconds hold Sidelying Hip Abduction - 1 x daily - 7 x weekly - 3 sets - 10 reps Tandem Stance - 1 x daily - 7 x weekly - 1 sets - 5 reps - 30 hold

## 2020-11-28 ENCOUNTER — Ambulatory Visit: Payer: PPO | Admitting: Rehabilitative and Restorative Service Providers"

## 2020-11-28 ENCOUNTER — Other Ambulatory Visit: Payer: Self-pay

## 2020-11-28 ENCOUNTER — Encounter: Payer: Self-pay | Admitting: Rehabilitative and Restorative Service Providers"

## 2020-11-28 DIAGNOSIS — M6281 Muscle weakness (generalized): Secondary | ICD-10-CM

## 2020-11-28 DIAGNOSIS — R262 Difficulty in walking, not elsewhere classified: Secondary | ICD-10-CM

## 2020-11-28 DIAGNOSIS — M25552 Pain in left hip: Secondary | ICD-10-CM

## 2020-11-28 NOTE — Therapy (Signed)
[facility] Physical Therapy [address] [place], [place], [address] Phone: [phone]   Fax:  [phone]  Physical Therapy Treatment  Patient Details  Name: Juan Hudson MRN: [id number] Date of Birth: 1946/03/16 Referring Provider (PT): Lt THA Anterior   Encounter Date: 11/28/2020   PT End of Session - 11/28/20 0907    Visit Number 7    Number of Visits 12    Date for PT Re-Evaluation 12/22/20    Progress Note Due on Visit 10    PT Start Time 0840    PT Stop Time 0920    PT Time Calculation (min) 40 min    Activity Tolerance Patient tolerated treatment well;No increased pain    Behavior During Therapy WFL for tasks assessed/performed           Past Medical History:  Diagnosis Date  . Allergy   . Arthritis   . Cancer ([place])    skin cancer  . Cataract    early  . History of kidney stones   . HLD (hyperlipidemia)   . Hypertension   . PONV (postoperative nausea and vomiting)   . Rosacea   . Sleep apnea    cpap  . Systolic murmur     Past Surgical History:  Procedure Laterality Date  . ABDOMINAL HERNIA REPAIR     x2  . ANTERIOR LAT LUMBAR FUSION N/A 01/11/2020   Procedure: Lumbar Two-three Lumbar Three-Four Anterolateral decompression/fusion;  Surgeon: Kristeen Miss, MD;  Location: [place];  Service: Neurosurgery;  Laterality: N/A;  anterolateral  . APPLICATION OF ROBOTIC ASSISTANCE FOR SPINAL PROCEDURE N/A 01/11/2020   Procedure: APPLICATION OF ROBOTIC ASSISTANCE FOR SPINAL PROCEDURE;  Surgeon: Kristeen Miss, MD;  Location: [place];  Service: Neurosurgery;  Laterality: N/A;  posterior  . COLONOSCOPY  04-01-2003   tics and hems  . HEMORRHOID SURGERY    . INGUINAL HERNIA REPAIR    . LUMBAR PERCUTANEOUS PEDICLE SCREW 2 LEVEL N/A 01/11/2020   Procedure: Percutaneous pedicle screw fixation from Lumbar Two to Lumbar Four;  Surgeon: Kristeen Miss, MD;  Location: [place];  Service: Neurosurgery;  Laterality: N/A;  posterior  . NOSE SURGERY    .  TONSILLECTOMY    . TOTAL HIP ARTHROPLASTY Right 07/07/2020   Procedure: RIGHT TOTAL HIP ARTHROPLASTY ANTERIOR APPROACH;  Surgeon: Mcarthur Rossetti, MD;  Location: [facility];  Service: Orthopedics;  Laterality: Right;  . TOTAL HIP ARTHROPLASTY Left 09/29/2020   Procedure: LEFT TOTAL HIP ARTHROPLASTY ANTERIOR APPROACH;  Surgeon: Mcarthur Rossetti, MD;  Location: [facility];  Service: Orthopedics;  Laterality: Left;  . uvuloplasty      There were no vitals filed for this visit.   Subjective Assessment - 11/28/20 0902    Subjective Pt. stated he didn't perform the new leg raise to side since last visit.  Pt. indicated back still noted in morning but improved quickly c stretching/moving around.    Pertinent History HTN, Hyperlipidemia, history of skin cancer  subacute of L1 fx. s/p ALIF at L2-3 and L3-4 and PLIF at L2-L4. PMHx significant for skin CA, HTN, and HLD.    Limitations Walking;Standing    Patient Stated Goals Reduce pain, walk 5 miles for walking group, ride bicycle 20 mins    Currently in Pain? No/denies    Pain Score 0-No pain                             [facility] Adult PT Treatment/Exercise -  11/28/20 0001      Neuro Re-ed    Neuro Re-ed Details  tandem stance 1 min bilateral, tandem ambulation fwd/back in bars on foam 8 ft x 6 each way   occasional hand assist required at times     Knee/Hip Exercises: Aerobic   Recumbent Bike Lvl 5 10 mins      Knee/Hip Exercises: Machines for Strengthening   Cybex Knee Extension eccentric Lt leg lowering 4 x 10 15 lbs      Knee/Hip Exercises: Standing   Lateral Step Up Step Height: 4";2 sets;10 reps;Both   lateral step down focus c hips level                   PT Short Term Goals - 11/24/20 0955      PT SHORT TERM GOAL #1   Title Patient will demonstrate independent use of home exercise program to maintain progress from in clinic treatments. 3    Status Achieved             PT Long Term Goals -  11/24/20 0957      PT LONG TERM GOAL #1   Title Patient will demonstrate/report pain at worst less than or equal to 2/10 to facilitate minimal limitation in daily activity secondary to pain symptoms.    Status On-going    Target Date 12/22/20      PT LONG TERM GOAL #2   Title Patient will demonstrate independent use of home exercise program to facilitate ability to maintain/progress functional gains from skilled physical therapy services. 8    Status On-going    Target Date 12/22/20      PT LONG TERM GOAL #3   Title Pt. will demonstrate FOTO outcome > or = 73 to indicated reduced disability.    Status On-going    Target Date 12/22/20      PT LONG TERM GOAL #4   Title Pt. will demonstrate Lt hip MMT 4+ or greater, knee ext Lt 5/5 , dynamometry with 15% of Rt to facilitate stabilty in ambulation.    Status Partially Met    Target Date 12/22/20      PT LONG TERM GOAL #5   Title Pt. will demonstrate bilateral SLS > 15 seconds to facilitate stability in ambulation at PLOF.    Status On-going    Target Date 12/22/20      PT LONG TERM GOAL #6   Title Pt. will indicate ability to walk and bike at PLOF s limitation.    Status On-going    Target Date 12/22/20                 Plan - 11/28/20 8280    Clinical Impression Statement Pt. demonstrated increased difficulty and lack of control on Rt leg step down eccentric control vs. Lt but can improve both to improve functional movement.  Continued emphasis on use of strengthening for lateral/posterior hip and balance improvements.    Personal Factors and Comorbidities Comorbidity 3+    Comorbidities HTN, Hyperlipidemia, history of skin cancer, history  L1 fx. s/p ALIF at L2-3 and L3-4 and PLIF at L2-L4    Examination-Activity Limitations Squat;Sit;Stairs;Stand;Locomotion Level;Transfers    Examination-Participation Restrictions Community Activity;Shop;Yard Work;Other    Stability/Clinical Decision Making Stable/Uncomplicated    Rehab  Potential Good    PT Duration 8 weeks    PT Treatment/Interventions ADLs/Self Care Home Management;Cryotherapy;Electrical Stimulation;Iontophoresis 2m/ml Dexamethasone;Moist Heat;Balance training;Therapeutic exercise;Therapeutic activities;Functional mobility training;Stair training;Gait training;DME Instruction;Ultrasound;Neuromuscular re-education;Patient/family education;Dry needling;Joint Manipulations;Passive  range of motion;Taping;Manual techniques    PT Next Visit Plan Non compliant/compliant surface balance, hip abduction strengthening, eccentric loading quads/leg    PT Home Exercise Plan [id number]    Consulted and Agree with Plan of Care Patient           Patient will benefit from skilled therapeutic intervention in order to improve the following deficits and impairments:  Abnormal gait,Decreased endurance,Hypomobility,Pain,Decreased strength,Decreased activity tolerance,Decreased balance,Decreased mobility,Difficulty walking,Improper body mechanics,Impaired perceived functional ability,Impaired flexibility,Decreased coordination,Decreased range of motion  Visit Diagnosis: Pain in left hip  Muscle weakness (generalized)  Difficulty walking     Problem List Patient Active Problem List   Diagnosis Date Noted  . AMS (altered mental status) 10/03/2020  . Status post total replacement of right hip 07/08/2020  . Status post total replacement of left hip 07/07/2020  . Unilateral primary osteoarthritis, left hip 05/15/2020  . Unilateral primary osteoarthritis, right hip 05/15/2020  . Lumbar stenosis with neurogenic claudication 01/11/2020  . Subacute bronchitis 02/23/2019  . Left lumbar radiculopathy 04/08/2017  . Heart murmur, systolic 15/10/1362  . Hypersomnia 10/12/2015  . Obstructive sleep apnea 02/15/2011  . Foot pain 01/16/2011  . Metatarsalgia of right foot 11/15/2010  . Loss of transverse plantar arch 11/15/2010    Scot Jun, PT, DPT, OCS, ATC 11/28/20  9:26  AM    [facility] Physical Therapy [address] [place], [place], [address] Phone: [phone]   Fax:  [phone]  Name: Juan Hudson MRN: [id number] Date of Birth: [birth year]

## 2020-11-30 ENCOUNTER — Encounter: Payer: PPO | Admitting: Rehabilitative and Restorative Service Providers"

## 2020-12-01 ENCOUNTER — Encounter: Payer: PPO | Admitting: Rehabilitative and Restorative Service Providers"

## 2020-12-04 ENCOUNTER — Ambulatory Visit: Payer: PPO | Admitting: Rehabilitative and Restorative Service Providers"

## 2020-12-04 ENCOUNTER — Other Ambulatory Visit: Payer: Self-pay

## 2020-12-04 ENCOUNTER — Encounter: Payer: Self-pay | Admitting: Rehabilitative and Restorative Service Providers"

## 2020-12-04 DIAGNOSIS — M6281 Muscle weakness (generalized): Secondary | ICD-10-CM | POA: Diagnosis not present

## 2020-12-04 DIAGNOSIS — M25552 Pain in left hip: Secondary | ICD-10-CM | POA: Diagnosis not present

## 2020-12-04 DIAGNOSIS — R262 Difficulty in walking, not elsewhere classified: Secondary | ICD-10-CM

## 2020-12-04 NOTE — Therapy (Signed)
[facility] Physical Therapy [address] [place], [place], [address] Phone: [phone]   Fax:  [phone]  Physical Therapy Treatment/Progress Note  Patient Details  Name: Juan Hudson MRN: [id number] Date of Birth: [birth year] Referring Provider (PT): Lt THA Anterior   Encounter Date: 12/04/2020   Progress Note Reporting Period 10/27/2020 to 12/04/2020  See note below for Objective Data and Assessment of Progress/Goals.        PT End of Session - 12/04/20 0924    Visit Number 8    Number of Visits 16   extended number to accomdate until certification expiration   Date for PT Re-Evaluation 12/22/20    Progress Note Due on Visit 18    PT Start Time 0925    PT Stop Time 1005    PT Time Calculation (min) 40 min    Activity Tolerance Patient tolerated treatment well;No increased pain    Behavior During Therapy WFL for tasks assessed/performed           Past Medical History:  Diagnosis Date  . Allergy   . Arthritis   . Cancer ([place])    skin cancer  . Cataract    early  . History of kidney stones   . HLD (hyperlipidemia)   . Hypertension   . PONV (postoperative nausea and vomiting)   . Rosacea   . Sleep apnea    cpap  . Systolic murmur     Past Surgical History:  Procedure Laterality Date  . ABDOMINAL HERNIA REPAIR     x2  . ANTERIOR LAT LUMBAR FUSION N/A 01/11/2020   Procedure: Lumbar Two-three Lumbar Three-Four Anterolateral decompression/fusion;  Surgeon: Kristeen Miss, MD;  Location: [place];  Service: Neurosurgery;  Laterality: N/A;  anterolateral  . APPLICATION OF ROBOTIC ASSISTANCE FOR SPINAL PROCEDURE N/A 01/11/2020   Procedure: APPLICATION OF ROBOTIC ASSISTANCE FOR SPINAL PROCEDURE;  Surgeon: Kristeen Miss, MD;  Location: [place];  Service: Neurosurgery;  Laterality: N/A;  posterior  . COLONOSCOPY  04-01-2003   tics and hems  . HEMORRHOID SURGERY    . INGUINAL HERNIA REPAIR    . LUMBAR PERCUTANEOUS PEDICLE SCREW 2 LEVEL N/A 01/11/2020    Procedure: Percutaneous pedicle screw fixation from Lumbar Two to Lumbar Four;  Surgeon: Kristeen Miss, MD;  Location: [place];  Service: Neurosurgery;  Laterality: N/A;  posterior  . NOSE SURGERY    . TONSILLECTOMY    . TOTAL HIP ARTHROPLASTY Right 07/07/2020   Procedure: RIGHT TOTAL HIP ARTHROPLASTY ANTERIOR APPROACH;  Surgeon: Mcarthur Rossetti, MD;  Location: [facility];  Service: Orthopedics;  Laterality: Right;  . TOTAL HIP ARTHROPLASTY Left 09/29/2020   Procedure: LEFT TOTAL HIP ARTHROPLASTY ANTERIOR APPROACH;  Surgeon: Mcarthur Rossetti, MD;  Location: [facility];  Service: Orthopedics;  Laterality: Left;  . uvuloplasty      There were no vitals filed for this visit.   Subjective Assessment - 12/04/20 0927    Subjective Pt. indicated he went on bike ride with friends that led to sore muscles in Rt groin afterwards.  Pt. indicated Lt hip doing a little bit better.  Pt. stated only pain was soreness/pain after bike ride on Rt hip.  Pt. indicated also having some back pain/tightness complaints in morning but improved c HEP and mobility interventions.    Pertinent History HTN, Hyperlipidemia, history of skin cancer  subacute of L1 fx. s/p ALIF at L2-3 and L3-4 and PLIF at L2-L4. PMHx significant for skin CA, HTN, and HLD.    Limitations  Walking;Standing    Patient Stated Goals Reduce pain, walk 5 miles for walking group, ride bicycle 20 mins    Currently in Pain? Yes    Pain Score 2     Pain Location Hip    Pain Orientation Right              [facility] PT Assessment - 12/04/20 0001      Assessment   Medical Diagnosis Dr. Ninfa Linden    Referring Provider (PT) Lt THA Anterior    Onset Date/Surgical Date 09/29/20    Hand Dominance Left      Single Leg Stance   Comments Lt SLS 7 seconds, Rt SLS 6 seconds      Strength   Right Hip ABduction 4/5   19.8 lbs.  rated at 4 today due to longer duration of hold against resistance compared to last assessment   Left Hip ABduction 4/5    23.3 lbs   Left Knee Extension 5/5   58, 60 lbs                        [facility] Adult PT Treatment/Exercise - 12/04/20 0001      Neuro Re-ed    Neuro Re-ed Details  SLS bilateral 15 seconds x 5 c moderate hand assist on counter throughout      Knee/Hip Exercises: Aerobic   Recumbent Bike Lvl 5 12 mins      Knee/Hip Exercises: Seated   Other Seated Knee/Hip Exercises seated slr 2 x 10 bilateral    Sit to Sand without UE support;10 reps      Knee/Hip Exercises: Supine   Bridges Both;20 reps   5 second hold     Knee/Hip Exercises: Sidelying   Hip ABduction 2 sets;10 reps;Both   cues for positioning to prevent compensation                   PT Short Term Goals - 11/24/20 0955      PT SHORT TERM GOAL #1   Title Patient will demonstrate independent use of home exercise program to maintain progress from in clinic treatments. 3    Status Achieved             PT Long Term Goals - 12/04/20 0955      PT LONG TERM GOAL #1   Title Patient will demonstrate/report pain at worst less than or equal to 2/10 to facilitate minimal limitation in daily activity secondary to pain symptoms.    Baseline 12/04/2020: Lt hip no pain, Rt hip has pain as documented    Time 8    Period Weeks    Status Partially Met    Target Date 12/22/20      PT LONG TERM GOAL #2   Title Patient will demonstrate independent use of home exercise program to facilitate ability to maintain/progress functional gains from skilled physical therapy services. 8    Time 8    Period Weeks    Status On-going    Target Date 12/22/20      PT LONG TERM GOAL #3   Title Pt. will demonstrate FOTO outcome > or = 73 to indicated reduced disability.    Time 8    Period Weeks    Status On-going    Target Date 12/22/20      PT LONG TERM GOAL #4   Title Pt. will demonstrate Lt hip MMT 4+ or greater, knee ext Lt 5/5 , dynamometry with 15%  of Rt to facilitate stabilty in ambulation.    Time 8    Period  Weeks    Status Partially Met    Target Date 12/22/20      PT LONG TERM GOAL #5   Title Pt. will demonstrate bilateral SLS > 15 seconds to facilitate stability in ambulation at PLOF.    Status On-going      PT LONG TERM GOAL #6   Title Pt. will indicate ability to walk and bike at PLOF s limitation.    Time 8    Status On-going    Target Date 12/22/20                 Plan - 12/04/20 0946    Clinical Impression Statement Pt. has attended 8 visits overall during course of treatment.  Pt. has reported reduced complaints from Lt hip with main complaints noted recently from low back in morning as well as recent Rt groin symptoms noted (sore description) after increased bike ride activity in last week or so.  See objective data for updated information.  Pt. has made some improvements in documented.  Pt. may continue to benefit from skilled PT services paired c HEP to address remaing strength/endurance and movement coordination deficits that impair daily function from PLOF s difficulty.    Personal Factors and Comorbidities Comorbidity 3+    Comorbidities HTN, Hyperlipidemia, history of skin cancer, history  L1 fx. s/p ALIF at L2-3 and L3-4 and PLIF at L2-L4    Examination-Activity Limitations Squat;Sit;Stairs;Stand;Locomotion Level;Transfers    Examination-Participation Restrictions Community Activity;Shop;Yard Work;Other    Stability/Clinical Decision Making Stable/Uncomplicated    Rehab Potential Good    PT Frequency Other (comment)   1-2x/week   PT Duration 8 weeks    PT Treatment/Interventions ADLs/Self Care Home Management;Cryotherapy;Electrical Stimulation;Iontophoresis 78m/ml Dexamethasone;Moist Heat;Balance training;Therapeutic exercise;Therapeutic activities;Functional mobility training;Stair training;Gait training;DME Instruction;Ultrasound;Neuromuscular re-education;Patient/family education;Dry needling;Joint Manipulations;Passive range of motion;Taping;Manual techniques    PT  Next Visit Plan Returning to [place], can continue to benefit from Non compliant/compliant surface balance, hip abduction strengthening, eccentric loading quads/leg    PT Home Exercise Plan [id number]    Consulted and Agree with Plan of Care Patient           Patient will benefit from skilled therapeutic intervention in order to improve the following deficits and impairments:  Abnormal gait,Decreased endurance,Hypomobility,Pain,Decreased strength,Decreased activity tolerance,Decreased balance,Decreased mobility,Difficulty walking,Improper body mechanics,Impaired perceived functional ability,Impaired flexibility,Decreased coordination,Decreased range of motion  Visit Diagnosis: Pain in left hip  Muscle weakness (generalized)  Difficulty walking     Problem List Patient Active Problem List   Diagnosis Date Noted  . AMS (altered mental status) 10/03/2020  . Status post total replacement of right hip 07/08/2020  . Status post total replacement of left hip 07/07/2020  . Unilateral primary osteoarthritis, left hip 05/15/2020  . Unilateral primary osteoarthritis, right hip 05/15/2020  . Lumbar stenosis with neurogenic claudication 01/11/2020  . Subacute bronchitis 02/23/2019  . Left lumbar radiculopathy 04/08/2017  . Heart murmur, systolic 007/68/0881 . Hypersomnia 10/12/2015  . Obstructive sleep apnea 02/15/2011  . Foot pain 01/16/2011  . Metatarsalgia of right foot 11/15/2010  . Loss of transverse plantar arch 11/15/2010   MScot Jun PT, DPT, OCS, ATC 12/04/20  10:03 AM    [facility] [address] [address]Phone: [phone]  Fax:  [phone] Name: Juan HOLLOMONMRN: [id number] of Birth: 505/24/1947

## 2020-12-05 DIAGNOSIS — E291 Testicular hypofunction: Secondary | ICD-10-CM | POA: Diagnosis not present

## 2020-12-05 DIAGNOSIS — E785 Hyperlipidemia, unspecified: Secondary | ICD-10-CM | POA: Diagnosis not present

## 2020-12-05 DIAGNOSIS — L570 Actinic keratosis: Secondary | ICD-10-CM | POA: Diagnosis not present

## 2020-12-05 DIAGNOSIS — L821 Other seborrheic keratosis: Secondary | ICD-10-CM | POA: Diagnosis not present

## 2020-12-05 DIAGNOSIS — Z125 Encounter for screening for malignant neoplasm of prostate: Secondary | ICD-10-CM | POA: Diagnosis not present

## 2020-12-05 DIAGNOSIS — D225 Melanocytic nevi of trunk: Secondary | ICD-10-CM | POA: Diagnosis not present

## 2020-12-06 ENCOUNTER — Encounter: Payer: PPO | Admitting: Rehabilitative and Restorative Service Providers"

## 2020-12-07 ENCOUNTER — Encounter: Payer: Self-pay | Admitting: Orthopaedic Surgery

## 2020-12-07 ENCOUNTER — Ambulatory Visit (INDEPENDENT_AMBULATORY_CARE_PROVIDER_SITE_OTHER): Payer: PPO | Admitting: Orthopaedic Surgery

## 2020-12-07 DIAGNOSIS — Z96642 Presence of left artificial hip joint: Secondary | ICD-10-CM

## 2020-12-07 DIAGNOSIS — Z96641 Presence of right artificial hip joint: Secondary | ICD-10-CM

## 2020-12-07 NOTE — Progress Notes (Signed)
The patient is now 6 weeks status post a left total hip arthroplasty and 5 months status post a right total hip arthroplasty.  He is 75 years old.  He reports good range of motion and strength as well as improving balance and coordination.  He is reporting low back pain when he gets up in the morning but he does have a knee mattress.  He is walking without a limp.  He is slow to get up out of a chair but otherwise looks great.  Both hips have full and fluid motion to them.  His leg lengths are equal.  He will continue to increase his activities as comfort allows.  The next time you need to see him back in 6 months.  We will have a standing low AP pelvis and lateral both hips at that visit.  All questions and concerns were answered and addressed.  If there are issues before then he needs to come see [place]

## 2020-12-08 ENCOUNTER — Ambulatory Visit: Payer: PPO | Admitting: Rehabilitative and Restorative Service Providers"

## 2020-12-08 ENCOUNTER — Encounter: Payer: Self-pay | Admitting: Rehabilitative and Restorative Service Providers"

## 2020-12-08 ENCOUNTER — Other Ambulatory Visit: Payer: Self-pay

## 2020-12-08 DIAGNOSIS — M25552 Pain in left hip: Secondary | ICD-10-CM | POA: Diagnosis not present

## 2020-12-08 DIAGNOSIS — R262 Difficulty in walking, not elsewhere classified: Secondary | ICD-10-CM | POA: Diagnosis not present

## 2020-12-08 DIAGNOSIS — M6281 Muscle weakness (generalized): Secondary | ICD-10-CM

## 2020-12-08 NOTE — Therapy (Signed)
[facility] Therapy [address] [place], [place], [address] Phone: [phone]   Fax:  [phone]  Physical Therapy Treatment  Patient Details  Name: Juan Hudson MRN: [id number] Date of Birth: [birth year] Referring Provider (PT): Lt THA Anterior   Encounter Date: 12/08/2020   PT End of Session - 12/08/20 0849    Visit Number 9    Number of Visits 16   extended number to accomdate until certification expiration   Date for PT Re-Evaluation 12/22/20    Progress Note Due on Visit 18    PT Start Time 0844    PT Stop Time 0924    PT Time Calculation (min) 40 min    Activity Tolerance Patient tolerated treatment well;No increased pain    Behavior During Therapy WFL for tasks assessed/performed           Past Medical History:  Diagnosis Date  . Allergy   . Arthritis   . Cancer ([place])    skin cancer  . Cataract    early  . History of kidney stones   . HLD (hyperlipidemia)   . Hypertension   . PONV (postoperative nausea and vomiting)   . Rosacea   . Sleep apnea    cpap  . Systolic murmur     Past Surgical History:  Procedure Laterality Date  . ABDOMINAL HERNIA REPAIR     x2  . ANTERIOR LAT LUMBAR FUSION N/A 01/11/2020   Procedure: Lumbar Two-three Lumbar Three-Four Anterolateral decompression/fusion;  Surgeon: Kristeen Miss, MD;  Location: [place];  Service: Neurosurgery;  Laterality: N/A;  anterolateral  . APPLICATION OF ROBOTIC ASSISTANCE FOR SPINAL PROCEDURE N/A 01/11/2020   Procedure: APPLICATION OF ROBOTIC ASSISTANCE FOR SPINAL PROCEDURE;  Surgeon: Kristeen Miss, MD;  Location: [place];  Service: Neurosurgery;  Laterality: N/A;  posterior  . COLONOSCOPY  04-01-2003   tics and hems  . HEMORRHOID SURGERY    . INGUINAL HERNIA REPAIR    . LUMBAR PERCUTANEOUS PEDICLE SCREW 2 LEVEL N/A 01/11/2020   Procedure: Percutaneous pedicle screw fixation from Lumbar Two to Lumbar Four;  Surgeon: Kristeen Miss, MD;  Location: [place];  Service:  Neurosurgery;  Laterality: N/A;  posterior  . NOSE SURGERY    . TONSILLECTOMY    . TOTAL HIP ARTHROPLASTY Right 07/07/2020   Procedure: RIGHT TOTAL HIP ARTHROPLASTY ANTERIOR APPROACH;  Surgeon: Mcarthur Rossetti, MD;  Location: [facility];  Service: Orthopedics;  Laterality: Right;  . TOTAL HIP ARTHROPLASTY Left 09/29/2020   Procedure: LEFT TOTAL HIP ARTHROPLASTY ANTERIOR APPROACH;  Surgeon: Mcarthur Rossetti, MD;  Location: [facility];  Service: Orthopedics;  Laterality: Left;  . uvuloplasty      There were no vitals filed for this visit.   Subjective Assessment - 12/08/20 0847    Subjective Pt. indicated MD visit went well with plan to return to MD in approx. 6 months.  Pt. indicated he is going to do exercise routine at workout location and he felt like that would help him continue to perform discussed exercises. Mentioned back complaints in morning again but better last few days.    Pertinent History HTN, Hyperlipidemia, history of skin cancer  subacute of L1 fx. s/p ALIF at L2-3 and L3-4 and PLIF at L2-L4. PMHx significant for skin CA, HTN, and HLD.    Limitations Walking;Standing    Patient Stated Goals Reduce pain, walk 5 miles for walking group, ride bicycle 20 mins    Currently in Pain? No/denies    Pain Score 0-No  pain   no pain upon arrival.                            [facility] Adult PT Treatment/Exercise - 12/08/20 0001      Neuro Re-ed    Neuro Re-ed Details  3 point slider (fwd/lateral/back) x 10 bilateral, tandem stance 1 min x 2 bilateral      Knee/Hip Exercises: Machines for Strengthening   Cybex Leg Press 75 lbs 2 x 15 bilateral, single leg 37 lbs 2 x 10 each      Knee/Hip Exercises: Standing   Lateral Step Up 2 sets;10 reps;Step Height: 4";Hand Hold: 0;Both      Knee/Hip Exercises: Supine   Bridges 20 reps;Both   5 sec hold     Knee/Hip Exercises: Sidelying   Hip ABduction 2 sets;10 reps;Both                    PT Short Term Goals  - 11/24/20 0955      PT SHORT TERM GOAL #1   Title Patient will demonstrate independent use of home exercise program to maintain progress from in clinic treatments. 3    Status Achieved             PT Long Term Goals - 12/04/20 0955      PT LONG TERM GOAL #1   Title Patient will demonstrate/report pain at worst less than or equal to 2/10 to facilitate minimal limitation in daily activity secondary to pain symptoms.    Baseline 12/04/2020: Lt hip no pain, Rt hip has pain as documented    Time 8    Period Weeks    Status Partially Met    Target Date 12/22/20      PT LONG TERM GOAL #2   Title Patient will demonstrate independent use of home exercise program to facilitate ability to maintain/progress functional gains from skilled physical therapy services. 8    Time 8    Period Weeks    Status On-going    Target Date 12/22/20      PT LONG TERM GOAL #3   Title Pt. will demonstrate FOTO outcome > or = 73 to indicated reduced disability.    Time 8    Period Weeks    Status On-going    Target Date 12/22/20      PT LONG TERM GOAL #4   Title Pt. will demonstrate Lt hip MMT 4+ or greater, knee ext Lt 5/5 , dynamometry with 15% of Rt to facilitate stabilty in ambulation.    Time 8    Period Weeks    Status Partially Met    Target Date 12/22/20      PT LONG TERM GOAL #5   Title Pt. will demonstrate bilateral SLS > 15 seconds to facilitate stability in ambulation at PLOF.    Status On-going      PT LONG TERM GOAL #6   Title Pt. will indicate ability to walk and bike at PLOF s limitation.    Time 8    Status On-going    Target Date 12/22/20                 Plan - 12/08/20 3295    Clinical Impression Statement Pt. continued to show deficits in eccentric step down and movmeent coordination balance intervention and can continue to benefit from interventions to improve through quad/hip strengthening and non compliant to compliant balance interventions.  Personal Factors  and Comorbidities Comorbidity 3+    Comorbidities HTN, Hyperlipidemia, history of skin cancer, history  L1 fx. s/p ALIF at L2-3 and L3-4 and PLIF at L2-L4    Examination-Activity Limitations Squat;Sit;Stairs;Stand;Locomotion Level;Transfers    Examination-Participation Restrictions Community Activity;Shop;Yard Work;Other    Stability/Clinical Decision Making Stable/Uncomplicated    Rehab Potential Good    PT Frequency Other (comment)   1-2x/week   PT Duration 8 weeks    PT Treatment/Interventions ADLs/Self Care Home Management;Cryotherapy;Electrical Stimulation;Iontophoresis 4mg /ml Dexamethasone;Moist Heat;Balance training;Therapeutic exercise;Therapeutic activities;Functional mobility training;Stair training;Gait training;DME Instruction;Ultrasound;Neuromuscular re-education;Patient/family education;Dry needling;Joint Manipulations;Passive range of motion;Taping;Manual techniques    PT Next Visit Plan Can continue to benefit from Non compliant/compliant surface balance, hip abduction strengthening, eccentric loading quads/leg.  Anticipating transitioning to home plan in next few visits per Pt. preference.    PT Home Exercise Plan [id number]    Consulted and Agree with Plan of Care Patient           Patient will benefit from skilled therapeutic intervention in order to improve the following deficits and impairments:  Abnormal gait,Decreased endurance,Hypomobility,Pain,Decreased strength,Decreased activity tolerance,Decreased balance,Decreased mobility,Difficulty walking,Improper body mechanics,Impaired perceived functional ability,Impaired flexibility,Decreased coordination,Decreased range of motion  Visit Diagnosis: Pain in left hip  Muscle weakness (generalized)  Difficulty in walking, not elsewhere classified     Problem List Patient Active Problem List   Diagnosis Date Noted  . AMS (altered mental status) 10/03/2020  . Status post total replacement of right hip 07/08/2020  .  Status post total replacement of left hip 07/07/2020  . Unilateral primary osteoarthritis, left hip 05/15/2020  . Unilateral primary osteoarthritis, right hip 05/15/2020  . Lumbar stenosis with neurogenic claudication 01/11/2020  . Subacute bronchitis 02/23/2019  . Left lumbar radiculopathy 04/08/2017  . Heart murmur, systolic 52/84/1324  . Hypersomnia 10/12/2015  . Obstructive sleep apnea 02/15/2011  . Foot pain 01/16/2011  . Metatarsalgia of right foot 11/15/2010  . Loss of transverse plantar arch 11/15/2010    Scot Jun, PT, DPT, OCS, ATC 12/08/20  9:24 AM    [facility] Physical [facility] [address] [place], [place], [address] Phone: [phone]   Fax:  [phone]  Name: Juan Hudson MRN: [id number] Date of Birth: March 10, 1946

## 2020-12-12 DIAGNOSIS — I351 Nonrheumatic aortic (valve) insufficiency: Secondary | ICD-10-CM | POA: Diagnosis not present

## 2020-12-12 DIAGNOSIS — I350 Nonrheumatic aortic (valve) stenosis: Secondary | ICD-10-CM | POA: Diagnosis not present

## 2020-12-12 DIAGNOSIS — Z1212 Encounter for screening for malignant neoplasm of rectum: Secondary | ICD-10-CM | POA: Diagnosis not present

## 2020-12-12 DIAGNOSIS — Z0000 Encounter for general adult medical examination without abnormal findings: Secondary | ICD-10-CM | POA: Diagnosis not present

## 2020-12-12 DIAGNOSIS — Z23 Encounter for immunization: Secondary | ICD-10-CM | POA: Diagnosis not present

## 2020-12-12 DIAGNOSIS — I10 Essential (primary) hypertension: Secondary | ICD-10-CM | POA: Diagnosis not present

## 2020-12-12 DIAGNOSIS — E291 Testicular hypofunction: Secondary | ICD-10-CM | POA: Diagnosis not present

## 2020-12-12 DIAGNOSIS — E785 Hyperlipidemia, unspecified: Secondary | ICD-10-CM | POA: Diagnosis not present

## 2020-12-12 DIAGNOSIS — R82998 Other abnormal findings in urine: Secondary | ICD-10-CM | POA: Diagnosis not present

## 2020-12-12 DIAGNOSIS — I2510 Atherosclerotic heart disease of native coronary artery without angina pectoris: Secondary | ICD-10-CM | POA: Diagnosis not present

## 2020-12-12 DIAGNOSIS — R011 Cardiac murmur, unspecified: Secondary | ICD-10-CM | POA: Diagnosis not present

## 2020-12-12 DIAGNOSIS — R413 Other amnesia: Secondary | ICD-10-CM | POA: Diagnosis not present

## 2020-12-14 ENCOUNTER — Encounter: Payer: Self-pay | Admitting: Rehabilitative and Restorative Service Providers"

## 2020-12-14 ENCOUNTER — Other Ambulatory Visit: Payer: Self-pay

## 2020-12-14 ENCOUNTER — Ambulatory Visit: Payer: PPO | Admitting: Rehabilitative and Restorative Service Providers"

## 2020-12-14 DIAGNOSIS — M25552 Pain in left hip: Secondary | ICD-10-CM | POA: Diagnosis not present

## 2020-12-14 DIAGNOSIS — M25652 Stiffness of left hip, not elsewhere classified: Secondary | ICD-10-CM | POA: Diagnosis not present

## 2020-12-14 DIAGNOSIS — M6281 Muscle weakness (generalized): Secondary | ICD-10-CM

## 2020-12-14 DIAGNOSIS — R262 Difficulty in walking, not elsewhere classified: Secondary | ICD-10-CM

## 2020-12-14 NOTE — Therapy (Signed)
[facility] Physical Therapy [address] [place], [place], [address] Phone: [phone]   Fax:  [phone]  Physical Therapy Treatment  Patient Details  Name: Juan Hudson MRN: [id number] Date of Birth: 05-17-1946 Referring Provider (PT): Lt THA Anterior   Encounter Date: 12/14/2020   PT End of Session - 12/14/20 1607    Visit Number 10    Number of Visits 16   extended number to accomdate until certification expiration   Date for PT Re-Evaluation 12/22/20    Progress Note Due on Visit 18    PT Start Time 0845    PT Stop Time 0930    PT Time Calculation (min) 45 min    Activity Tolerance Patient tolerated treatment well;No increased pain    Behavior During Therapy WFL for tasks assessed/performed           Past Medical History:  Diagnosis Date  . Allergy   . Arthritis   . Cancer ([place])    skin cancer  . Cataract    early  . History of kidney stones   . HLD (hyperlipidemia)   . Hypertension   . PONV (postoperative nausea and vomiting)   . Rosacea   . Sleep apnea    cpap  . Systolic murmur     Past Surgical History:  Procedure Laterality Date  . ABDOMINAL HERNIA REPAIR     x2  . ANTERIOR LAT LUMBAR FUSION N/A 01/11/2020   Procedure: Lumbar Two-three Lumbar Three-Four Anterolateral decompression/fusion;  Surgeon: Kristeen Miss, MD;  Location: [place];  Service: Neurosurgery;  Laterality: N/A;  anterolateral  . APPLICATION OF ROBOTIC ASSISTANCE FOR SPINAL PROCEDURE N/A 01/11/2020   Procedure: APPLICATION OF ROBOTIC ASSISTANCE FOR SPINAL PROCEDURE;  Surgeon: Kristeen Miss, MD;  Location: [place];  Service: Neurosurgery;  Laterality: N/A;  posterior  . COLONOSCOPY  04-01-2003   tics and hems  . HEMORRHOID SURGERY    . INGUINAL HERNIA REPAIR    . LUMBAR PERCUTANEOUS PEDICLE SCREW 2 LEVEL N/A 01/11/2020   Procedure: Percutaneous pedicle screw fixation from Lumbar Two to Lumbar Four;  Surgeon: Kristeen Miss, MD;  Location: [place];  Service:  Neurosurgery;  Laterality: N/A;  posterior  . NOSE SURGERY    . TONSILLECTOMY    . TOTAL HIP ARTHROPLASTY Right 07/07/2020   Procedure: RIGHT TOTAL HIP ARTHROPLASTY ANTERIOR APPROACH;  Surgeon: Mcarthur Rossetti, MD;  Location[facility];  Service: Orthopedics;  Laterality: Right;  . TOTAL HIP ARTHROPLASTY Left 09/29/2020   Procedure: LEFT TOTAL HIP ARTHROPLASTY ANTERIOR APPROACH;  Surgeon: Mcarthur Rossetti, MD;  Location: [facility];  Service: Orthopedics;  Laterality: Left;  . uvuloplasty      There were no vitals filed for this visit.   Subjective Assessment - 12/14/20 0854    Subjective Juan Hudson reports difficulty with bike riding and with AM stiffness (mostly with his back).  Walking is progressing and he has minimal hip pain.    Pertinent History HTN, Hyperlipidemia, history of skin cancer  subacute of L1 fx. s/p ALIF at L2-3 and L3-4 and PLIF at L2-L4. PMHx significant for skin CA, HTN, and HLD.    Limitations Walking;Standing    Patient Stated Goals Reduce pain, walk 5 miles for walking group, ride bicycle 20 mins    Currently in Pain? No/denies    Pain Score 0-No pain    Pain Location Hip    Pain Orientation Right    Pain Descriptors / Indicators Tightness    Pain Onset More than a  month ago    Pain Frequency Occasional    Aggravating Factors  Start-up stiffness (mostly back) after prolonged postures (sitting and sleeping)    Pain Relieving Factors Stretches    Effect of Pain on Daily Activities Would like to move better without start-up stiffness    Multiple Pain Sites No                             [facility] Adult PT Treatment/Exercise - 12/14/20 0001      Therapeutic Activites    Therapeutic Activities ADL's    ADL's Log roll, reviewed basic spine anatomy and body mechanics to avoid injury      Knee/Hip Exercises: Stretches   Active Hamstring Stretch Both;4 reps;20 seconds    Hip Flexor Stretch Both;4 reps;20 seconds    Piriformis Stretch Both;4  reps;20 seconds   Figure 4   Other Knee/Hip Stretches Gluteal stretch 4X 20 seconds      Knee/Hip Exercises: Aerobic   Recumbent Bike Seat 7 for 8 minutes Level 6-7      Knee/Hip Exercises: Standing   Other Standing Knee Exercises Trunk extension AROM 10X 3 seconds      Knee/Hip Exercises: Seated   Sit to Sand without UE support;10 reps                  PT Education - 12/14/20 1604    Education Details Reviewed stretches for the hip and low back.  Discussed log roll and the importance of avoiding slouched postures, bending and twisting.    Person(s) Educated Patient    Methods Explanation;Demonstration;Verbal cues;Handout    Comprehension Verbal cues required;Need further instruction;Returned demonstration;Verbalized understanding            PT Short Term Goals - 12/14/20 1606      PT SHORT TERM GOAL #1   Title Patient will demonstrate independent use of home exercise program to maintain progress from in clinic treatments. 3    Status Achieved             PT Long Term Goals - 12/14/20 1606      PT LONG TERM GOAL #1   Title Patient will demonstrate/report pain at worst less than or equal to 2/10 to facilitate minimal limitation in daily activity secondary to pain symptoms.    Baseline 12/04/2020: Lt hip no pain, Rt hip has pain as documented    Time 8    Period Weeks    Status Partially Met      PT LONG TERM GOAL #2   Title Patient will demonstrate independent use of home exercise program to facilitate ability to maintain/progress functional gains from skilled physical therapy services. 8    Time 8    Period Weeks    Status On-going      PT LONG TERM GOAL #3   Title Pt. will demonstrate FOTO outcome > or = 73 to indicated reduced disability.    Time 8    Period Weeks    Status On-going      PT LONG TERM GOAL #4   Title Pt. will demonstrate Lt hip MMT 4+ or greater, knee ext Lt 5/5 , dynamometry with 15% of Rt to facilitate stabilty in ambulation.     Time 8    Period Weeks    Status Partially Met      PT LONG TERM GOAL #5   Title Pt. will demonstrate bilateral SLS > 15 seconds  to facilitate stability in ambulation at PLOF.    Status On-going      PT LONG TERM GOAL #6   Title Pt. will indicate ability to walk and bike at PLOF s limitation.    Baseline Walk is good, bike is aggravating his back    Time 8    Status On-going                 Plan - 12/14/20 1608    Clinical Impression Statement Juan Hudson is making great strides with his post-surgical hip physical therapy.  Gait quality is good but a bit stiff.  Juan Hudson has no functional complaints with his hips, but his low back has been aggravating him with bike rides and in general.  Reviewed hip stretching that should reduce low back strain and some postural education to avoid flaring-up his < 1 year post-surgical back.  We will review his stretches and activities on his next visit with some hip abductors and spine strength progressions PRN.    Personal Factors and Comorbidities Comorbidity 3+    Comorbidities HTN, Hyperlipidemia, history of skin cancer, history  L1 fx. s/p ALIF at L2-3 and L3-4 and PLIF at L2-L4    Examination-Activity Limitations Squat;Sit;Stairs;Stand;Locomotion Level;Transfers    Examination-Participation Restrictions Community Activity;Shop;Yard Work;Other    Stability/Clinical Decision Making Stable/Uncomplicated    Rehab Potential Good    PT Frequency Other (comment)   1-2x/week   PT Duration 8 weeks    PT Treatment/Interventions ADLs/Self Care Home Management;Cryotherapy;Electrical Stimulation;Iontophoresis 4mg /ml Dexamethasone;Moist Heat;Balance training;Therapeutic exercise;Therapeutic activities;Functional mobility training;Stair training;Gait training;DME Instruction;Ultrasound;Neuromuscular re-education;Patient/family education;Dry needling;Joint Manipulations;Passive range of motion;Taping;Manual techniques    PT Next Visit Plan Review HEP, progress hip  abductors and spine strength PRN    PT Home Exercise Plan [id number]    Consulted and Agree with Plan of Care Patient           Patient will benefit from skilled therapeutic intervention in order to improve the following deficits and impairments:  Abnormal gait,Decreased endurance,Hypomobility,Pain,Decreased strength,Decreased activity tolerance,Decreased balance,Decreased mobility,Difficulty walking,Improper body mechanics,Impaired perceived functional ability,Impaired flexibility,Decreased coordination,Decreased range of motion  Visit Diagnosis: Pain in left hip  Muscle weakness (generalized)  Difficulty in walking, not elsewhere classified  Stiffness of left hip, not elsewhere classified     Problem List Patient Active Problem List   Diagnosis Date Noted  . AMS (altered mental status) 10/03/2020  . Status post total replacement of right hip 07/08/2020  . Status post total replacement of left hip 07/07/2020  . Unilateral primary osteoarthritis, left hip 05/15/2020  . Unilateral primary osteoarthritis, right hip 05/15/2020  . Lumbar stenosis with neurogenic claudication 01/11/2020  . Subacute bronchitis 02/23/2019  . Left lumbar radiculopathy 04/08/2017  . Heart murmur, systolic 26/83/4196  . Hypersomnia 10/12/2015  . Obstructive sleep apnea 02/15/2011  . Foot pain 01/16/2011  . Metatarsalgia of right foot 11/15/2010  . Loss of transverse plantar arch 11/15/2010    Farley Ly PT, MPT 12/14/2020, 4:11 PM  [facility] Physical Therapy [address] [place], [place], [address] Phone: [phone]   Fax:  [phone]  Name: Juan Hudson MRN: [id number] Date of Birth: 1945/09/14

## 2020-12-19 ENCOUNTER — Encounter: Payer: PPO | Admitting: Physical Therapy

## 2020-12-21 ENCOUNTER — Other Ambulatory Visit: Payer: Self-pay

## 2020-12-21 ENCOUNTER — Ambulatory Visit: Payer: PPO | Admitting: Rehabilitative and Restorative Service Providers"

## 2020-12-21 ENCOUNTER — Encounter: Payer: Self-pay | Admitting: Rehabilitative and Restorative Service Providers"

## 2020-12-21 ENCOUNTER — Other Ambulatory Visit (HOSPITAL_COMMUNITY): Payer: Self-pay | Admitting: Internal Medicine

## 2020-12-21 DIAGNOSIS — M6281 Muscle weakness (generalized): Secondary | ICD-10-CM

## 2020-12-21 DIAGNOSIS — M25652 Stiffness of left hip, not elsewhere classified: Secondary | ICD-10-CM

## 2020-12-21 DIAGNOSIS — R262 Difficulty in walking, not elsewhere classified: Secondary | ICD-10-CM | POA: Diagnosis not present

## 2020-12-21 DIAGNOSIS — M25651 Stiffness of right hip, not elsewhere classified: Secondary | ICD-10-CM | POA: Diagnosis not present

## 2020-12-21 DIAGNOSIS — I350 Nonrheumatic aortic (valve) stenosis: Secondary | ICD-10-CM

## 2020-12-21 NOTE — Therapy (Signed)
[facility] [address] [place], [place], [address] Phone: [phone]   Fax:  [phone]  Physical Therapy Treatment  Patient Details  Name: Juan Hudson MRN: [id number] Date of Birth: 05/05/1946 Referring Provider (PT): Lt THA Anterior   Encounter Date: 12/21/2020   PT End of Session - 12/21/20 0850    Visit Number 11    Number of Visits 16   extended number to accomdate until certification expiration   Date for PT Re-Evaluation 12/22/20    Progress Note Due on Visit 18    PT Start Time 0802    PT Stop Time 0842    PT Time Calculation (min) 40 min    Activity Tolerance Patient tolerated treatment well;No increased pain    Behavior During Therapy WFL for tasks assessed/performed           Past Medical History:  Diagnosis Date  . Allergy   . Arthritis   . Cancer ([place])    skin cancer  . Cataract    early  . History of kidney stones   . HLD (hyperlipidemia)   . Hypertension   . PONV (postoperative nausea and vomiting)   . Rosacea   . Sleep apnea    cpap  . Systolic murmur     Past Surgical History:  Procedure Laterality Date  . ABDOMINAL HERNIA REPAIR     x2  . ANTERIOR LAT LUMBAR FUSION N/A 01/11/2020   Procedure: Lumbar Two-three Lumbar Three-Four Anterolateral decompression/fusion;  Surgeon: Kristeen Miss, MD;  Location: [place];  Service: Neurosurgery;  Laterality: N/A;  anterolateral  . APPLICATION OF ROBOTIC ASSISTANCE FOR SPINAL PROCEDURE N/A 01/11/2020   Procedure: APPLICATION OF ROBOTIC ASSISTANCE FOR SPINAL PROCEDURE;  Surgeon: Kristeen Miss, MD;  Location: [place];  Service: Neurosurgery;  Laterality: N/A;  posterior  . COLONOSCOPY  04-01-2003   tics and hems  . HEMORRHOID SURGERY    . INGUINAL HERNIA REPAIR    . LUMBAR PERCUTANEOUS PEDICLE SCREW 2 LEVEL N/A 01/11/2020   Procedure: Percutaneous pedicle screw fixation from Lumbar Two to Lumbar Four;  Surgeon: Kristeen Miss, MD;  Location: [place];  Service:  Neurosurgery;  Laterality: N/A;  posterior  . NOSE SURGERY    . TONSILLECTOMY    . TOTAL HIP ARTHROPLASTY Right 07/07/2020   Procedure: RIGHT TOTAL HIP ARTHROPLASTY ANTERIOR APPROACH;  Surgeon: Mcarthur Rossetti, MD;  [facility];  Service: Orthopedics;  Laterality: Right;  . TOTAL HIP ARTHROPLASTY Left 09/29/2020   Procedure: LEFT TOTAL HIP ARTHROPLASTY ANTERIOR APPROACH;  Surgeon: Mcarthur Rossetti, MD;  Location: [facility];  Service: Orthopedics;  Laterality: Left;  . uvuloplasty      There were no vitals filed for this visit.   Subjective Assessment - 12/21/20 0812    Subjective AM stiffness with the back is still an issue.  He has a hard time getting on and off a bike or motorcycle.  He also notes balance deficits.    Pertinent History HTN, Hyperlipidemia, history of skin cancer  subacute of L1 fx. s/p ALIF at L2-3 and L3-4 and PLIF at L2-L4. PMHx significant for skin CA, HTN, and HLD.    Limitations Walking;Standing    Patient Stated Goals Reduce pain, walk 5 miles for walking group, ride bicycle 20 mins    Currently in Pain? No/denies    Pain Score 0-No pain    Pain Location Hip    Pain Orientation Right    Pain Descriptors / Indicators Tightness  Pain Type Surgical pain    Pain Onset More than a month ago    Pain Frequency Occasional    Aggravating Factors  Start-up stiffness after prolonged postures (sitting and sleeping)    Pain Relieving Factors Stretches    Effect of Pain on Daily Activities Would like to move better with less start-up stiffness    Multiple Pain Sites No                             [facility] Adult PT Treatment/Exercise - 12/21/20 0001      Therapeutic Activites    Therapeutic Activities ADL's    ADL's Log roll, step-up and over with level pelvis      Neuro Re-ed    Neuro Re-ed Details  Tandem balance eyes open 6X 20 seconds and eyes closed 1X 20 seconds      Knee/Hip Exercises: Stretches   Active Hamstring Stretch  Both;4 reps;20 seconds    Hip Flexor Stretch Both;4 reps;20 seconds    Piriformis Stretch Both;4 reps;20 seconds   Figure 4   Other Knee/Hip Stretches Gluteal stretch 4X 20 seconds      Knee/Hip Exercises: Standing   Other Standing Knee Exercises Trunk extension AROM 10X 3 seconds      Knee/Hip Exercises: Seated   Sit to Sand without UE support;10 reps                  PT Education - 12/21/20 0848    Education Details Reviewed stretches and trunk extension AROM.  Progressed strength/function with step-up and over and balance/proprioception activities.    Person(s) Educated Patient    Methods Explanation;Demonstration;Verbal cues;Handout    Comprehension Verbalized understanding;Returned demonstration;Need further instruction;Verbal cues required            PT Short Term Goals - 12/21/20 0849      PT SHORT TERM GOAL #1   Title Patient will demonstrate independent use of home exercise program to maintain progress from in clinic treatments. 3    Status Achieved             PT Long Term Goals - 12/21/20 0849      PT LONG TERM GOAL #1   Title Patient will demonstrate/report pain at worst less than or equal to 2/10 to facilitate minimal limitation in daily activity secondary to pain symptoms.    Baseline Stiffness vs pain    Time 8    Period Weeks    Status Achieved      PT LONG TERM GOAL #2   Title Patient will demonstrate independent use of home exercise program to facilitate ability to maintain/progress functional gains from skilled physical therapy services. 8    Time 8    Period Weeks    Status On-going      PT LONG TERM GOAL #3   Title Pt. will demonstrate FOTO outcome > or = 73 to indicated reduced disability.    Time 8    Period Weeks    Status On-going      PT LONG TERM GOAL #4   Title Pt. will demonstrate Lt hip MMT 4+ or greater, knee ext Lt 5/5 , dynamometry with 15% of Rt to facilitate stabilty in ambulation.    Time 8    Period Weeks     Status Partially Met      PT LONG TERM GOAL #5   Title Pt. will demonstrate bilateral SLS > 15 seconds  to facilitate stability in ambulation at PLOF.    Status On-going      PT LONG TERM GOAL #6   Title Pt. will indicate ability to walk and bike at PLOF s limitation.    Baseline Walk is good, bike is aggravating his back    Time 8    Status On-going                 Plan - 12/21/20 0851    Clinical Impression Statement Kingsley is pleased with his current progress and would like to continue to make gains with flexibility, strength and function.  Balance and step/stair activities progressed today will benefit from continued work.  RA next visit or 2 depending on progress with his home program to assess Dan' readiness for independent rehabilitation.    Personal Factors and Comorbidities Comorbidity 3+    Comorbidities HTN, Hyperlipidemia, history of skin cancer, history  L1 fx. s/p ALIF at L2-3 and L3-4 and PLIF at L2-L4    Examination-Activity Limitations Squat;Sit;Stairs;Stand;Locomotion Level;Transfers    Examination-Participation Restrictions Community Activity;Shop;Yard Work;Other    Stability/Clinical Decision Making Stable/Uncomplicated    Rehab Potential Good    PT Frequency Other (comment)   1-2x/week   PT Duration 8 weeks    PT Treatment/Interventions ADLs/Self Care Home Management;Cryotherapy;Electrical Stimulation;Iontophoresis 4mg /ml Dexamethasone;Moist Heat;Balance training;Therapeutic exercise;Therapeutic activities;Functional mobility training;Stair training;Gait training;DME Instruction;Ultrasound;Neuromuscular re-education;Patient/family education;Dry needling;Joint Manipulations;Passive range of motion;Taping;Manual techniques    PT Next Visit Plan Review HEP, progress hip abductors and spine strength PRN    PT Home Exercise Plan [email or web address]    Consulted and Agree with Plan of Care Patient           Patient will benefit from skilled therapeutic intervention in  order to improve the following deficits and impairments:  Abnormal gait,Decreased endurance,Hypomobility,Pain,Decreased strength,Decreased activity tolerance,Decreased balance,Decreased mobility,Difficulty walking,Improper body mechanics,Impaired perceived functional ability,Impaired flexibility,Decreased coordination,Decreased range of motion  Visit Diagnosis: Difficulty in walking, not elsewhere classified  Muscle weakness (generalized)  Stiffness of left hip, not elsewhere classified  Stiffness of right hip, not elsewhere classified     Problem List Patient Active Problem List   Diagnosis Date Noted  . AMS (altered mental status) 10/03/2020  . Status post total replacement of right hip 07/08/2020  . Status post total replacement of left hip 07/07/2020  . Unilateral primary osteoarthritis, left hip 05/15/2020  . Unilateral primary osteoarthritis, right hip 05/15/2020  . Lumbar stenosis with neurogenic claudication 01/11/2020  . Subacute bronchitis 02/23/2019  . Left lumbar radiculopathy 04/08/2017  . Heart murmur, systolic 72/01/2181  . Hypersomnia 10/12/2015  . Obstructive sleep apnea 02/15/2011  . Foot pain 01/16/2011  . Metatarsalgia of right foot 11/15/2010  . Loss of transverse plantar arch 11/15/2010    Farley Ly PT, MPT 12/21/2020, 8:55 AM  [facility] Physical Therapy [address] [place], [place], [address] Phone: [phone]   Fax:  [phone]  Name: FRAZIER BALFOUR MRN: [id number] Date of Birth: 13-Jun-1946

## 2020-12-26 ENCOUNTER — Encounter (HOSPITAL_COMMUNITY): Payer: Self-pay | Admitting: Internal Medicine

## 2020-12-29 ENCOUNTER — Encounter: Payer: PPO | Admitting: Rehabilitative and Restorative Service Providers"

## 2021-01-04 ENCOUNTER — Encounter: Payer: Self-pay | Admitting: Rehabilitative and Restorative Service Providers"

## 2021-01-04 ENCOUNTER — Ambulatory Visit: Payer: PPO | Admitting: Rehabilitative and Restorative Service Providers"

## 2021-01-04 ENCOUNTER — Other Ambulatory Visit: Payer: Self-pay

## 2021-01-04 DIAGNOSIS — M25651 Stiffness of right hip, not elsewhere classified: Secondary | ICD-10-CM | POA: Diagnosis not present

## 2021-01-04 DIAGNOSIS — M6281 Muscle weakness (generalized): Secondary | ICD-10-CM

## 2021-01-04 DIAGNOSIS — R262 Difficulty in walking, not elsewhere classified: Secondary | ICD-10-CM

## 2021-01-04 DIAGNOSIS — M25652 Stiffness of left hip, not elsewhere classified: Secondary | ICD-10-CM

## 2021-01-04 NOTE — Patient Instructions (Signed)
Access Code: [email or web address] URL: [email or web address] Date: 01/04/2021 Prepared by: Vista Mink  Exercises Sit to Stand - 1 x daily - 7 x weekly - 2-3 sets - 5 reps Standing Lumbar Extension at Wall - Forearms - 5 x daily - 7 x weekly - 1 sets - 5 reps - 3 seconds hold Sidelying Hip Abduction - 1 x daily - 7 x weekly - 2-3 sets - 10 reps - 3 seconds hold Single Knee to Chest Stretch - 1-2 x daily - 7 x weekly - 1 sets - 5 reps - 20 seconds hold Supine Hamstring Stretch - 1-2 x daily - 7 x weekly - 1 sets - 5 reps - 20 seconds hold Supine Figure 4 Piriformis Stretch - 1-2 x daily - 7 x weekly - 1 sets - 5 reps - 20 seconds hold Supine Gluteus Stretch - 1-2 x daily - 7 x weekly - 1 sets - 5 reps - 20 seconds hold Tandem Stance - 1-2 x daily - 7 x weekly - 1 sets - 5 reps - 20 second hold Standing Hip Hiking - 1-2 x daily - 7 x weekly - 2 sets - 10 reps - 3 seconds hold

## 2021-01-04 NOTE — Therapy (Addendum)
[facility] [address] [place], [place], [address] Phone: [phone]   Fax:  [phone]  Physical Therapy Treatment/Discharge  Patient Details  Name: Juan Hudson MRN: [id number] Date of Birth: December 31, 1945 Referring Provider (PT): Dr. Ninfa Linden    Encounter Date: 01/04/2021   PT End of Session - 01/04/21 1025     Visit Number 12    Number of Visits 16   extended number to accomdate until certification expiration   Date for PT Re-Evaluation 12/22/20    Progress Note Due on Visit 18    PT Start Time 0848    PT Stop Time 8099    PT Time Calculation (min) 46 min    Activity Tolerance Patient tolerated treatment well;No increased pain;Patient limited by fatigue    Behavior During Therapy [facility] for tasks assessed/performed             Past Medical History:  Diagnosis Date   Allergy    Arthritis    Cancer ([place])    skin cancer   Cataract    early   History of kidney stones    HLD (hyperlipidemia)    Hypertension    PONV (postoperative nausea and vomiting)    Rosacea    Sleep apnea    cpap   Systolic murmur     Past Surgical History:  Procedure Laterality Date   ABDOMINAL HERNIA REPAIR     x2   ANTERIOR LAT LUMBAR FUSION N/A 01/11/2020   Procedure: Lumbar Two-three Lumbar Three-Four Anterolateral decompression/fusion;  Surgeon: Kristeen Miss, MD;  Location: [place];  Service: Neurosurgery;  Laterality: N/A;  anterolateral   APPLICATION OF ROBOTIC ASSISTANCE FOR SPINAL PROCEDURE N/A 01/11/2020   Procedure: APPLICATION OF ROBOTIC ASSISTANCE FOR SPINAL PROCEDURE;  Surgeon: Kristeen Miss, MD;  Location: [place];  Service: Neurosurgery;  Laterality: N/A;  posterior   COLONOSCOPY  04-01-2003   tics and hems   HEMORRHOID SURGERY     INGUINAL HERNIA REPAIR     LUMBAR PERCUTANEOUS PEDICLE SCREW 2 LEVEL N/A 01/11/2020   Procedure: Percutaneous pedicle screw fixation from Lumbar Two to Lumbar Four;  Surgeon: Kristeen Miss, MD;  Location: [place];  Service: Neurosurgery;  Laterality: N/A;  posterior   NOSE SURGERY     TONSILLECTOMY     TOTAL HIP ARTHROPLASTY Right 07/07/2020   Procedure: RIGHT TOTAL HIP ARTHROPLASTY ANTERIOR APPROACH;  Surgeon: Mcarthur Rossetti, MD;  Location: [facility];  Service: Orthopedics;  Laterality: Right;   TOTAL HIP ARTHROPLASTY Left 09/29/2020   Procedure: LEFT TOTAL HIP ARTHROPLASTY ANTERIOR APPROACH;  Surgeon: Mcarthur Rossetti, MD;  Location: [facility];  Service: Orthopedics;  Laterality: Left;   uvuloplasty      There were no vitals filed for this visit.   Subjective Assessment - 01/04/21 0903     Subjective Juan Hudson is pleased with his early progress with his PT.  Stiffness is improving as is WB endurance.    Pertinent History HTN, Hyperlipidemia, history of skin cancer  subacute of L1 fx. s/p ALIF at L2-3 and L3-4 and PLIF at L2-L4. PMHx significant for skin CA, HTN, and HLD.    Limitations Walking;Standing    Patient Stated Goals Reduce pain, walk 5 miles for walking group, ride bicycle 20 mins    Currently in Pain? No/denies    Pain Score 0-No pain    Pain Location Hip    Pain Orientation Right;Left    Pain Type Surgical pain    Pain Onset More than a  month ago    Pain Frequency Occasional    Aggravating Factors  Endurance with exercises (walking, biking) and WB function    Pain Relieving Factors Exercises including stretching    Effect of Pain on Daily Activities Endurance with standing, walking and working around the house is not as good as Juan Hudson would like    Multiple Pain Sites No                [facility] PT Assessment - 01/04/21 0001       Assessment   Medical Diagnosis Dr. Blackman    Referring Provider (PT) Lt THA Anterior    Onset Date/Surgical Date 09/29/20    Hand Dominance Left      Observation/Other Assessments   Focus on Therapeutic Outcomes (FOTO)  67 (Goal 73, was 51 at evaluation)      AROM   Overall AROM  Deficits    AROM Assessment Site Hip;Lumbar     Right/Left Hip Left;Right    Right Hip Flexion 100    Right Hip External Rotation  27    Right Hip Internal Rotation  7    Left Hip Flexion 100    Left Hip External Rotation  36    Left Hip Internal Rotation  3    Lumbar Extension 5      Strength   Right Hip ABduction 4/5    Left Hip ABduction 4/5      Flexibility   Soft Tissue Assessment /Muscle Length yes    Hamstrings 30 degrees B                           [facility] Adult PT Treatment/Exercise - 01/04/21 0001       Therapeutic Activites    Therapeutic Activities Other Therapeutic Activities    ADL's Comprehensive review of HEP and today's RA      Neuro Re-ed    Neuro Re-ed Details  Tandem balance eyes open 6X 20 seconds and eyes closed 1X 20 seconds      Knee/Hip Exercises: Stretches   Active Hamstring Stretch Both;4 reps;20 seconds    Hip Flexor Stretch Both;4 reps;20 seconds    Piriformis Stretch Both;4 reps;20 seconds   Figure 4   Other Knee/Hip Stretches Gluteal stretch 4X 20 seconds      Knee/Hip Exercises: Standing   Other Standing Knee Exercises Trunk extension AROM 10X 3 seconds    Other Standing Knee Exercises Alternating hip hike in door frame 2 sets of 10 for 3 seconds      Knee/Hip Exercises: Seated   Sit to Sand --      Knee/Hip Exercises: Sidelying   Hip ABduction 2 sets;10 reps;Both    Hip ABduction Limitations 1/4 turn from stomach                    PT Education - 01/04/21 1023     Education Details Reviewed, modified and updated HEP post-RA.  Went over RA findings and talked about future progressions.    Person(s) Educated Patient    Methods Explanation;Demonstration;Tactile cues;Verbal cues;Handout    Comprehension Returned demonstration;Need further instruction;Verbal cues required;Verbalized understanding;Tactile cues required              PT Short Term Goals - 01/04/21 1023       PT SHORT TERM GOAL #1   Title Patient will demonstrate independent use of  home exercise program to maintain progress from in clinic treatments.   3    Status Achieved               PT Long Term Goals - 01/04/21 1023       PT LONG TERM GOAL #1   Title Patient will demonstrate/report pain at worst less than or equal to 2/10 to facilitate minimal limitation in daily activity secondary to pain symptoms.    Baseline Stiffness vs pain    Time 8    Period Weeks    Status Achieved      PT LONG TERM GOAL #2   Title Patient will demonstrate independent use of home exercise program to facilitate ability to maintain/progress functional gains from skilled physical therapy services. 8    Time 8    Period Weeks    Status Achieved      PT LONG TERM GOAL #3   Title Pt. will demonstrate FOTO outcome > or = 73 to indicated reduced disability.    Baseline 67 (was 51 at evaluation)    Time 8    Period Weeks    Status On-going      PT LONG TERM GOAL #4   Title Pt. will demonstrate Lt hip MMT 4+ or greater, knee ext Lt 5/5 , dynamometry with 15% of Rt to facilitate stabilty in ambulation.    Baseline Getting better but needs more work    Time 8    Period Weeks    Status On-going      PT LONG TERM GOAL #5   Title Pt. will demonstrate bilateral SLS > 15 seconds to facilitate stability in ambulation at PLOF.    Status On-going      PT LONG TERM GOAL #6   Title Pt. will indicate ability to walk and bike at PLOF s limitation.    Baseline Getting better but strength and endurance need work/progressions    Time 8    Status On-going                   Plan - 01/04/21 1025     Clinical Impression Statement Reassessment today shows progress in hip and trunk AROM, pain and self-reported function.  Ridge' primary complaint is endurance and strength after 1.5 miles of his normal 4-5 mile walk.  He also notes "stumbling" with fatigue, most likely just muscle fatigue.  His HEP was updated and progressed today.  Alexey will benefit from another 4-6 visits to progress  strength, endurance and self-reported function before transfer into independent rehabilitation.  His prognosis remains very good to meet all LTGs.    Personal Factors and Comorbidities Comorbidity 3+    Comorbidities HTN, Hyperlipidemia, history of skin cancer, history  L1 fx. s/p ALIF at L2-3 and L3-4 and PLIF at L2-L4    Examination-Activity Limitations Squat;Sit;Stairs;Stand;Locomotion Level;Transfers    Examination-Participation Restrictions Community Activity;Shop;Yard Work;Other    Stability/Clinical Decision Making Stable/Uncomplicated    Rehab Potential Good    PT Frequency Other (comment)   1-2x/week   PT Duration 8 weeks    PT Treatment/Interventions ADLs/Self Care Home Management;Cryotherapy;Electrical Stimulation;Iontophoresis 64m/ml Dexamethasone;Moist Heat;Balance training;Therapeutic exercise;Therapeutic activities;Functional mobility training;Stair training;Gait training;DME Instruction;Ultrasound;Neuromuscular re-education;Patient/family education;Dry needling;Joint Manipulations;Passive range of motion;Taping;Manual techniques    PT Next Visit Plan Progress low back and LE strength (hip abductors), balance and proprioception along with functional (step) progressions    PT Home Exercise Plan [email or web address]    Consulted and Agree with Plan of Care Patient             Patient will  benefit from skilled therapeutic intervention in order to improve the following deficits and impairments:  Abnormal gait, Decreased endurance, Hypomobility, Pain, Decreased strength, Decreased activity tolerance, Decreased balance, Decreased mobility, Difficulty walking, Improper body mechanics, Impaired perceived functional ability, Impaired flexibility, Decreased coordination, Decreased range of motion  Visit Diagnosis: Difficulty in walking, not elsewhere classified  Muscle weakness (generalized)  Stiffness of left hip, not elsewhere classified  Stiffness of right hip, not elsewhere  classified     Problem List Patient Active Problem List   Diagnosis Date Noted   AMS (altered mental status) 10/03/2020   Status post total replacement of right hip 07/08/2020   Status post total replacement of left hip 07/07/2020   Unilateral primary osteoarthritis, left hip 05/15/2020   Unilateral primary osteoarthritis, right hip 05/15/2020   Lumbar stenosis with neurogenic claudication 01/11/2020   Subacute bronchitis 02/23/2019   Left lumbar radiculopathy 04/08/2017   Heart murmur, systolic 02/18/2017   Hypersomnia 10/12/2015   Obstructive sleep apnea 02/15/2011   Foot pain 01/16/2011   Metatarsalgia of right foot 11/15/2010   Loss of transverse plantar arch 11/15/2010    Rob W  PT, MPT 01/04/2021, 10:30 AM   PHYSICAL THERAPY DISCHARGE SUMMARY  Visits from Start of Care: 12  Current functional level related to goals / functional outcomes: See note   Remaining deficits: See note   Education / Equipment: HEP   Patient agrees to discharge. Patient goals were partially met. Patient is being discharged due to not returning since the last visit.  Michael Wright, PT, DPT, OCS, ATC 02/19/21  9:37 AM    [facility] [address] [place], [place], [address] Phone: [phone]   Fax:  [phone]  Name: Juan Hudson MRN: [id number] Date of Birth: 08/28/1945    

## 2021-01-08 ENCOUNTER — Other Ambulatory Visit: Payer: Self-pay | Admitting: Orthopedic Surgery

## 2021-01-08 ENCOUNTER — Other Ambulatory Visit: Payer: Self-pay

## 2021-01-08 ENCOUNTER — Ambulatory Visit (HOSPITAL_COMMUNITY)
Admission: RE | Admit: 2021-01-08 | Discharge: 2021-01-08 | Disposition: A | Discharge status: Home / Self Care | Payer: PPO | Source: Ambulatory Visit | Attending: Internal Medicine | Admitting: Internal Medicine

## 2021-01-08 DIAGNOSIS — I10 Essential (primary) hypertension: Secondary | ICD-10-CM | POA: Insufficient documentation

## 2021-01-08 DIAGNOSIS — R7989 Other specified abnormal findings of blood chemistry: Secondary | ICD-10-CM

## 2021-01-08 DIAGNOSIS — I350 Nonrheumatic aortic (valve) stenosis: Secondary | ICD-10-CM | POA: Insufficient documentation

## 2021-01-08 DIAGNOSIS — E785 Hyperlipidemia, unspecified: Secondary | ICD-10-CM | POA: Insufficient documentation

## 2021-01-08 LAB — ECHOCARDIOGRAM COMPLETE
AR max vel: 2.1 cm2
AV Area VTI: 2.23 cm2
AV Area mean vel: 2.09 cm2
AV Mean grad: 7.8 mmHg
AV Peak grad: 15.3 mmHg
Ao pk vel: 1.96 m/s
Area-P 1/2: 2.8 cm2
S' Lateral: 4.6 cm

## 2021-01-12 ENCOUNTER — Encounter: Payer: PPO | Admitting: Rehabilitative and Restorative Service Providers"

## 2021-01-19 ENCOUNTER — Encounter: Payer: PPO | Admitting: Rehabilitative and Restorative Service Providers"

## 2021-01-26 ENCOUNTER — Encounter: Payer: PPO | Admitting: Rehabilitative and Restorative Service Providers"

## 2021-02-05 DIAGNOSIS — M79605 Pain in left leg: Secondary | ICD-10-CM | POA: Diagnosis not present

## 2021-02-09 ENCOUNTER — Encounter: Payer: PPO | Admitting: Rehabilitative and Restorative Service Providers"

## 2021-02-20 DIAGNOSIS — D485 Neoplasm of uncertain behavior of skin: Secondary | ICD-10-CM | POA: Diagnosis not present

## 2021-02-20 DIAGNOSIS — D044 Carcinoma in situ of skin of scalp and neck: Secondary | ICD-10-CM | POA: Diagnosis not present

## 2021-03-23 DIAGNOSIS — D044 Carcinoma in situ of skin of scalp and neck: Secondary | ICD-10-CM | POA: Diagnosis not present

## 2021-03-23 DIAGNOSIS — L570 Actinic keratosis: Secondary | ICD-10-CM | POA: Diagnosis not present

## 2021-05-16 DIAGNOSIS — L570 Actinic keratosis: Secondary | ICD-10-CM | POA: Diagnosis not present

## 2021-05-16 DIAGNOSIS — L821 Other seborrheic keratosis: Secondary | ICD-10-CM | POA: Diagnosis not present

## 2021-05-16 DIAGNOSIS — Z23 Encounter for immunization: Secondary | ICD-10-CM | POA: Diagnosis not present

## 2021-05-16 DIAGNOSIS — Z411 Encounter for cosmetic surgery: Secondary | ICD-10-CM | POA: Diagnosis not present

## 2021-05-16 DIAGNOSIS — L578 Other skin changes due to chronic exposure to nonionizing radiation: Secondary | ICD-10-CM | POA: Diagnosis not present

## 2021-05-16 DIAGNOSIS — D225 Melanocytic nevi of trunk: Secondary | ICD-10-CM | POA: Diagnosis not present

## 2021-05-16 DIAGNOSIS — L719 Rosacea, unspecified: Secondary | ICD-10-CM | POA: Diagnosis not present

## 2021-06-07 ENCOUNTER — Ambulatory Visit: Payer: PPO | Admitting: Orthopaedic Surgery

## 2021-06-07 ENCOUNTER — Other Ambulatory Visit: Payer: Self-pay

## 2021-06-07 ENCOUNTER — Ambulatory Visit: Payer: Self-pay

## 2021-06-07 ENCOUNTER — Encounter: Payer: Self-pay | Admitting: Orthopaedic Surgery

## 2021-06-07 DIAGNOSIS — Z96642 Presence of left artificial hip joint: Secondary | ICD-10-CM

## 2021-06-07 DIAGNOSIS — Z96641 Presence of right artificial hip joint: Secondary | ICD-10-CM

## 2021-06-07 NOTE — Progress Notes (Signed)
The patient is a 75 year old gentleman who is being seen in follow-up after having both of his hips replaced.  We replaced one hip in December 2021 and the second hip in March of this year.  He is walking without assistive device.  He reports that he still has somewhat of a shuffling of his gait favoring the right side little bit more than the left side.  The right side was the one that was replaced first.  He is walking without assistive device and does look better to me and his gait then what he was preop with having significant arthritis in both of his hips.  He has had no changes in medical status.  Overall he is pleased with the hips in terms of what he is done to treat the pain from his arthritis.  On exam both hips move smoothly and fluidly.  His leg lengths are equal.  When I do watch him ambulate he just slightly shelf with his gait and I feel that he has some balance and coordination issues.  I have recommended outpatient physical therapy for him to work on his balance and coordination and strengthening of his bilateral lower extremities.  We will see about setting this up at [facility].  I would like to see him back after course of physical therapy in about 2 months but no x-rays are needed.  All questions and concerns were answered and addressed.

## 2021-06-11 DIAGNOSIS — I10 Essential (primary) hypertension: Secondary | ICD-10-CM | POA: Diagnosis not present

## 2021-06-11 DIAGNOSIS — E291 Testicular hypofunction: Secondary | ICD-10-CM | POA: Diagnosis not present

## 2021-06-11 DIAGNOSIS — E785 Hyperlipidemia, unspecified: Secondary | ICD-10-CM | POA: Diagnosis not present

## 2021-06-11 DIAGNOSIS — Z1212 Encounter for screening for malignant neoplasm of rectum: Secondary | ICD-10-CM | POA: Diagnosis not present

## 2021-06-11 DIAGNOSIS — M79605 Pain in left leg: Secondary | ICD-10-CM | POA: Diagnosis not present

## 2021-06-20 ENCOUNTER — Encounter (HOSPITAL_BASED_OUTPATIENT_CLINIC_OR_DEPARTMENT_OTHER): Payer: Self-pay | Admitting: Physical Therapy

## 2021-06-20 ENCOUNTER — Ambulatory Visit (HOSPITAL_BASED_OUTPATIENT_CLINIC_OR_DEPARTMENT_OTHER): Payer: PPO | Attending: Orthopaedic Surgery | Admitting: Physical Therapy

## 2021-06-20 ENCOUNTER — Other Ambulatory Visit: Payer: Self-pay

## 2021-06-20 DIAGNOSIS — Z96642 Presence of left artificial hip joint: Secondary | ICD-10-CM | POA: Diagnosis not present

## 2021-06-20 DIAGNOSIS — M25652 Stiffness of left hip, not elsewhere classified: Secondary | ICD-10-CM | POA: Diagnosis not present

## 2021-06-20 DIAGNOSIS — R262 Difficulty in walking, not elsewhere classified: Secondary | ICD-10-CM | POA: Insufficient documentation

## 2021-06-20 DIAGNOSIS — M6281 Muscle weakness (generalized): Secondary | ICD-10-CM | POA: Diagnosis not present

## 2021-06-20 DIAGNOSIS — M25651 Stiffness of right hip, not elsewhere classified: Secondary | ICD-10-CM | POA: Insufficient documentation

## 2021-06-20 DIAGNOSIS — Z96641 Presence of right artificial hip joint: Secondary | ICD-10-CM | POA: Insufficient documentation

## 2021-06-20 NOTE — Therapy (Signed)
OUTPATIENT PHYSICAL THERAPY LOWER EXTREMITY EVALUATION   Patient Name: Juan Hudson MRN: [id number] DOB:July 10, 1946, 75 y.o., male Today's Date: 06/20/2021   PT End of Session - 06/20/21 1902     Visit Number 1    Number of Visits 12    Date for PT Re-Evaluation 08/01/21    Authorization Type HTA    PT Start Time 1345    PT Stop Time 1426    PT Time Calculation (min) 41 min    Activity Tolerance Patient tolerated treatment well    Behavior During Therapy WFL for tasks assessed/performed             Past Medical History:  Diagnosis Date   Allergy    Arthritis    Cancer ([place])    skin cancer   Cataract    early   History of kidney stones    HLD (hyperlipidemia)    Hypertension    PONV (postoperative nausea and vomiting)    Rosacea    Sleep apnea    cpap   Systolic murmur    Past Surgical History:  Procedure Laterality Date   ABDOMINAL HERNIA REPAIR     x2   ANTERIOR LAT LUMBAR FUSION N/A 01/11/2020   Procedure: Lumbar Two-three Lumbar Three-Four Anterolateral decompression/fusion;  Surgeon: Kristeen Miss, MD;  Location: [facility];  Service: Neurosurgery;  Laterality: N/A;  anterolateral   APPLICATION OF ROBOTIC ASSISTANCE FOR SPINAL PROCEDURE N/A 01/11/2020   Procedure: APPLICATION OF ROBOTIC ASSISTANCE FOR SPINAL PROCEDURE;  Surgeon: Kristeen Miss, MD;  Location: [facility];  Service: Neurosurgery;  Laterality: N/A;  posterior   COLONOSCOPY  04-01-2003   tics and hems   HEMORRHOID SURGERY     INGUINAL HERNIA REPAIR     LUMBAR PERCUTANEOUS PEDICLE SCREW 2 LEVEL N/A 01/11/2020   Procedure: Percutaneous pedicle screw fixation from Lumbar Two to Lumbar Four;  Surgeon: Kristeen Miss, MD;  Location: [facility];  Service: Neurosurgery;  Laterality: N/A;  posterior   NOSE SURGERY     TONSILLECTOMY     TOTAL HIP ARTHROPLASTY Right 07/07/2020   Procedure: RIGHT TOTAL HIP ARTHROPLASTY ANTERIOR APPROACH;  Surgeon: Mcarthur Rossetti, MD;  Location: [facility];  Service: Orthopedics;   Laterality: Right;   TOTAL HIP ARTHROPLASTY Left 09/29/2020   Procedure: LEFT TOTAL HIP ARTHROPLASTY ANTERIOR APPROACH;  Surgeon: Mcarthur Rossetti, MD;  Location: [facility];  Service: Orthopedics;  Laterality: Left;   uvuloplasty     Patient Active Problem List   Diagnosis Date Noted   AMS (altered mental status) 10/03/2020   Status post total replacement of right hip 07/08/2020   Status post total replacement of left hip 07/07/2020   Unilateral primary osteoarthritis, left hip 05/15/2020   Unilateral primary osteoarthritis, right hip 05/15/2020   Lumbar stenosis with neurogenic claudication 01/11/2020   Subacute bronchitis 02/23/2019   Left lumbar radiculopathy 04/08/2017   Heart murmur, systolic 32/35/5732   Hypersomnia 10/12/2015   Obstructive sleep apnea 02/15/2011   Foot pain 01/16/2011   Metatarsalgia of right foot 11/15/2010   Loss of transverse plantar arch 11/15/2010    PCP: Crist Infante, MD  REFERRING PROVIDER: Mcarthur Rossetti  REFERRING DIAG:  K02.542 (ICD-10-CM) - History of left hip replacement  Z96.641 (ICD-10-CM) - History of right hip replacement    THERAPY DIAG:  Difficulty in walking, not elsewhere classified  Muscle weakness (generalized)  Stiffness of left hip, not elsewhere classified  Stiffness of right hip, not elsewhere classified  ONSET DATE: March left hip  replacement  December 2021 right hip replacement   SUBJECTIVE:   SUBJECTIVE STATEMENT: Patient had initial hip replacement in December of 2021 on the right. He had the left one replaced in March of 2022. Since that point he has progressed well, but feels fatigue and at times pain in his hips when he walks too far. He is very active.   PERTINENT HISTORY: Right foot pain, Low back pain with subsequent surgery prior to hip replacements   PAIN:  Are you having pain? No VAS scale: 0/10 can have pain  Pain location: low back  Pain orientation: Right and Left  PAIN TYPE:  aching Pain description: intermittent  Aggravating factors: sitting  Relieving factors: standing and stretching    PRECAUTIONS: None  WEIGHT BEARING RESTRICTIONS No  FALLS:  Has patient fallen in last 6 months? No, Number of falls:   LIVING ENVIRONMENT: Lives with: lives with their family Has steps into the house but no problems at this time  OCCUPATION: Retired   Recreational activity: [place] riding but has not done much lately   PLOF: [place]   Improve endurance walking    OBJECTIVE:   DIAGNOSTIC FINDINGS:  PATIENT SURVEYS:    COGNITION:  Overall cognitive status: Within functional limits for tasks assessed     SENSATION:  Light touch: Appears intact  Stereognosis: Appears intact  Hot/Cold: Appears intact  Proprioception: Appears intact  POSTURE:  Good   LE AROM/PROM: Full active and passive ROM  LE MMT:  MMT Right 06/20/2021 Left 06/20/2021  Hip flexion 13.4 13.4   Hip extension    Hip abduction 20.1 16.6  Hip adduction    Hip internal rotation    Hip external rotation    Knee flexion    Knee extension 31.5 22.5  Ankle dorsiflexion    Ankle plantarflexion    Ankle inversion    Ankle eversion     (Blank rows = not tested)   JOINT MOBILITY ASSESSMENT:    FUNCTIONAL TESTS:  Squat; leans to the right with his squat  Single leg stance: bilateral deficits but L>R    GAIT: Lateral lean to the right   TODAY'S TREATMENT:   Exercises Supine Bridge with Resistance Band - 1 x daily - 7 x weekly - 3 sets - 10 reps Supine March - 1 x daily - 7 x weekly - 3 sets - 10 reps Hooklying Clamshell with Resistance - 1 x daily - 7 x weekly - 3 sets - 10 reps Standing Tandem Balance with Counter Support - 1 x daily - 7 x weekly - 3 sets - 3 reps - 20 hold   PATIENT EDUCATION:  Education details: reviewed potential deficits that can effect gait; reviewed benefits of balance exercises  Person educated: Patient Education method:  Explanation, Demonstration, Tactile cues, Verbal cues, and Handouts Education comprehension: verbalized understanding and returned demonstration   HOME EXERCISE PROGRAM: Access Code: [email or web address] URL: [email or web address] Date: 06/20/2021 Prepared by: Carolyne Littles  Exercises Supine Bridge with Resistance Band - 1 x daily - 7 x weekly - 3 sets - 10 reps Supine March - 1 x daily - 7 x weekly - 3 sets - 10 reps Hooklying Clamshell with Resistance - 1 x daily - 7 x weekly - 3 sets - 10 reps Standing Tandem Balance with Counter Support - 1 x daily - 7 x weekly - 3 sets - 3 reps - 20 hold   ASSESSMENT:  CLINICAL IMPRESSION: Patient is a 75 year old  male S/P bilateral hip replacement the last being the left in March. He had rehab but feels like his endurance is still limited and at times he shuffles his feet. He has back pain when he sits for some time. He has weakness in his bilateral hips L>R. When he squats he favors his right side. He has a slight lateral lean to the right with ambulation. He has decreased single leg stance bilateral L> R. He would like to be able to finish his walk without fatigue and decrease his pain when he walks too far. He would benefit from skilled therapy to improve his endurance with ambulation.   Objective impairments include Abnormal gait, decreased activity tolerance, decreased endurance, decreased mobility, difficulty walking, decreased strength, and pain. These impairments are limiting patient from  decreased ability to ambulate for exercise  . Personal factors including 1 comorbidity: right foot pain   are also affecting patient's functional outcome. Patient will benefit from skilled PT to address above impairments and improve overall function.  REHAB POTENTIAL: Good  CLINICAL DECISION MAKING: Stable/uncomplicated  EVALUATION COMPLEXITY: Low   GOALS: Goals reviewed with patient? Yes  SHORT TERM GOALS:  STG Name Target Date Goal status  1  Patient will increase bilateral single leg stance to 10 seconds without UE support  Baseline:  07/11/2021 INITIAL  2 Patient will increase gross bilateral hip flexion and abduction strength by 10lbs peak force  Baseline:  07/11/2021 INITIAL  3 Patient will be independent with base exercise program to improve strength and motion Baseline: 07/11/2021 INITIAL  LONG TERM GOALS:   LTG Name Target Date Goal status  1 Patient will ambulate 5 miles without pain in order to maintain general health  Baseline: 08/01/2021 INITIAL  2 Patient will report no pain in lower back hen sitting for longer then and hour  Baseline: 08/01/2021 INITIAL  PLAN: PT FREQUENCY: 2x/week  PT DURATION: 6 weeks  PLANNED INTERVENTIONS: Therapeutic exercises, Therapeutic activity, Neuro Muscular re-education, Balance training, Gait training, Patient/Family education, Cryotherapy, and Moist heat  PLAN FOR NEXT SESSION: add lateral band walk and monster walk; consider balance training; advance tandem stance to tandem with eyes closed; consider hurdle training; consider leg press;    Carney Living PT DPT  06/20/2021, 7:06 PM

## 2021-06-21 DIAGNOSIS — I2510 Atherosclerotic heart disease of native coronary artery without angina pectoris: Secondary | ICD-10-CM | POA: Diagnosis not present

## 2021-06-21 DIAGNOSIS — M5416 Radiculopathy, lumbar region: Secondary | ICD-10-CM | POA: Diagnosis not present

## 2021-06-21 DIAGNOSIS — E785 Hyperlipidemia, unspecified: Secondary | ICD-10-CM | POA: Diagnosis not present

## 2021-06-21 DIAGNOSIS — E291 Testicular hypofunction: Secondary | ICD-10-CM | POA: Diagnosis not present

## 2021-06-21 DIAGNOSIS — I10 Essential (primary) hypertension: Secondary | ICD-10-CM | POA: Diagnosis not present

## 2021-06-21 DIAGNOSIS — I350 Nonrheumatic aortic (valve) stenosis: Secondary | ICD-10-CM | POA: Diagnosis not present

## 2021-06-21 DIAGNOSIS — R413 Other amnesia: Secondary | ICD-10-CM | POA: Diagnosis not present

## 2021-06-21 DIAGNOSIS — G4733 Obstructive sleep apnea (adult) (pediatric): Secondary | ICD-10-CM | POA: Diagnosis not present

## 2021-06-21 DIAGNOSIS — Z23 Encounter for immunization: Secondary | ICD-10-CM | POA: Diagnosis not present

## 2021-06-21 DIAGNOSIS — I351 Nonrheumatic aortic (valve) insufficiency: Secondary | ICD-10-CM | POA: Diagnosis not present

## 2021-06-28 ENCOUNTER — Other Ambulatory Visit: Payer: Self-pay

## 2021-06-28 ENCOUNTER — Ambulatory Visit (HOSPITAL_BASED_OUTPATIENT_CLINIC_OR_DEPARTMENT_OTHER): Payer: PPO | Attending: Orthopaedic Surgery | Admitting: Physical Therapy

## 2021-06-28 DIAGNOSIS — M25651 Stiffness of right hip, not elsewhere classified: Secondary | ICD-10-CM | POA: Diagnosis not present

## 2021-06-28 DIAGNOSIS — M6281 Muscle weakness (generalized): Secondary | ICD-10-CM | POA: Insufficient documentation

## 2021-06-28 DIAGNOSIS — M25652 Stiffness of left hip, not elsewhere classified: Secondary | ICD-10-CM | POA: Diagnosis not present

## 2021-06-28 DIAGNOSIS — R262 Difficulty in walking, not elsewhere classified: Secondary | ICD-10-CM | POA: Insufficient documentation

## 2021-06-28 NOTE — Therapy (Signed)
OUTPATIENT PHYSICAL THERAPY TREATMENT NOTE   Patient Name: Juan Hudson MRN: [id number] DOB:1946-03-06, 75 y.o., male Today's Date: 06/29/2021  PCP: Crist Infante, MD REFERRING PROVIDER: Crist Infante, MD   PT End of Session - 06/29/21 0840     Visit Number 2    Number of Visits 12    Date for PT Re-Evaluation 08/01/21    Authorization Type HTA    PT Start Time 9563    PT Stop Time 1228    PT Time Calculation (min) 43 min    Activity Tolerance Patient tolerated treatment well    Behavior During Therapy WFL for tasks assessed/performed             Past Medical History:  Diagnosis Date   Allergy    Arthritis    Cancer ([place])    skin cancer   Cataract    early   History of kidney stones    HLD (hyperlipidemia)    Hypertension    PONV (postoperative nausea and vomiting)    Rosacea    Sleep apnea    cpap   Systolic murmur    Past Surgical History:  Procedure Laterality Date   ABDOMINAL HERNIA REPAIR     x2   ANTERIOR LAT LUMBAR FUSION N/A 01/11/2020   Procedure: Lumbar Two-three Lumbar Three-Four Anterolateral decompression/fusion;  Surgeon: Kristeen Miss, MD;  Location: [facility];  Service: Neurosurgery;  Laterality: N/A;  anterolateral   APPLICATION OF ROBOTIC ASSISTANCE FOR SPINAL PROCEDURE N/A 01/11/2020   Procedure: APPLICATION OF ROBOTIC ASSISTANCE FOR SPINAL PROCEDURE;  Surgeon: Kristeen Miss, MD;  Location: [facility];  Service: Neurosurgery;  Laterality: N/A;  posterior   COLONOSCOPY  04-01-2003   tics and hems   HEMORRHOID SURGERY     INGUINAL HERNIA REPAIR     LUMBAR PERCUTANEOUS PEDICLE SCREW 2 LEVEL N/A 01/11/2020   Procedure: Percutaneous pedicle screw fixation from Lumbar Two to Lumbar Four;  Surgeon: Kristeen Miss, MD;  Location: [facility];  Service: Neurosurgery;  Laterality: N/A;  posterior   NOSE SURGERY     TONSILLECTOMY     TOTAL HIP ARTHROPLASTY Right 07/07/2020   Procedure: RIGHT TOTAL HIP ARTHROPLASTY ANTERIOR APPROACH;  Surgeon: Mcarthur Rossetti, MD;  [facility];  Service: Orthopedics;  Laterality: Right;   TOTAL HIP ARTHROPLASTY Left 09/29/2020   Procedure: LEFT TOTAL HIP ARTHROPLASTY ANTERIOR APPROACH;  Surgeon: Mcarthur Rossetti, MD;  [facility];  Service: Orthopedics;  Laterality: Left;   uvuloplasty     Patient Active Problem List   Diagnosis Date Noted   AMS (altered mental status) 10/03/2020   Status post total replacement of right hip 07/08/2020   Status post total replacement of left hip 07/07/2020   Unilateral primary osteoarthritis, left hip 05/15/2020   Unilateral primary osteoarthritis, right hip 05/15/2020   Lumbar stenosis with neurogenic claudication 01/11/2020   Subacute bronchitis 02/23/2019   Left lumbar radiculopathy 04/08/2017   Heart murmur, systolic 87/56/4332   Hypersomnia 10/12/2015   Obstructive sleep apnea 02/15/2011   Foot pain 01/16/2011   Metatarsalgia of right foot 11/15/2010   Loss of transverse plantar arch 11/15/2010   PCP: Crist Infante, MD   REFERRING PROVIDER: Mcarthur Rossetti   REFERRING DIAG:  R51.884 (ICD-10-CM) - History of left hip replacement  Z96.641 (ICD-10-CM) - History of right hip replacement      THERAPY DIAG:  Difficulty in walking, not elsewhere classified   Muscle weakness (generalized)   Stiffness of left hip, not elsewhere classified  Stiffness of right hip, not elsewhere classified   ONSET DATE: March left hip replacement  December 2021 right hip replacement    SUBJECTIVE:    SUBJECTIVE STATEMENT: Patient is doing well. He has been working on the exercises without significant pain. He is not having pain today.   PERTINENT HISTORY: Right foot pain, Low back pain with subsequent surgery prior to hip replacements    PAIN:  Are you having pain? No VAS scale: 0/10 can have pain  Pain location: low back  Pain orientation: Right and Left  PAIN TYPE: aching Pain description: intermittent  Aggravating factors: sitting  Relieving  factors: standing and stretching     PRECAUTIONS: None   WEIGHT BEARING RESTRICTIONS No   FALLS:  Has patient fallen in last 6 months? No, Number of falls:    LIVING ENVIRONMENT: Lives with: lives with their family Has steps into the house but no problems at this time  OCCUPATION: Retired    Recreational activity: Bike riding but has not done much lately    PLOF: [place]    Improve endurance walking      OBJECTIVE:     TODAY'S TREATMENT: 06/28/2021  Reviewed when and how to use stretches LTR x20 Piriformis x20   Supine exercises\ Bridge witrh green band 2x15 Supine march x10 and x10 with switch progression  Standing lateral band walk 3x10 green  Monster walk 3x10 green   Single leg stance 3x10 each leg. More stability noted on the right   Updated HEP and reviewed normal response to exercises.           Eval: Exercises Supine Bridge with Resistance Band - 1 x daily - 7 x weekly - 3 sets - 10 reps Supine March - 1 x daily - 7 x weekly - 3 sets - 10 reps Hooklying Clamshell with Resistance - 1 x daily - 7 x weekly - 3 sets - 10 reps Standing Tandem Balance with Counter Support - 1 x daily - 7 x weekly - 3 sets - 3 reps - 20 hold     PATIENT EDUCATION:  Education details: reviewed potential deficits that can effect gait; reviewed benefits of balance exercises  Person educated: Patient Education method: Explanation, Demonstration, Tactile cues, Verbal cues, and Handouts Education comprehension: verbalized understanding and returned demonstration     HOME EXERCISE PROGRAM: Access Code: [email or web address] URL: [email or web address] Date: 06/20/2021 Prepared by: Carolyne Littles   Exercises Supine Bridge with Resistance Band - 1 x daily - 7 x weekly - 3 sets - 10 reps Supine March - 1 x daily - 7 x weekly - 3 sets - 10 reps Hooklying Clamshell with Resistance - 1 x daily - 7 x weekly - 3 sets - 10 reps Standing Tandem Balance with  Counter Support - 1 x daily - 7 x weekly - 3 sets - 3 reps - 20 hold     ASSESSMENT:   CLINICAL IMPRESSION: Patient tolerated treatment well. He had no increase in pain> patient was advised st home to pick his 5 most difficult exercises and supplement with walking as well.  We will assess his tolerance to new exercises next visit. His goal remains to be able to walk 5 miles comfortably. Therapy reviewed how these specific exercises can help him reach that goal. He showed mild instability on the left comapred to right with single leg stance. We will continue to monitor.   Objective impairments include Abnormal gait, decreased activity tolerance, decreased endurance,  decreased mobility, difficulty walking, decreased strength, and pain. These impairments are limiting patient from  decreased ability to ambulate for exercise  . Personal factors including 1 comorbidity: right foot pain   are also affecting patient's functional outcome. Patient will benefit from skilled PT to address above impairments and improve overall function.   REHAB POTENTIAL: Good   CLINICAL DECISION MAKING: Stable/uncomplicated   EVALUATION COMPLEXITY: Low     GOALS: Goals reviewed with patient? Yes   SHORT TERM GOALS:   STG Name Target Date Goal status  1 Patient will increase bilateral single leg stance to 10 seconds without UE support  Baseline:  07/11/2021 INITIAL  2 Patient will increase gross bilateral hip flexion and abduction strength by 10lbs peak force  Baseline:  07/11/2021 INITIAL  3 Patient will be independent with base exercise program to improve strength and motion Baseline: 07/11/2021 INITIAL  LONG TERM GOALS:    LTG Name Target Date Goal status  1 Patient will ambulate 5 miles without pain in order to maintain general health  Baseline: 08/01/2021 INITIAL  2 Patient will report no pain in lower back hen sitting for longer then and hour  Baseline: 08/01/2021 INITIAL  PLAN: PT FREQUENCY: 2x/week    PT DURATION: 6 weeks   PLANNED INTERVENTIONS: Therapeutic exercises, Therapeutic activity, Neuro Muscular re-education, Balance training, Gait training, Patient/Family education, Cryotherapy, and Moist heat   PLAN FOR NEXT SESSION: add lateral band walk and monster walk; consider balance training; advance tandem stance to tandem with eyes closed; consider hurdle training; consider leg press;       Carney Living PT DPT  06/29/2021, 8:41 AM

## 2021-06-29 ENCOUNTER — Encounter (HOSPITAL_BASED_OUTPATIENT_CLINIC_OR_DEPARTMENT_OTHER): Payer: Self-pay | Admitting: Physical Therapy

## 2021-07-05 ENCOUNTER — Other Ambulatory Visit: Payer: Self-pay

## 2021-07-05 ENCOUNTER — Ambulatory Visit (HOSPITAL_BASED_OUTPATIENT_CLINIC_OR_DEPARTMENT_OTHER): Payer: PPO | Admitting: Physical Therapy

## 2021-07-05 ENCOUNTER — Encounter (HOSPITAL_BASED_OUTPATIENT_CLINIC_OR_DEPARTMENT_OTHER): Payer: Self-pay | Admitting: Physical Therapy

## 2021-07-05 DIAGNOSIS — M25652 Stiffness of left hip, not elsewhere classified: Secondary | ICD-10-CM

## 2021-07-05 DIAGNOSIS — M6281 Muscle weakness (generalized): Secondary | ICD-10-CM

## 2021-07-05 DIAGNOSIS — M25651 Stiffness of right hip, not elsewhere classified: Secondary | ICD-10-CM

## 2021-07-05 DIAGNOSIS — R262 Difficulty in walking, not elsewhere classified: Secondary | ICD-10-CM | POA: Diagnosis not present

## 2021-07-05 NOTE — Therapy (Signed)
OUTPATIENT PHYSICAL THERAPY TREATMENT NOTE   Patient Name: Juan Hudson MRN: [id number] DOB:Oct 14, 1945, 75 y.o., male Today's Date: 07/05/2021  PCP: Crist Infante, MD REFERRING PROVIDER: Crist Infante, MD    Past Medical History:  Diagnosis Date   Allergy    Arthritis    Cancer ([place])    skin cancer   Cataract    early   History of kidney stones    HLD (hyperlipidemia)    Hypertension    PONV (postoperative nausea and vomiting)    Rosacea    Sleep apnea    cpap   Systolic murmur    Past Surgical History:  Procedure Laterality Date   ABDOMINAL HERNIA REPAIR     x2   ANTERIOR LAT LUMBAR FUSION N/A 01/11/2020   Procedure: Lumbar Two-three Lumbar Three-Four Anterolateral decompression/fusion;  Surgeon: Kristeen Miss, MD;  Location: [place];  Service: Neurosurgery;  Laterality: N/A;  anterolateral   APPLICATION OF ROBOTIC ASSISTANCE FOR SPINAL PROCEDURE N/A 01/11/2020   Procedure: APPLICATION OF ROBOTIC ASSISTANCE FOR SPINAL PROCEDURE;  Surgeon: Kristeen Miss, MD;  Location: [place];  Service: Neurosurgery;  Laterality: N/A;  posterior   COLONOSCOPY  04-01-2003   tics and hems   HEMORRHOID SURGERY     INGUINAL HERNIA REPAIR     LUMBAR PERCUTANEOUS PEDICLE SCREW 2 LEVEL N/A 01/11/2020   Procedure: Percutaneous pedicle screw fixation from Lumbar Two to Lumbar Four;  Surgeon: Kristeen Miss, MD;  Location: [place];  Service: Neurosurgery;  Laterality: N/A;  posterior   NOSE SURGERY     TONSILLECTOMY     TOTAL HIP ARTHROPLASTY Right 07/07/2020   Procedure: RIGHT TOTAL HIP ARTHROPLASTY ANTERIOR APPROACH;  Surgeon: Mcarthur Rossetti, MD;  Location[facility];  Service: Orthopedics;  Laterality: Right;   TOTAL HIP ARTHROPLASTY Left 09/29/2020   Procedure: LEFT TOTAL HIP ARTHROPLASTY ANTERIOR APPROACH;  Surgeon: Mcarthur Rossetti, MD;  Location: [facility];  Service: Orthopedics;  Laterality: Left;   uvuloplasty     Patient Active Problem List   Diagnosis Date Noted   AMS (altered  mental status) 10/03/2020   Status post total replacement of right hip 07/08/2020   Status post total replacement of left hip 07/07/2020   Unilateral primary osteoarthritis, left hip 05/15/2020   Unilateral primary osteoarthritis, right hip 05/15/2020   Lumbar stenosis with neurogenic claudication 01/11/2020   Subacute bronchitis 02/23/2019   Left lumbar radiculopathy 04/08/2017   Heart murmur, systolic 32/20/2542   Hypersomnia 10/12/2015   Obstructive sleep apnea 02/15/2011   Foot pain 01/16/2011   Metatarsalgia of right foot 11/15/2010   Loss of transverse plantar arch 11/15/2010    PCP: Crist Infante, MD   REFERRING PROVIDER: Mcarthur Rossetti   REFERRING DIAG:  H06.237 (ICD-10-CM) - History of left hip replacement  [phone] (ICD-10-CM) - History of right hip replacement      THERAPY DIAG:  Difficulty in walking, not elsewhere classified   Muscle weakness (generalized)   Stiffness of left hip, not elsewhere classified   Stiffness of right hip, not elsewhere classified   ONSET DATE: March left hip replacement  December 2021 right hip replacement    SUBJECTIVE:    SUBJECTIVE STATEMENT: Patient is doing well. He has been working on the exercises without significant pain. He is not having pain today.   PERTINENT HISTORY: Right foot pain, Low back pain with subsequent surgery prior to hip replacements    PAIN:  Are you having pain? No VAS scale: 0/10 can have pain  Pain  location: low back  Pain orientation: Right and Left  PAIN TYPE: aching Pain description: intermittent  Aggravating factors: sitting  Relieving factors: standing and stretching     PRECAUTIONS: None   WEIGHT BEARING RESTRICTIONS No   FALLS:  Has patient fallen in last 6 months? No, Number of falls:    LIVING ENVIRONMENT: Lives with: lives with their family Has steps into the house but no problems at this time  OCCUPATION: Retired    Recreational activity: Bike riding but has not done  much lately    PLOF: [place] endurance walking      OBJECTIVE:     TODAY'S TREATMENT: 12/15 Ex bike 5 min. Felt like right leg was collapsing in  Bridge witrh green band 2x15 SLR 2x15 each leg     Standing lateral band walk 3x10 green  Monster walk 3x10 green   Narrow base of support 2x30 sec no assist  Narrow base eyes closed 3x30 sec SBA  Tandem stance eyes open 2x30 sec each leg more difficulty with left leg forward.     06/28/2021  Reviewed when and how to use stretches LTR x20 Piriformis x20    Supine exercises\ Bridge witrh green band 2x15 Supine march x10 and x10 with switch progression   Standing lateral band walk 3x10 green  Monster walk 3x10 green    Single leg stance 3x10 each leg. More stability noted on the right    Updated HEP and reviewed normal response to exercises.               Eval: Exercises Supine Bridge with Resistance Band - 1 x daily - 7 x weekly - 3 sets - 10 reps Supine March - 1 x daily - 7 x weekly - 3 sets - 10 reps Hooklying Clamshell with Resistance - 1 x daily - 7 x weekly - 3 sets - 10 reps Standing Tandem Balance with Counter Support - 1 x daily - 7 x weekly - 3 sets - 3 reps - 20 hold     PATIENT EDUCATION:  Education details: reviewed potential deficits that can effect gait; reviewed benefits of balance exercises  Person educated: Patient Education method: Explanation, Demonstration, Tactile cues, Verbal cues, and Handouts Education comprehension: verbalized understanding and returned demonstration     HOME EXERCISE PROGRAM: Access Code: [email or web address] URL: [email or web address] Date: 06/20/2021 Prepared by: Carolyne Littles   Exercises Supine Bridge with Resistance Band - 1 x daily - 7 x weekly - 3 sets - 10 reps Supine March - 1 x daily - 7 x weekly - 3 sets - 10 reps Hooklying Clamshell with Resistance - 1 x daily - 7 x weekly - 3 sets - 10 reps Standing Tandem Balance  with Counter Support - 1 x daily - 7 x weekly - 3 sets - 3 reps - 20 hold     ASSESSMENT:   CLINICAL IMPRESSION: Patient focused more on balance exercises today. We reviewed balance exercises as well as progression. His left leg was clearly more of an issue then the right with balancing. He has been able to walk 5 miles but it was a bit of a struggle. He was advised to keep working on it and it should improve. Ww reviewed the technique with a few of his exercises. Overall he is doing well. We will continue to progress.    Objective impairments include Abnormal gait, decreased activity tolerance, decreased endurance, decreased mobility, difficulty walking, decreased strength,  and pain. These impairments are limiting patient from  decreased ability to ambulate for exercise  . Personal factors including 1 comorbidity: right foot pain   are also affecting patient's functional outcome. Patient will benefit from skilled PT to address above impairments and improve overall function.   REHAB POTENTIAL: Good   CLINICAL DECISION MAKING: Stable/uncomplicated   EVALUATION COMPLEXITY: Low     GOALS: Goals reviewed with patient? Yes   SHORT TERM GOALS:   STG Name Target Date Goal status  1 Patient will increase bilateral single leg stance to 10 seconds without UE support  Baseline:  07/11/2021 INITIAL  2 Patient will increase gross bilateral hip flexion and abduction strength by 10lbs peak force  Baseline:  07/11/2021 INITIAL  3 Patient will be independent with base exercise program to improve strength and motion Baseline: 07/11/2021 INITIAL  LONG TERM GOALS:    LTG Name Target Date Goal status  1 Patient will ambulate 5 miles without pain in order to maintain general health  Baseline: 08/01/2021 INITIAL  2 Patient will report no pain in lower back hen sitting for longer then and hour  Baseline: 08/01/2021 INITIAL  PLAN: PT FREQUENCY: 2x/week   PT DURATION: 6 weeks   PLANNED INTERVENTIONS:  Therapeutic exercises, Therapeutic activity, Neuro Muscular re-education, Balance training, Gait training, Patient/Family education, Cryotherapy, and Moist heat   PLAN FOR NEXT SESSION: add lateral band walk and monster walk; consider balance training; advance tandem stance to tandem with eyes closed; consider hurdle training; consider leg press;       Carney Living PT DPT  07/05/2021, 12:26 PM

## 2021-07-06 ENCOUNTER — Encounter (HOSPITAL_BASED_OUTPATIENT_CLINIC_OR_DEPARTMENT_OTHER): Payer: Self-pay | Admitting: Physical Therapy

## 2021-07-12 ENCOUNTER — Other Ambulatory Visit: Payer: Self-pay

## 2021-07-12 ENCOUNTER — Ambulatory Visit (HOSPITAL_BASED_OUTPATIENT_CLINIC_OR_DEPARTMENT_OTHER): Payer: PPO | Admitting: Physical Therapy

## 2021-07-12 DIAGNOSIS — R262 Difficulty in walking, not elsewhere classified: Secondary | ICD-10-CM | POA: Diagnosis not present

## 2021-07-12 DIAGNOSIS — M6281 Muscle weakness (generalized): Secondary | ICD-10-CM

## 2021-07-12 DIAGNOSIS — M25651 Stiffness of right hip, not elsewhere classified: Secondary | ICD-10-CM

## 2021-07-12 DIAGNOSIS — M25652 Stiffness of left hip, not elsewhere classified: Secondary | ICD-10-CM

## 2021-07-12 NOTE — Therapy (Signed)
OUTPATIENT PHYSICAL THERAPY TREATMENT NOTE   Patient Name: Juan Hudson MRN: [id number] DOB:09/18/1945, 75 y.o., male Today's Date: 07/13/2021  PCP: Crist Infante, MD REFERRING PROVIDER: Crist Infante, MD   PT End of Session - 07/13/21 1208     Visit Number 4    Number of Visits 12    Date for PT Re-Evaluation 08/01/21    Authorization Type HTA    PT Start Time 1145    PT Stop Time 1228    PT Time Calculation (min) 43 min    Activity Tolerance Patient tolerated treatment well    Behavior During Therapy WFL for tasks assessed/performed             Past Medical History:  Diagnosis Date   Allergy    Arthritis    Cancer ([place])    skin cancer   Cataract    early   History of kidney stones    HLD (hyperlipidemia)    Hypertension    PONV (postoperative nausea and vomiting)    Rosacea    Sleep apnea    cpap   Systolic murmur    Past Surgical History:  Procedure Laterality Date   ABDOMINAL HERNIA REPAIR     x2   ANTERIOR LAT LUMBAR FUSION N/A 01/11/2020   Procedure: Lumbar Two-three Lumbar Three-Four Anterolateral decompression/fusion;  Surgeon: Kristeen Miss, MD;  Location: [place];  Service: Neurosurgery;  Laterality: N/A;  anterolateral   APPLICATION OF ROBOTIC ASSISTANCE FOR SPINAL PROCEDURE N/A 01/11/2020   Procedure: APPLICATION OF ROBOTIC ASSISTANCE FOR SPINAL PROCEDURE;  Surgeon: Kristeen Miss, MD;  Location: [place];  Service: Neurosurgery;  Laterality: N/A;  posterior   COLONOSCOPY  04-01-2003   tics and hems   HEMORRHOID SURGERY     INGUINAL HERNIA REPAIR     LUMBAR PERCUTANEOUS PEDICLE SCREW 2 LEVEL N/A 01/11/2020   Procedure: Percutaneous pedicle screw fixation from Lumbar Two to Lumbar Four;  Surgeon: Kristeen Miss, MD;  Location: [place];  Service: Neurosurgery;  Laterality: N/A;  posterior   NOSE SURGERY     TONSILLECTOMY     TOTAL HIP ARTHROPLASTY Right 07/07/2020   Procedure: RIGHT TOTAL HIP ARTHROPLASTY ANTERIOR APPROACH;  Surgeon: Mcarthur Rossetti, MD;  [facility];  Service: Orthopedics;  Laterality: Right;   TOTAL HIP ARTHROPLASTY Left 09/29/2020   Procedure: LEFT TOTAL HIP ARTHROPLASTY ANTERIOR APPROACH;  Surgeon: Mcarthur Rossetti, MD;  [facility];  Service: Orthopedics;  Laterality: Left;   uvuloplasty     Patient Active Problem List   Diagnosis Date Noted   AMS (altered mental status) 10/03/2020   Status post total replacement of right hip 07/08/2020   Status post total replacement of left hip 07/07/2020   Unilateral primary osteoarthritis, left hip 05/15/2020   Unilateral primary osteoarthritis, right hip 05/15/2020   Lumbar stenosis with neurogenic claudication 01/11/2020   Subacute bronchitis 02/23/2019   Left lumbar radiculopathy 04/08/2017   Heart murmur, systolic 01/19/1600   Hypersomnia 10/12/2015   Obstructive sleep apnea 02/15/2011   Foot pain 01/16/2011   Metatarsalgia of right foot 11/15/2010   Loss of transverse plantar arch 11/15/2010  PCP: Crist Infante, MD   REFERRING PROVIDER: Mcarthur Rossetti   REFERRING DIAG:  U93.235 (ICD-10-CM) - History of left hip replacement  Z96.641 (ICD-10-CM) - History of right hip replacement      THERAPY DIAG:  Difficulty in walking, not elsewhere classified   Muscle weakness (generalized)   Stiffness of left hip, not elsewhere classified  Stiffness of right hip, not elsewhere classified   ONSET DATE: March left hip replacement  December 2021 right hip replacement    SUBJECTIVE:    SUBJECTIVE STATEMENT: Patient reports he has had syncope over the past few days. He fell on Monday. He did not hurt himself but he had some trouble standing up.  PERTINENT HISTORY: Right foot pain, Low back pain with subsequent surgery prior to hip replacements    PAIN:  Are you having pain? No VAS scale: 0/10 can have pain  Pain location: low back  Pain orientation: Right and Left  PAIN TYPE: aching Pain description: intermittent  Aggravating  factors: sitting  Relieving factors: standing and stretching     PRECAUTIONS: None   WEIGHT BEARING RESTRICTIONS No   FALLS:  Has patient fallen in last 6 months? No, Number of falls:    LIVING ENVIRONMENT: Lives with: lives with their family Has steps into the house but no problems at this time  OCCUPATION: Retired    Recreational activity: Bike riding but has not done much lately    PLOF: Independent   PATIENT GOALS    Improve endurance walking      OBJECTIVE:     MMT Right 06/20/2021 Left 06/20/2021  Hip flexion 13.4  33.2 13.4  33.6    Hip extension      Hip abduction 20.1  44.1 16.6  40.4  Hip adduction      Hip internal rotation      Hip external rotation      Knee flexion      Knee extension 31.5  53.2 22.5  56.4  Ankle dorsiflexion      Ankle plantarflexion      Ankle inversion      Ankle eversion       (Blank rows = not tested)     TODAY'S TREATMENT: 12/22 Ex bike 5 min. Felt like right leg was collapsing in  Bridge witrh green band 2x15 SLR 2x15 each leg   Nu-step: 5 min      Therapy measured the patients strength today. Reviewed results vs age related norms  Nu-step 5 min.   12/23 12/15 Ex bike 5 min. Felt like right leg was collapsing in  Bridge with green band 2x15 SLR 2x15 each leg   Narrow base of support 2x30 sec no assist  Narrow base eyes closed 3x30 sec SBA  Tandem stance eyes open 2x30 sec each leg more difficulty with left leg forward.  [place] eyes closed 2x30 sec; reviewed how to do at home       Standing lateral band walk 3x10 green  Monster walk 3x10 green    Narrow base of support 2x30 sec no assist  Narrow base eyes closed 3x30 sec SBA  Tandem stance eyes open 2x30 sec each leg more difficulty with left leg forward.        06/28/2021  Reviewed when and how to use stretches LTR x20 Piriformis x20    Supine exercises\ Bridge witrh green band 2x15 Supine march x10 and x10 with switch  progression   Standing lateral band walk 3x10 green  Monster walk 3x10 green    Single leg stance 3x10 each leg. More stability noted on the right    Updated HEP and reviewed normal response to exercises.               Eval: Exercises Supine Bridge with Resistance Band - 1 x daily - 7 x weekly - 3 sets -  10 reps Supine March - 1 x daily - 7 x weekly - 3 sets - 10 reps Hooklying Clamshell with Resistance - 1 x daily - 7 x weekly - 3 sets - 10 reps Standing Tandem Balance with Counter Support - 1 x daily - 7 x weekly - 3 sets - 3 reps - 20 hold     PATIENT EDUCATION:  Education details: reviewed potential deficits that can effect gait; reviewed benefits of balance exercises  Person educated: Patient Education method: Explanation, Demonstration, Tactile cues, Verbal cues, and Handouts Education comprehension: verbalized understanding and returned demonstration     HOME EXERCISE PROGRAM: Access Code: [email or web address] URL: [email or web address] Date: 06/20/2021 Prepared by: Carolyne Littles   Exercises Supine Bridge with Resistance Band - 1 x daily - 7 x weekly - 3 sets - 10 reps Supine March - 1 x daily - 7 x weekly - 3 sets - 10 reps Hooklying Clamshell with Resistance - 1 x daily - 7 x weekly - 3 sets - 10 reps Standing Tandem Balance with Counter Support - 1 x daily - 7 x weekly - 3 sets - 3 reps - 20 hold     ASSESSMENT:   CLINICAL IMPRESSION: Patients strength has improved significantly in all movements. He is still slightly below age specific norms ut still a significant improvement. He showed no signs of syncope during the the treatment.  His B/P was high today. It measured at 148/90. He will monitor at home. Therapy reviewed balance activity with the patient. He tolerated well. He had no syncope with treatment. Therapy will re-assess next visit.   Objective impairments include Abnormal gait, decreased activity tolerance, decreased endurance, decreased mobility,  difficulty walking, decreased strength, and pain. These impairments are limiting patient from  decreased ability to ambulate for exercise  . Personal factors including 1 comorbidity: right foot pain   are also affecting patient's functional outcome. Patient will benefit from skilled PT to address above impairments and improve overall function.   REHAB POTENTIAL: Good   CLINICAL DECISION MAKING: Stable/uncomplicated   EVALUATION COMPLEXITY: Low     GOALS: Goals reviewed with patient? Yes   SHORT TERM GOALS:   STG Name Target Date Goal status  1 Patient will increase bilateral single leg stance to 10 seconds without UE support  Baseline:  07/11/2021 INITIAL  2 Patient will increase gross bilateral hip flexion and abduction strength by 10lbs peak force  Baseline:  07/11/2021 INITIAL  3 Patient will be independent with base exercise program to improve strength and motion Baseline: 07/11/2021 INITIAL  LONG TERM GOALS:    LTG Name Target Date Goal status  1 Patient will ambulate 5 miles without pain in order to maintain general health  Baseline: 08/01/2021 INITIAL  2 Patient will report no pain in lower back hen sitting for longer then and hour  Baseline: 08/01/2021 INITIAL  PLAN: PT FREQUENCY: 2x/week   PT DURATION: 6 weeks   PLANNED INTERVENTIONS: Therapeutic exercises, Therapeutic activity, Neuro Muscular re-education, Balance training, Gait training, Patient/Family education, Cryotherapy, and Moist heat   PLAN FOR NEXT SESSION: add lateral band walk and monster walk; consider balance training; advance tandem stance to tandem with eyes closed; consider hurdle training; consider leg press;       Carney Living PT DPT  07/13/2021, 12:09 PM

## 2021-07-13 ENCOUNTER — Encounter (HOSPITAL_BASED_OUTPATIENT_CLINIC_OR_DEPARTMENT_OTHER): Payer: Self-pay | Admitting: Physical Therapy

## 2021-07-20 ENCOUNTER — Encounter (HOSPITAL_BASED_OUTPATIENT_CLINIC_OR_DEPARTMENT_OTHER): Payer: Self-pay | Admitting: Physical Therapy

## 2021-07-31 DIAGNOSIS — H25013 Cortical age-related cataract, bilateral: Secondary | ICD-10-CM | POA: Diagnosis not present

## 2021-07-31 DIAGNOSIS — H43813 Vitreous degeneration, bilateral: Secondary | ICD-10-CM | POA: Diagnosis not present

## 2021-07-31 DIAGNOSIS — H2513 Age-related nuclear cataract, bilateral: Secondary | ICD-10-CM | POA: Diagnosis not present

## 2021-07-31 DIAGNOSIS — H5203 Hypermetropia, bilateral: Secondary | ICD-10-CM | POA: Diagnosis not present

## 2021-08-07 ENCOUNTER — Ambulatory Visit: Payer: PPO | Admitting: Orthopaedic Surgery

## 2021-08-08 ENCOUNTER — Ambulatory Visit (INDEPENDENT_AMBULATORY_CARE_PROVIDER_SITE_OTHER): Payer: PPO | Admitting: Orthopaedic Surgery

## 2021-08-08 ENCOUNTER — Other Ambulatory Visit: Payer: Self-pay

## 2021-08-08 ENCOUNTER — Encounter: Payer: Self-pay | Admitting: Orthopaedic Surgery

## 2021-08-08 DIAGNOSIS — Z96643 Presence of artificial hip joint, bilateral: Secondary | ICD-10-CM

## 2021-08-08 NOTE — Progress Notes (Signed)
HPI: [name] returns today status post bilateral hip arthroplasties.  He is just over a year on the right total hip arthroplasty in he will be 1 year postop on 09/29/2020 with a left hip.  He did get it therapy.  He states that he begins to shuffle his feet every walk 5 miles and has to remind himself to pick his feet up.  He also states he has some stiffness when he first gets up to ambulate but no pain in either hip.  He is having no low back pain no radicular symptoms down either leg.  He does feel that therapy was somewhat beneficial.  Physical exam: Bilateral hips excellent range of motion without pain.  He ambulates without any assistive device and a nonantalgic gait.  Dorsiflexion plantarflexion bilateral ankles intact.  Good range of motion bilateral knees without pain.  Impression: Status post right total hip arthroplasty 07/07/2020 Status post left total hip arthroplasty 09/29/2020.  Plan: We will continue to work on strength gait balance.  He will follow-up with [place] as needed.  Questions were encouraged and answered by Dr. Ninfa Hudson and myself.  Did discuss with him that he stenotic full year out from a right total hip often takes over a year for her someone to recover fully from having hip surgery.

## 2021-08-09 DIAGNOSIS — H2511 Age-related nuclear cataract, right eye: Secondary | ICD-10-CM | POA: Diagnosis not present

## 2021-08-09 DIAGNOSIS — H25011 Cortical age-related cataract, right eye: Secondary | ICD-10-CM | POA: Diagnosis not present

## 2021-08-09 DIAGNOSIS — H52221 Regular astigmatism, right eye: Secondary | ICD-10-CM | POA: Diagnosis not present

## 2021-08-09 DIAGNOSIS — H25811 Combined forms of age-related cataract, right eye: Secondary | ICD-10-CM | POA: Diagnosis not present

## 2021-08-16 DIAGNOSIS — H2512 Age-related nuclear cataract, left eye: Secondary | ICD-10-CM | POA: Diagnosis not present

## 2021-08-16 DIAGNOSIS — H52222 Regular astigmatism, left eye: Secondary | ICD-10-CM | POA: Diagnosis not present

## 2021-08-16 DIAGNOSIS — H25812 Combined forms of age-related cataract, left eye: Secondary | ICD-10-CM | POA: Diagnosis not present

## 2021-08-16 DIAGNOSIS — H25012 Cortical age-related cataract, left eye: Secondary | ICD-10-CM | POA: Diagnosis not present

## 2021-08-21 DIAGNOSIS — I10 Essential (primary) hypertension: Secondary | ICD-10-CM | POA: Diagnosis not present

## 2021-08-29 DIAGNOSIS — E291 Testicular hypofunction: Secondary | ICD-10-CM | POA: Diagnosis not present

## 2021-08-29 DIAGNOSIS — R948 Abnormal results of function studies of other organs and systems: Secondary | ICD-10-CM | POA: Diagnosis not present

## 2021-08-31 DIAGNOSIS — C4441 Basal cell carcinoma of skin of scalp and neck: Secondary | ICD-10-CM | POA: Diagnosis not present

## 2021-08-31 DIAGNOSIS — L578 Other skin changes due to chronic exposure to nonionizing radiation: Secondary | ICD-10-CM | POA: Diagnosis not present

## 2021-08-31 DIAGNOSIS — D485 Neoplasm of uncertain behavior of skin: Secondary | ICD-10-CM | POA: Diagnosis not present

## 2021-08-31 DIAGNOSIS — C44722 Squamous cell carcinoma of skin of right lower limb, including hip: Secondary | ICD-10-CM | POA: Diagnosis not present

## 2021-08-31 DIAGNOSIS — L570 Actinic keratosis: Secondary | ICD-10-CM | POA: Diagnosis not present

## 2021-09-05 DIAGNOSIS — R3915 Urgency of urination: Secondary | ICD-10-CM | POA: Diagnosis not present

## 2021-09-05 DIAGNOSIS — N5201 Erectile dysfunction due to arterial insufficiency: Secondary | ICD-10-CM | POA: Diagnosis not present

## 2021-09-05 DIAGNOSIS — E291 Testicular hypofunction: Secondary | ICD-10-CM | POA: Diagnosis not present

## 2021-09-07 ENCOUNTER — Ambulatory Visit: Payer: PPO | Admitting: Family Medicine

## 2021-09-07 VITALS — BP 136/76 | Ht 70.0 in | Wt 185.0 lb

## 2021-09-07 DIAGNOSIS — M79672 Pain in left foot: Secondary | ICD-10-CM

## 2021-09-07 DIAGNOSIS — M79671 Pain in right foot: Secondary | ICD-10-CM | POA: Diagnosis not present

## 2021-09-07 NOTE — Assessment & Plan Note (Signed)
His orthotics are still in great shape and he has now gone back to his regular walking shoes.  Recommend continuing to use these as well as his custom orthotics.  Suspect that increased friction was created over the fifth metatarsal head causing the corn that is now visualized and tender.  Would not perform any cryotherapy to this area prior to his trip due to likely increase sensitivity.  Recommend using a pumice stone and corn pad.  Can follow-up as needed if not improving over the next few months to consider a podiatry referral.  In regards to his transient right foot pain, this now seems to have resolved and there is no concerning findings on examination or tenderness palpation.  He also has a reassuring negative hop.  Hopefully his pain will improve with getting back to his regular shoes and orthotics.

## 2021-09-07 NOTE — Progress Notes (Signed)
° °  Juan Hudson is a 76 y.o. male who presents to [facility] today for the following:  Bilateral Foot Pain Has custom orthotics, last made on 12/2019 States that a few weeks ago he got a new pair of [place] shoes States that with walking, he started to have pain at the lateral aspect of his fifth metatarsal head on the left Also started to notice that he was having some pain near the fourth and fifth metatarsal heads on the right He returned the shoes States that he is feeling better in other shoes, but was having little bit of pain with walking He has been resting and having improvement in his pain He has continued to use his custom orthotics He wants to get this checked out because he is going to [place] in a week and will be walking a lot   PMH reviewed.  ROS as above. Medications reviewed.  Exam:  BP 136/76    Ht 5\' 10"  (1.778 m)    Wt 185 lb (83.9 kg)    BMI 26.54 kg/m  Gen: Well NAD MSK:  Bilateral feet: Collapse of transverse and longitudinal arch with standing bilaterally.  Right second toe hammertoe.  Over the lateral fifth metatarsal head on the left there is a small corn that corresponds to his area of tenderness.  There is no bony tenderness over the fifth metatarsal on the left.  There is no significant tenderness to palpation on the right, no Mulder's click bilaterally.  He is neurovascular intact distally bilaterally.  He is negative hop bilaterally.  No results found.   Assessment and Plan: 1) Bilateral foot pain His orthotics are still in great shape and he has now gone back to his regular walking shoes.  Recommend continuing to use these as well as his custom orthotics.  Suspect that increased friction was created over the fifth metatarsal head causing the corn that is now visualized and tender.  Would not perform any cryotherapy to this area prior to his trip due to likely increase sensitivity.  Recommend using a pumice stone and corn pad.  Can follow-up as needed if not  improving over the next few months to consider a podiatry referral.  In regards to his transient right foot pain, this now seems to have resolved and there is no concerning findings on examination or tenderness palpation.  He also has a reassuring negative hop.  Hopefully his pain will improve with getting back to his regular shoes and orthotics.   Arizona Constable, D.O.  PGY-4 [facility]  09/07/2021 11:46 AM

## 2021-09-10 DIAGNOSIS — G4733 Obstructive sleep apnea (adult) (pediatric): Secondary | ICD-10-CM | POA: Diagnosis not present

## 2021-09-10 DIAGNOSIS — G4730 Sleep apnea, unspecified: Secondary | ICD-10-CM | POA: Diagnosis not present

## 2021-09-10 NOTE — Progress Notes (Signed)
[facility]: Attending Note: I have reviewed the chart, discussed wit the Sports Medicine Fellow. I agree with assessment and treatment plan as detailed in the Fellow's note.  

## 2021-09-11 ENCOUNTER — Ambulatory Visit: Payer: PPO | Admitting: Sports Medicine

## 2021-10-08 ENCOUNTER — Encounter (HOSPITAL_BASED_OUTPATIENT_CLINIC_OR_DEPARTMENT_OTHER): Payer: Self-pay | Admitting: Physical Therapy

## 2021-10-11 DIAGNOSIS — C44721 Squamous cell carcinoma of skin of unspecified lower limb, including hip: Secondary | ICD-10-CM | POA: Diagnosis not present

## 2021-10-11 DIAGNOSIS — C44729 Squamous cell carcinoma of skin of left lower limb, including hip: Secondary | ICD-10-CM | POA: Diagnosis not present

## 2021-10-27 IMAGING — CR DG OR LOCAL ABDOMEN
1 series · 1 of 1 positions shown · non-contrast
Comparison: None.

CLINICAL DATA: Anterolateral decompression and fusion at L2-3 and
L3-4. Checking for retained instruments.

EXAM:
OR LOCAL ABDOMEN

[pa rld]
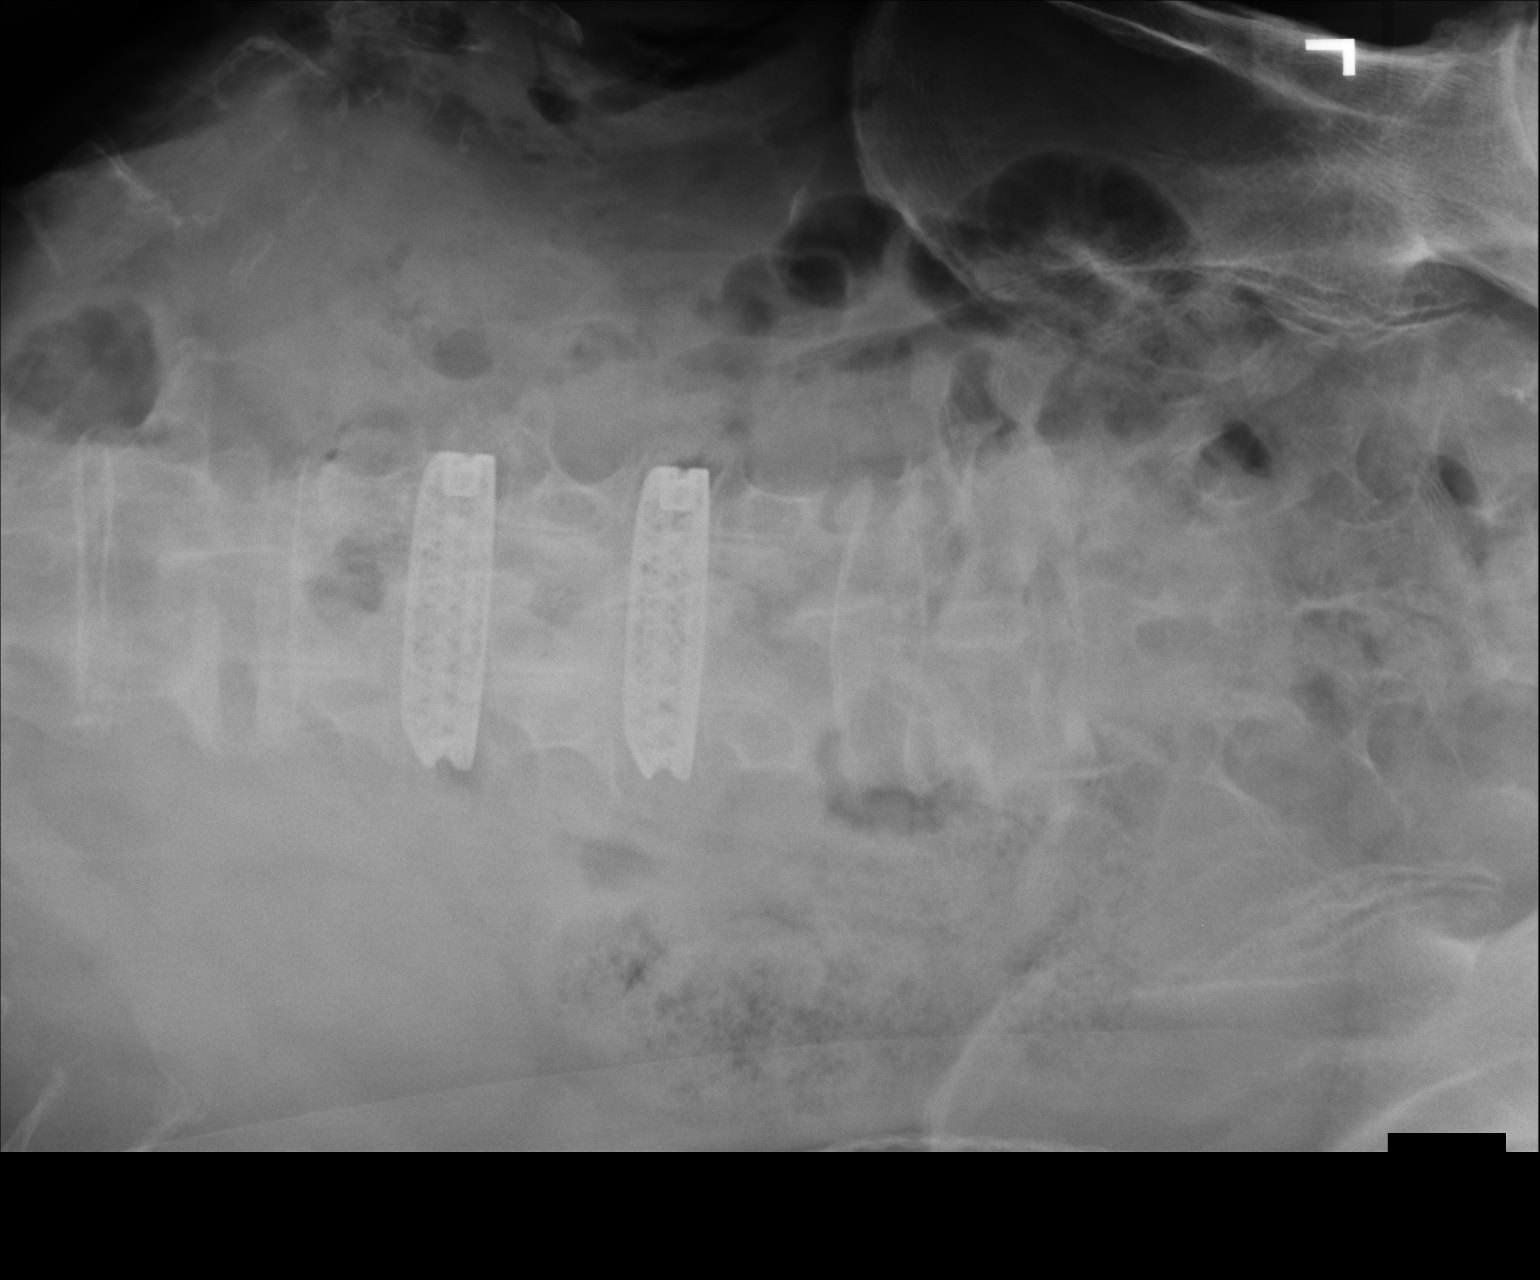

[1 of 1 positions shown; findings below may reference images not displayed]

FINDINGS: No retained instruments in the visible portions of the abdomen or
pelvis. Interbody fusion devices at L2-3 and L3-4.
IMPRESSION: No retained instruments.

## 2021-10-27 IMAGING — RF DG C-ARM 1-60 MIN
1 series · 4 of 4 positions shown · non-contrast
Comparison: CT 01/05/2020

CLINICAL DATA: Lumbar surgery

EXAM:
LUMBAR SPINE - 2-3 VIEW; DG C-ARM 1-60 MIN

[Series 1: run · 4 of 4 slices shown]
[im 1/4]
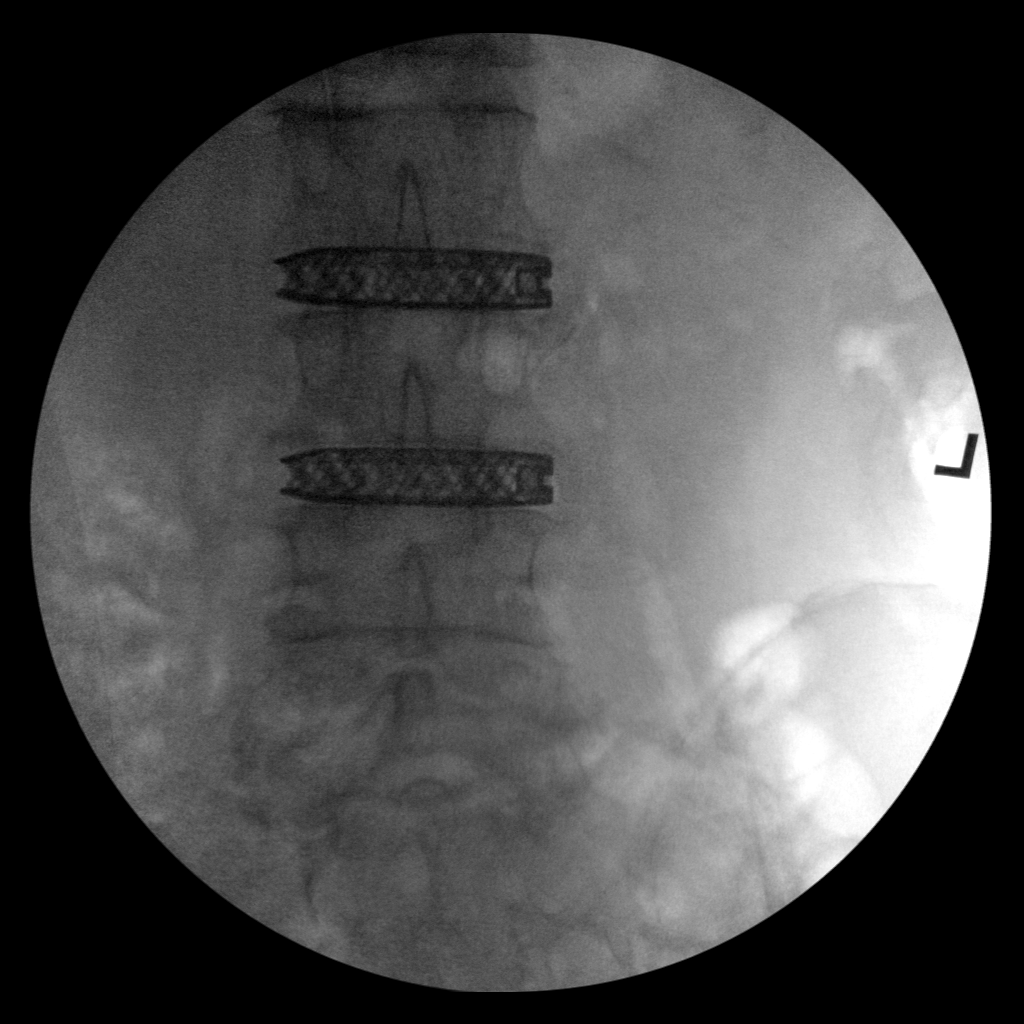
[im 2/4]
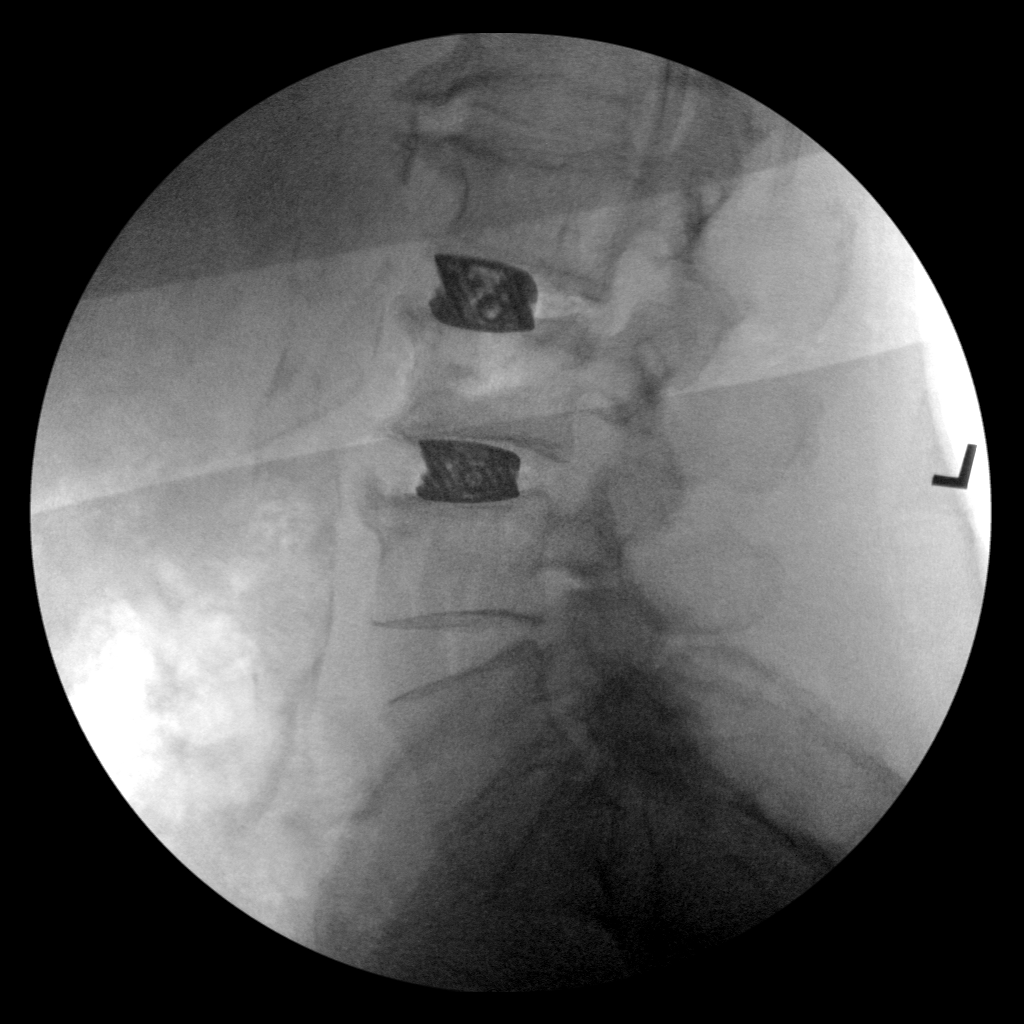
[im 3/4]
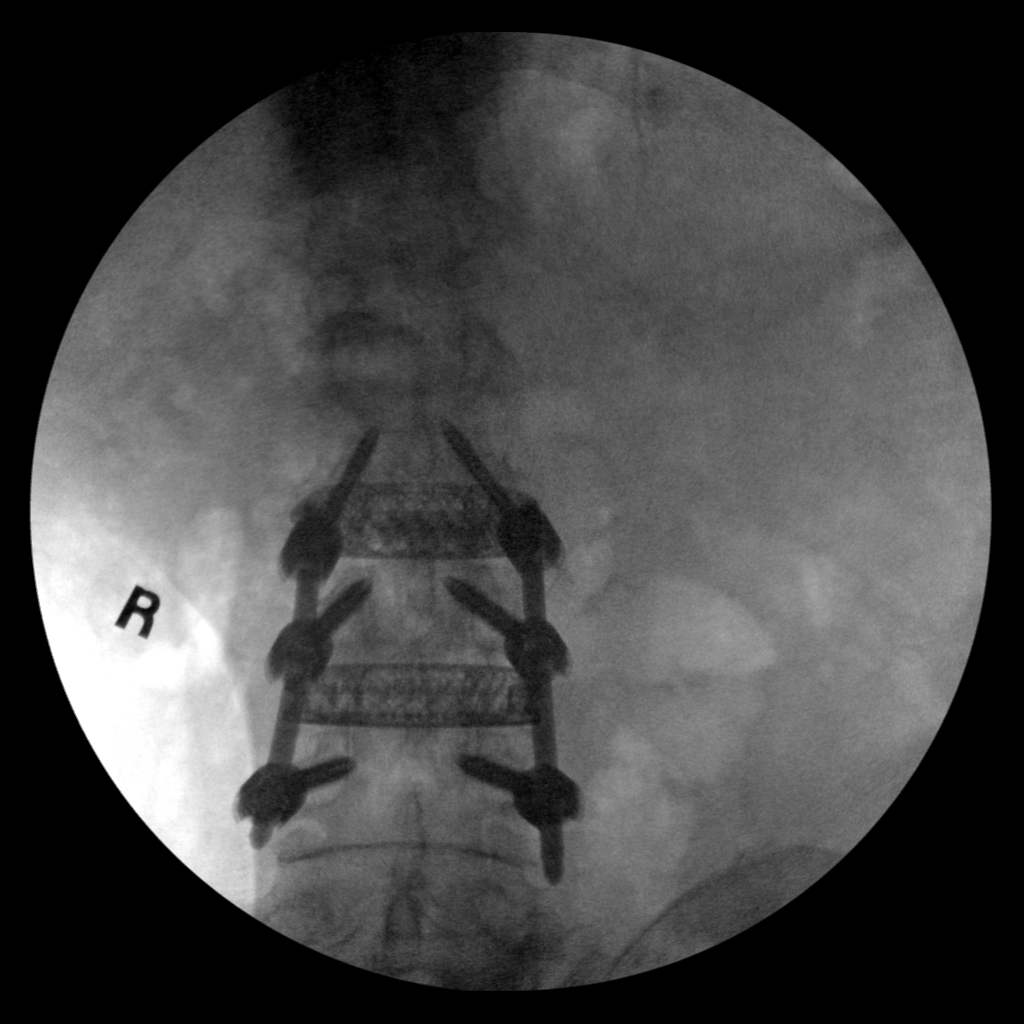
[im 4/4]
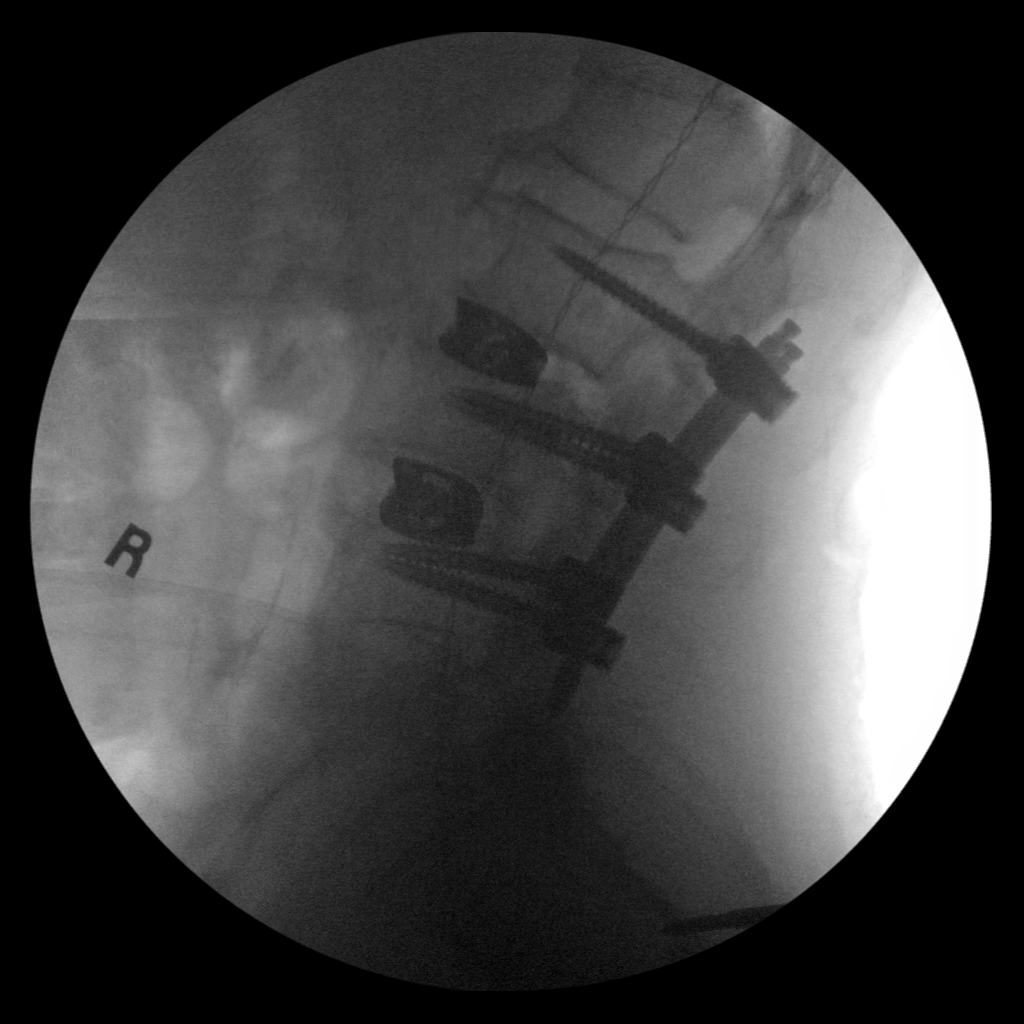

[4 of 4 positions shown; findings below may reference images not displayed]

FINDINGS: Four low resolution intraoperative spot views of the lumbar spine.
Total fluoroscopy time was 1 minutes 29 seconds. The images
demonstrate interbody devices at L2-L3 and L3-L4 with final images
demonstrating posterior rods and fixating screws L2 through L4.
IMPRESSION: Intraoperative fluoroscopic assistance provided during lumbar spine
surgery

## 2021-10-27 IMAGING — RF DG LUMBAR SPINE 2-3V
1 series · 4 of 4 positions shown · non-contrast
Comparison: CT 01/05/2020

CLINICAL DATA: Lumbar surgery

EXAM:
LUMBAR SPINE - 2-3 VIEW; DG C-ARM 1-60 MIN

[Series 1: run · 4 of 4 slices shown]
[im 1/4]
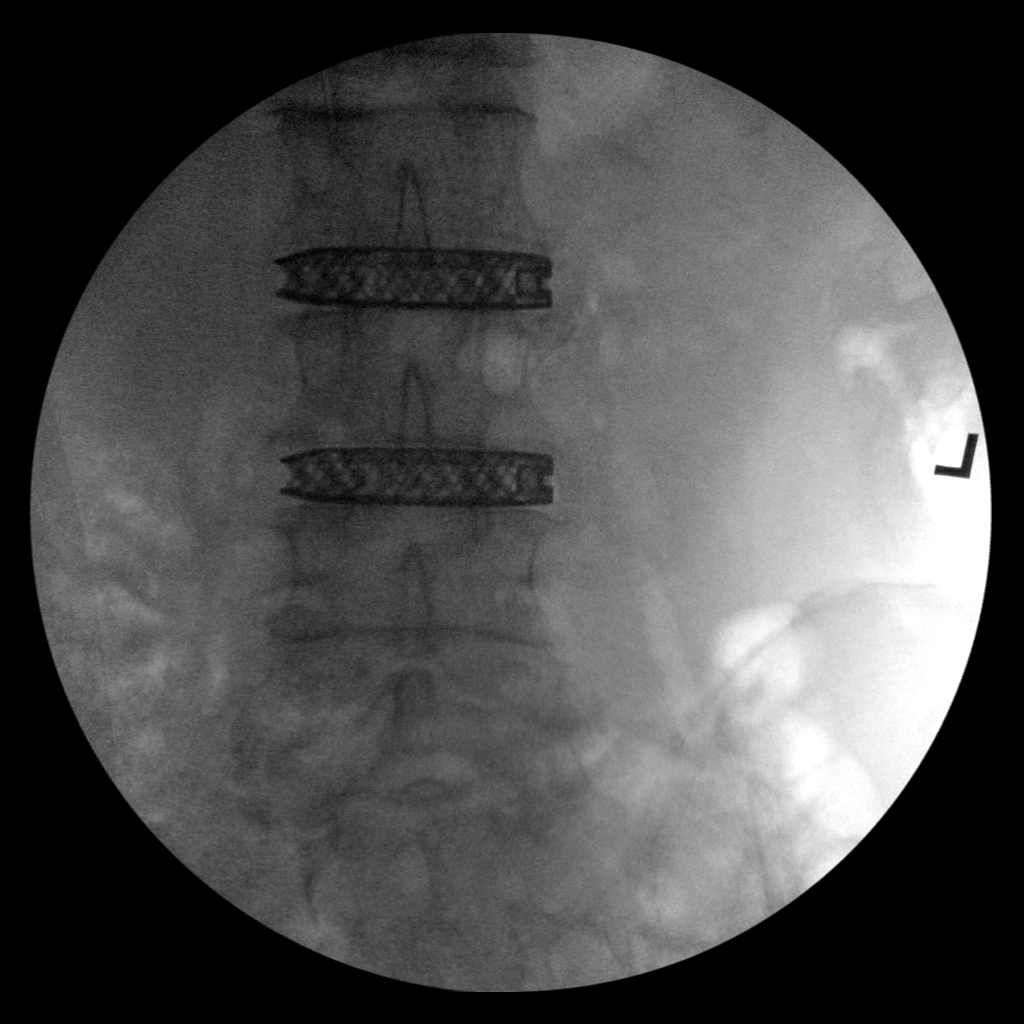
[im 2/4]
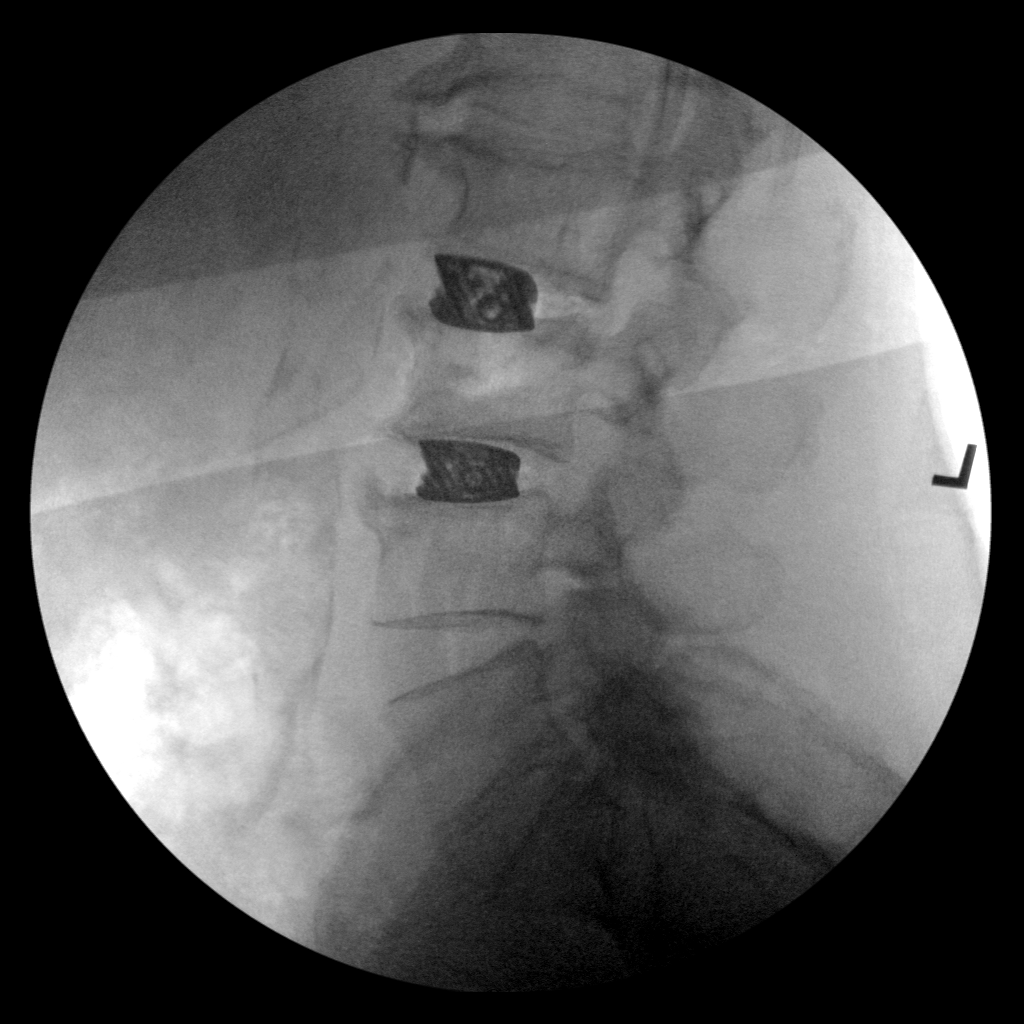
[im 3/4]
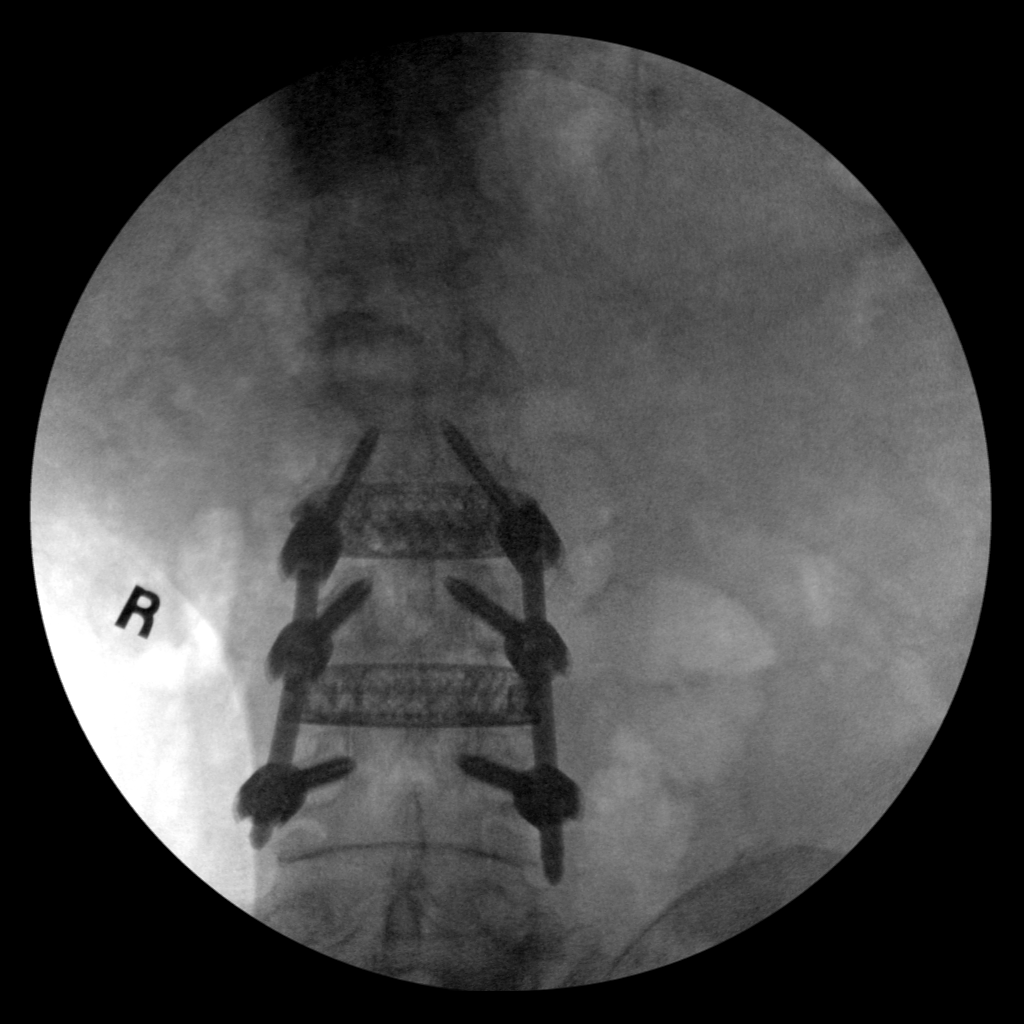
[im 4/4]
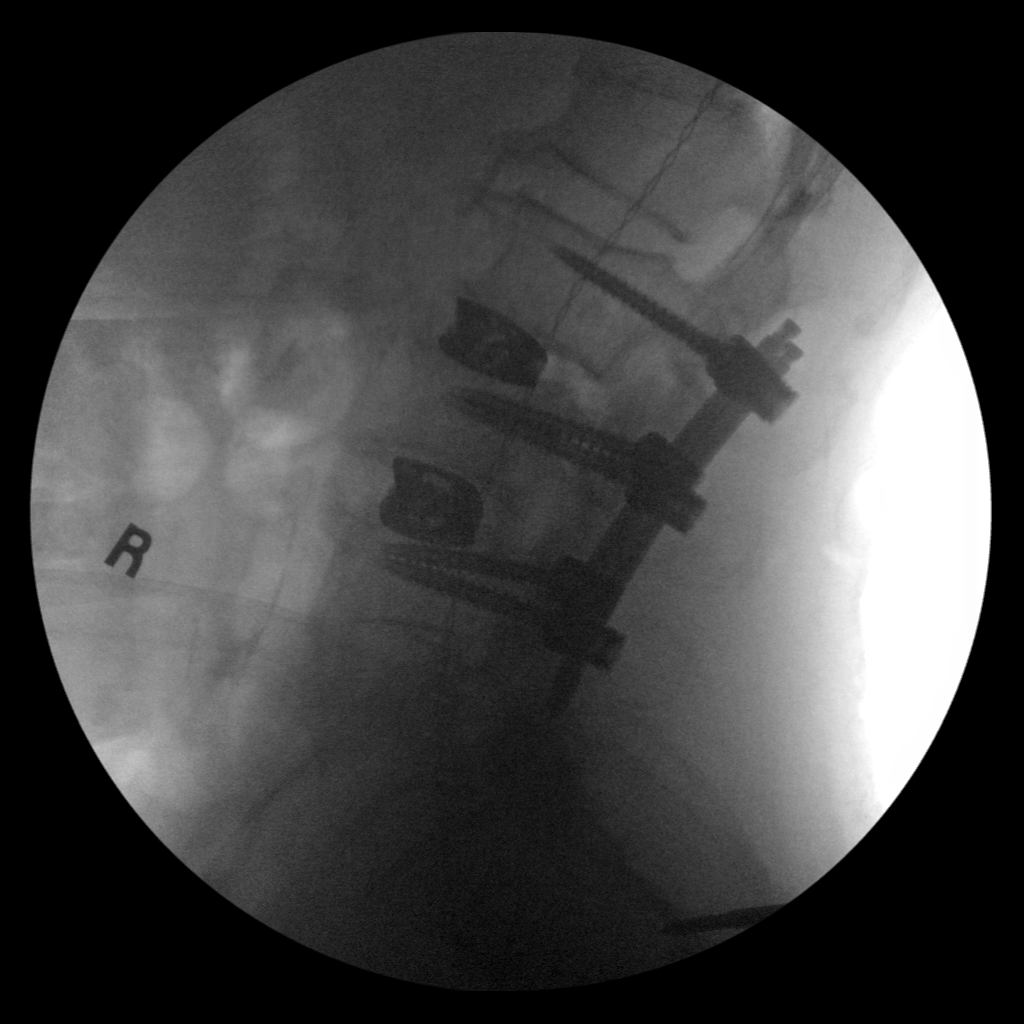

[4 of 4 positions shown; findings below may reference images not displayed]

FINDINGS: Four low resolution intraoperative spot views of the lumbar spine.
Total fluoroscopy time was 1 minutes 29 seconds. The images
demonstrate interbody devices at L2-L3 and L3-L4 with final images
demonstrating posterior rods and fixating screws L2 through L4.
IMPRESSION: Intraoperative fluoroscopic assistance provided during lumbar spine
surgery

## 2021-11-01 DIAGNOSIS — C4441 Basal cell carcinoma of skin of scalp and neck: Secondary | ICD-10-CM | POA: Diagnosis not present

## 2021-11-21 ENCOUNTER — Telehealth: Payer: Self-pay | Admitting: Internal Medicine

## 2021-11-21 DIAGNOSIS — G4733 Obstructive sleep apnea (adult) (pediatric): Secondary | ICD-10-CM

## 2021-11-22 NOTE — Telephone Encounter (Signed)
Lm for patient.  

## 2021-11-27 NOTE — Telephone Encounter (Signed)
Patient is returning phone call. Patient phone number is [phone]. ?

## 2021-11-27 NOTE — Telephone Encounter (Signed)
Called and spoke with patient. The message taken by the front desk is incorrect. He stated that he was calling to see if Dr. Annamaria Boots would be willing to write an order for a travel cpap machine. He has been in contact with his insurance company and they assured him that they will pay for it. He would like for the order to be sent to [facility].  ? ?I did explain to him that some of patients end up ordering the travel cpap machines online from [email or web address] etc. He verbalized understanding.  ? ?He also wanted to know if Dr. Annamaria Boots had any preferences of the travel cpap machine.  ? ?I looked at [facility] and it looks like his current machine is sat at 5-15cm.  ? ?Dr. Annamaria Boots, can you please advise? Thanks!  ?

## 2021-11-27 NOTE — Telephone Encounter (Signed)
Ok order DME  [facility]-   Travel CPAP machine Transcend or AirMini  auto 5-15, mask of choice, supplies/ hoses/ filters, SD card for downloads or AirView   Dx OSA ?

## 2021-11-29 DIAGNOSIS — L570 Actinic keratosis: Secondary | ICD-10-CM | POA: Diagnosis not present

## 2021-11-29 DIAGNOSIS — L723 Sebaceous cyst: Secondary | ICD-10-CM | POA: Diagnosis not present

## 2021-11-29 DIAGNOSIS — D485 Neoplasm of uncertain behavior of skin: Secondary | ICD-10-CM | POA: Diagnosis not present

## 2021-11-29 DIAGNOSIS — L578 Other skin changes due to chronic exposure to nonionizing radiation: Secondary | ICD-10-CM | POA: Diagnosis not present

## 2021-11-29 DIAGNOSIS — C4442 Squamous cell carcinoma of skin of scalp and neck: Secondary | ICD-10-CM | POA: Diagnosis not present

## 2021-11-29 DIAGNOSIS — L821 Other seborrheic keratosis: Secondary | ICD-10-CM | POA: Diagnosis not present

## 2021-11-29 DIAGNOSIS — Z85828 Personal history of other malignant neoplasm of skin: Secondary | ICD-10-CM | POA: Diagnosis not present

## 2021-11-29 DIAGNOSIS — D225 Melanocytic nevi of trunk: Secondary | ICD-10-CM | POA: Diagnosis not present

## 2021-11-29 NOTE — Telephone Encounter (Signed)
Called and spoke with patient. He verbalized understanding. He would like to receive the AirMini since he is familiar with [employer] machines. Advised him that I would go ahead and place the order for him. He is also aware that [facility] will call him once they have received the order.  ? ?Order has been placed.  ? ?Nothing further needed at time of call.  ?

## 2021-12-19 ENCOUNTER — Telehealth: Payer: Self-pay | Admitting: Internal Medicine

## 2021-12-20 NOTE — Telephone Encounter (Signed)
I called the patient to let him know that I have placed and order and I have reached out to [facility] for further information. He is aware we will give him a call once I have a response.

## 2021-12-21 NOTE — Telephone Encounter (Signed)
Order has been received and worked. they have been trying to reach the patient to notify him that we no long provide travel cpaps. We are missing face 2 face notes to process the Supply order.   request for missing info:   " New, Kristopher Glee to Lenise Herald, Oran Rein,   To process this order I need face 2 face notes showing usage and benefit within the last year. I can not use phone message notes.   It does not look like patient has been seen for cpap in over a year."     Thank you,   Leroy Sea [place]   He has an updated OV.

## 2022-01-16 ENCOUNTER — Encounter: Payer: Self-pay | Admitting: Internal Medicine

## 2022-01-16 NOTE — Progress Notes (Signed)
Subjective:    Patient ID: Juan Hudson, male    DOB: [birth year], 76 y.o.   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  He had UPPP surgery and septoplasty by Dr Wilburn Cornelia, but unsuccessfull.  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  He dropped goal of learning to fly.  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 Doing well with CPAP with no expressed concerns.  Download reviewed.  Time to replace old machine. He is considering ordering a travel CPAP machine for convenience and we can give him the information. Slow recovery after hip replacement last year but feels back to baseline now.  ROS-see HPI   + = positive Constitutional:    weight loss, night sweats, fevers, chills, fatigue, lassitude. HEENT:    headaches, difficulty swallowing, tooth/dental problems, sore throat,       sneezing, itching, ear ache, nasal congestion, post nasal drip, snoring CV:    chest pain, orthopnea, PND, swelling in lower extremities, anasarca,                          dizziness, palpitations Resp:   shortness of breath with exertion or at rest.                +productive cough,   non-productive  cough, coughing up of blood.              change in color of mucus.  wheezing.   Skin:    rash or lesions. GI:  No-   heartburn, indigestion, abdominal pain, nausea, vomiting, diarrhea,                 change in bowel habits, loss of appetite GU: dysuria, change in color of urine, no urgency or frequency.   flank pain. MS:   joint pain, stiffness, decreased range of motion, back pain. Neuro-     nothing unusual Psych:  change in mood or affect.  depression or anxiety.   memory loss.    Objective:   Physical Exam General- Alert, Oriented, Affect-appropriate, Distress- none acute , healthy appearing Skin- no rash Lymphadenopathy- none Head- atraumatic            Eyes- Gross vision intact, PERRLA, conjunctivae clear secretions            Ears- Hearing, canals normal            Nose- Clear, No-Septal dev, mucus, polyps, erosion, perforation             Throat- S/p UPPP , mucosa clear , drainage- none, tonsils- atrophic Neck- flexible , trachea midline, no stridor , thyroid nl, carotid no bruit Chest - symmetrical excursion , unlabored           Heart/CV- RRR ,  WVPXTG+6/2 systolic/ aortic ,  no gallop  , no rub, nl s1 s2                           - JVD- none , edema- none, stasis changes- none, varices- none           Lung- clear to P&A, wheeze- none, cough- none , dullness-none, rub- none           Chest wall-  Abd- tender-no, distended-no, bowel sounds-present, HSM- no Br/ Gen/ Rectal- Not done, not indicated Extrem- cyanosis- none, clubbing, none, atrophy- none, strength- nl Neuro- grossly intact to observation    Assessment & Plan:

## 2022-01-17 ENCOUNTER — Telehealth: Payer: Self-pay | Admitting: Internal Medicine

## 2022-01-17 ENCOUNTER — Ambulatory Visit: Payer: PPO | Admitting: Internal Medicine

## 2022-01-17 ENCOUNTER — Encounter: Payer: Self-pay | Admitting: Internal Medicine

## 2022-01-17 VITALS — BP 128/80 | HR 71 | Temp 97.6°F | Ht 70.0 in | Wt 186.4 lb

## 2022-01-17 DIAGNOSIS — G4733 Obstructive sleep apnea (adult) (pediatric): Secondary | ICD-10-CM

## 2022-01-17 DIAGNOSIS — R011 Cardiac murmur, unspecified: Secondary | ICD-10-CM | POA: Diagnosis not present

## 2022-01-17 NOTE — Telephone Encounter (Signed)
Pt spoke with insurance and wants [place] to send prescription for CPAP [facility], [facility] or [facility]. Those are the three that supply for his insurance.

## 2022-01-17 NOTE — Patient Instructions (Signed)
Order- DME [facility]- please replace old CPAP machine auto 5-15, mask of choice, humidifier, supplies, AirView/ card  You can talk with [facility] about getting a "travel" CPAP machine through them, or you can go on-line. Most people choose to let insurance pay for their every-day machine, and self-pay for the travel machine.        One source- CPAP.com     there are others             Look for models  Transcend or AirMini                                          Set autopap 5-15 cwp, mask of your choice, hoses, filters, supplies  Please cal if we can help

## 2022-01-18 NOTE — Telephone Encounter (Signed)
Called and spoke with patient to let him know that cpap order has been sent to [place]. He expressed understanding. He states that he would like to get the one for home from [place] and would also like to order one online and would like a prescription for it. Advised patient I would ask Dr. Annamaria Boots and once it's done I would call and let him know it's ready for pick up.   Dr. Annamaria Boots are you ok with this.

## 2022-01-21 NOTE — Telephone Encounter (Signed)
Order has been printed and signed by Rexene Edison since Dr. Annamaria Boots is off this week. Given to patient. Nothing further needed at this time.

## 2022-01-23 DIAGNOSIS — Z125 Encounter for screening for malignant neoplasm of prostate: Secondary | ICD-10-CM | POA: Diagnosis not present

## 2022-01-23 DIAGNOSIS — Z0000 Encounter for general adult medical examination without abnormal findings: Secondary | ICD-10-CM | POA: Diagnosis not present

## 2022-01-23 DIAGNOSIS — I10 Essential (primary) hypertension: Secondary | ICD-10-CM | POA: Diagnosis not present

## 2022-01-23 DIAGNOSIS — E785 Hyperlipidemia, unspecified: Secondary | ICD-10-CM | POA: Diagnosis not present

## 2022-01-23 DIAGNOSIS — E291 Testicular hypofunction: Secondary | ICD-10-CM | POA: Diagnosis not present

## 2022-01-23 DIAGNOSIS — M79605 Pain in left leg: Secondary | ICD-10-CM | POA: Diagnosis not present

## 2022-01-24 DIAGNOSIS — R82998 Other abnormal findings in urine: Secondary | ICD-10-CM | POA: Diagnosis not present

## 2022-01-24 DIAGNOSIS — I10 Essential (primary) hypertension: Secondary | ICD-10-CM | POA: Diagnosis not present

## 2022-01-29 DIAGNOSIS — N3281 Overactive bladder: Secondary | ICD-10-CM | POA: Diagnosis not present

## 2022-01-29 DIAGNOSIS — E291 Testicular hypofunction: Secondary | ICD-10-CM | POA: Diagnosis not present

## 2022-01-29 DIAGNOSIS — Z1331 Encounter for screening for depression: Secondary | ICD-10-CM | POA: Diagnosis not present

## 2022-01-29 DIAGNOSIS — R413 Other amnesia: Secondary | ICD-10-CM | POA: Diagnosis not present

## 2022-01-29 DIAGNOSIS — Z0000 Encounter for general adult medical examination without abnormal findings: Secondary | ICD-10-CM | POA: Diagnosis not present

## 2022-01-29 DIAGNOSIS — I351 Nonrheumatic aortic (valve) insufficiency: Secondary | ICD-10-CM | POA: Diagnosis not present

## 2022-01-29 DIAGNOSIS — Z1389 Encounter for screening for other disorder: Secondary | ICD-10-CM | POA: Diagnosis not present

## 2022-01-29 DIAGNOSIS — I2510 Atherosclerotic heart disease of native coronary artery without angina pectoris: Secondary | ICD-10-CM | POA: Diagnosis not present

## 2022-01-29 DIAGNOSIS — I10 Essential (primary) hypertension: Secondary | ICD-10-CM | POA: Diagnosis not present

## 2022-01-29 DIAGNOSIS — E785 Hyperlipidemia, unspecified: Secondary | ICD-10-CM | POA: Diagnosis not present

## 2022-01-29 DIAGNOSIS — R011 Cardiac murmur, unspecified: Secondary | ICD-10-CM | POA: Diagnosis not present

## 2022-01-30 DIAGNOSIS — L309 Dermatitis, unspecified: Secondary | ICD-10-CM | POA: Diagnosis not present

## 2022-01-30 DIAGNOSIS — C4442 Squamous cell carcinoma of skin of scalp and neck: Secondary | ICD-10-CM | POA: Diagnosis not present

## 2022-01-30 DIAGNOSIS — L570 Actinic keratosis: Secondary | ICD-10-CM | POA: Diagnosis not present

## 2022-02-06 DIAGNOSIS — G4733 Obstructive sleep apnea (adult) (pediatric): Secondary | ICD-10-CM | POA: Diagnosis not present

## 2022-02-11 ENCOUNTER — Telehealth: Payer: Self-pay | Admitting: Internal Medicine

## 2022-02-11 DIAGNOSIS — G4733 Obstructive sleep apnea (adult) (pediatric): Secondary | ICD-10-CM

## 2022-02-11 NOTE — Telephone Encounter (Signed)
I spoke with the pt  He lost printed rx for travel CPAP  I printed new one and had CDY sign  He will pick up tomorrow  This was placed up front for pick up  Nothing further needed

## 2022-02-22 NOTE — Assessment & Plan Note (Signed)
No change that I can appreciate at this visit.  Cardiology to follow.

## 2022-02-22 NOTE — Assessment & Plan Note (Signed)
He continues to benefit from CPAP with good compliance and control. Plan-replace old CPAP machine, continuing auto 5-15.  Consider buying a travel CPAP as discussed.

## 2022-02-26 DIAGNOSIS — L570 Actinic keratosis: Secondary | ICD-10-CM | POA: Diagnosis not present

## 2022-02-26 DIAGNOSIS — D225 Melanocytic nevi of trunk: Secondary | ICD-10-CM | POA: Diagnosis not present

## 2022-02-26 DIAGNOSIS — L578 Other skin changes due to chronic exposure to nonionizing radiation: Secondary | ICD-10-CM | POA: Diagnosis not present

## 2022-02-26 DIAGNOSIS — C44529 Squamous cell carcinoma of skin of other part of trunk: Secondary | ICD-10-CM | POA: Diagnosis not present

## 2022-02-26 DIAGNOSIS — L719 Rosacea, unspecified: Secondary | ICD-10-CM | POA: Diagnosis not present

## 2022-02-26 DIAGNOSIS — D485 Neoplasm of uncertain behavior of skin: Secondary | ICD-10-CM | POA: Diagnosis not present

## 2022-02-26 DIAGNOSIS — L821 Other seborrheic keratosis: Secondary | ICD-10-CM | POA: Diagnosis not present

## 2022-02-26 DIAGNOSIS — Z85828 Personal history of other malignant neoplasm of skin: Secondary | ICD-10-CM | POA: Diagnosis not present

## 2022-03-09 DIAGNOSIS — G4733 Obstructive sleep apnea (adult) (pediatric): Secondary | ICD-10-CM | POA: Diagnosis not present

## 2022-03-11 DIAGNOSIS — R4181 Age-related cognitive decline: Secondary | ICD-10-CM | POA: Diagnosis not present

## 2022-03-11 DIAGNOSIS — R413 Other amnesia: Secondary | ICD-10-CM | POA: Diagnosis not present

## 2022-03-28 ENCOUNTER — Telehealth: Payer: Self-pay | Admitting: Internal Medicine

## 2022-03-28 DIAGNOSIS — G4733 Obstructive sleep apnea (adult) (pediatric): Secondary | ICD-10-CM

## 2022-04-01 NOTE — Telephone Encounter (Signed)
His CPAP machine needs to be reset to raise the pressure limit. Please order DME [facility] - change autopap range to 5-20 Thanks

## 2022-04-01 NOTE — Telephone Encounter (Signed)
Called and spoke with pt letting him know the info per CY and he verbalized understanding. Rx sent to [facility] to have settings changed to 5-20. Nothing further needed.

## 2022-04-01 NOTE — Telephone Encounter (Signed)
Download printed of pt's data for CY to review.  Called and spoke with pt letting him know that we were going to have CY review download to see what he recommended and stated that we would call him back after this and he verbalized understanding.  Routing encounter to CY and placing download at station for him to review.

## 2022-04-02 NOTE — Progress Notes (Signed)
Subjective:    Patient ID: Juan Hudson, male    DOB: [birth year], 76 y.o.   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  He had UPPP surgery and septoplasty by Dr Wilburn Cornelia, but unsuccessfull.  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 Doing well with CPAP with no expressed concerns.  Download reviewed.  Time to replace old machine. He is considering ordering a travel CPAP machine for convenience and we can give him the information. Slow recovery after hip replacement last year but feels back to baseline now.  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 He thinks the pressure may be too high. Getting approx 7-8hr/night.  This machine only a few weeks old. He doesn't think it is giving full credit for time slept and has discussed with DME. Our download shows 6 hours 51 minutes. Small nasal mask seems to fit ok. We will turn pressure down and see if that helps.  ROS-see HPI   + = positive Constitutional:    weight loss, night sweats, fevers, chills, fatigue, lassitude. HEENT:    headaches,  difficulty swallowing, tooth/dental problems, sore throat,       sneezing, itching, ear ache, nasal congestion, post nasal drip, snoring CV:    chest pain, orthopnea, PND, swelling in lower extremities, anasarca,                          dizziness, palpitations Resp:   shortness of breath with exertion or at rest.                +productive cough,   non-productive cough, coughing up of blood.              change in color of mucus.  wheezing.   Skin:    rash or lesions. GI:  No-   heartburn, indigestion, abdominal pain, nausea, vomiting, diarrhea,                 change in bowel habits, loss of appetite GU: dysuria, change in color of urine, no urgency or frequency.   flank pain. MS:   joint pain, stiffness, decreased range of motion, back pain. Neuro-     nothing unusual Psych:  change in mood or affect.  depression or anxiety.   memory loss.    Objective:   Physical Exam General- Alert, Oriented, Affect-appropriate, Distress- none acute , healthy appearing Skin- no rash Lymphadenopathy- none Head- atraumatic            Eyes- Gross vision intact, PERRLA, conjunctivae clear secretions            Ears- Hearing, canals normal  Nose- Clear, No-Septal dev, mucus, polyps, erosion, perforation             Throat- S/p UPPP , mucosa clear , drainage- none, tonsils- atrophic Neck- flexible , trachea midline, no stridor , thyroid nl, carotid no bruit Chest - symmetrical excursion , unlabored           Heart/CV- RRR ,  ELMRAJ+5/1 systolic/ aortic , no gallop  , no rub, nl s1 s2                           - JVD- none , edema- none, stasis changes- none, varices- none           Lung- clear to P&A, wheeze- none, cough- none , dullness-none, rub- none           Chest wall-  Abd- tender-no, distended-no, bowel sounds-present, HSM- no Br/ Gen/ Rectal- Not done, not indicated Extrem- cyanosis- none, clubbing, none, atrophy- none, strength- nl Neuro- grossly intact to observation     Assessment & Plan:

## 2022-04-04 ENCOUNTER — Ambulatory Visit: Payer: PPO | Admitting: Internal Medicine

## 2022-04-04 ENCOUNTER — Encounter: Payer: Self-pay | Admitting: Internal Medicine

## 2022-04-04 VITALS — BP 122/78 | HR 61 | Ht 69.0 in | Wt 185.2 lb

## 2022-04-04 DIAGNOSIS — G4733 Obstructive sleep apnea (adult) (pediatric): Secondary | ICD-10-CM

## 2022-04-04 DIAGNOSIS — Z23 Encounter for immunization: Secondary | ICD-10-CM

## 2022-04-04 DIAGNOSIS — R011 Cardiac murmur, unspecified: Secondary | ICD-10-CM

## 2022-04-04 NOTE — Patient Instructions (Signed)
Order- DME [facility] please change auto range to 5-15. Continue mask of choice, humidifier, supplies, AirView/ card  Order- flu vax- senior  Please call if we can help

## 2022-04-04 NOTE — Assessment & Plan Note (Signed)
Benefits from CPAP. We will reduce pressure to reduce leak. Plan- change to 5-15. Watch leak and recorded time slept.

## 2022-04-04 NOTE — Assessment & Plan Note (Signed)
ECHO 2022 indicates Aortic regurg with structurally unremarkable valve.

## 2022-04-06 IMAGING — CT CT VENOGRAM ABD-PELV
2 of 10 series · 10 of 46 positions shown, 11 images · IV contrast (iopamidol)
Comparison: CT abdomen pelvis-05/05/2019; lumbar spine
CT-01/05/2020; lumbar spine radiographs-01/11/2020

CLINICAL DATA: History of varicose vein surgery with persistent
right lower leg swelling for the past 3 months. Evaluate for central
venous occlusion.

EXAM:
CT ABDOMEN AND PELVIS WITH CONTRAST
TECHNIQUE: Multidetector CT imaging of the abdomen and pelvis was performed
using the standard protocol following bolus administration of
intravenous contrast.
CONTRAST:  100mL GHXRZ1-KAL IOPAMIDOL (GHXRZ1-KAL) INJECTION 76%

[Series 2: abd pelvis portal venous 5.00 br40 s3 · axial · portal-venous · 0.55mm/px · z∈[+1141,+1566]mm · 8 of 105 slices shown, 9 images]
[im 10/105  soft-tissue]
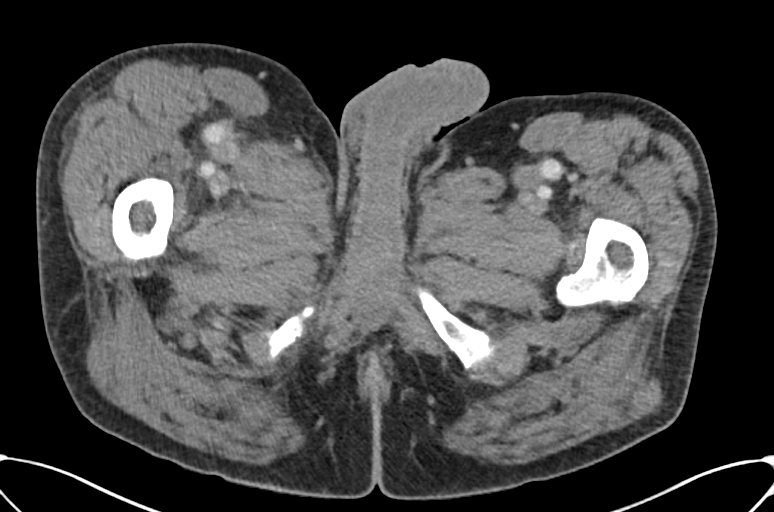
[im 10/105  bone]
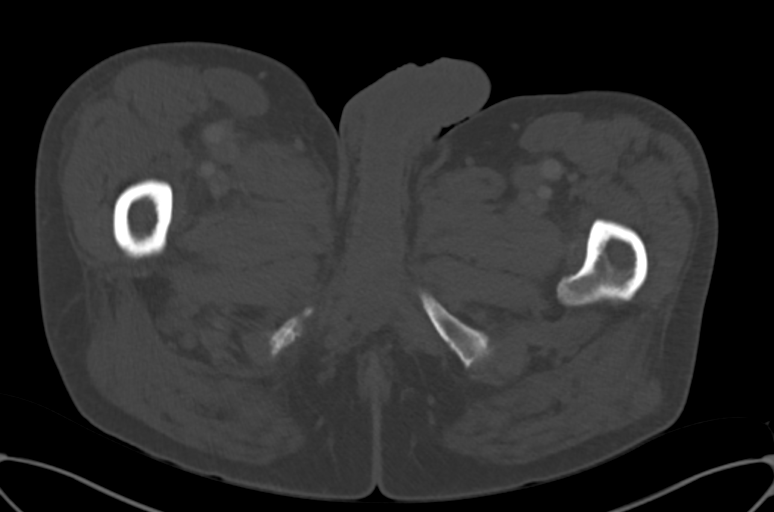
[im 19/105  soft-tissue]
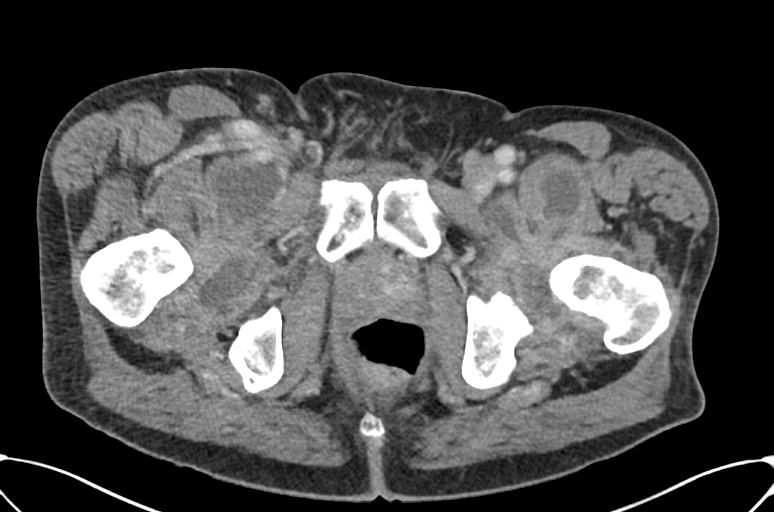
[im 38/105  soft-tissue]
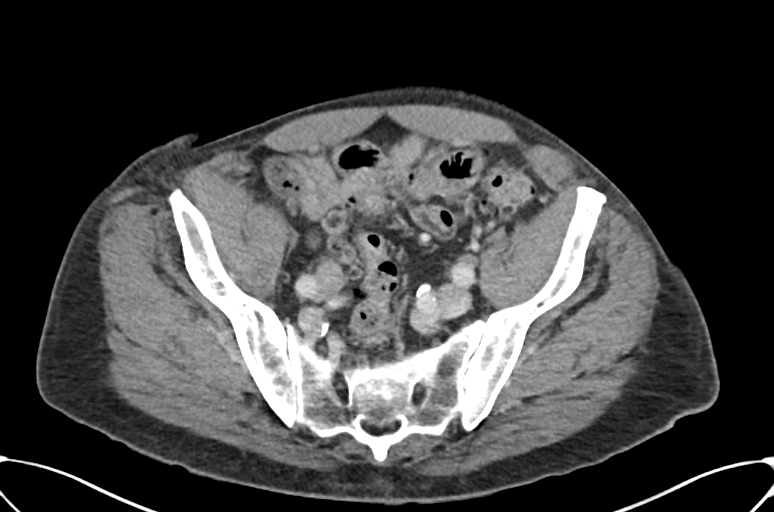
[im 48/105  soft-tissue]
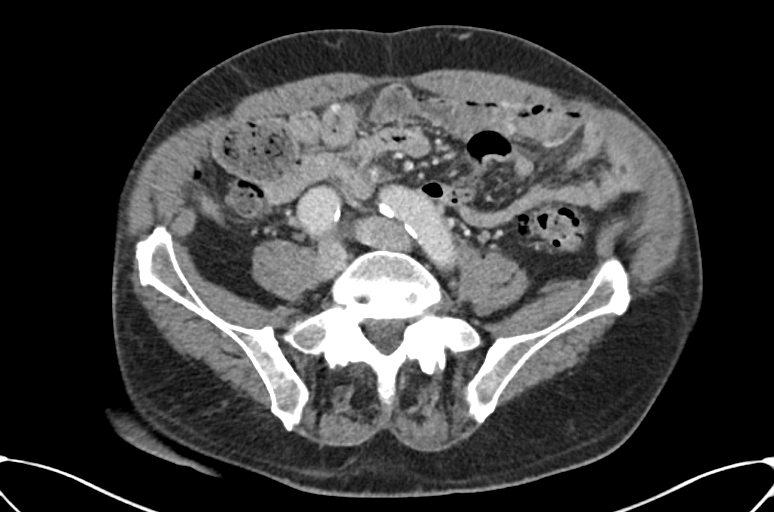
[im 57/105  soft-tissue]
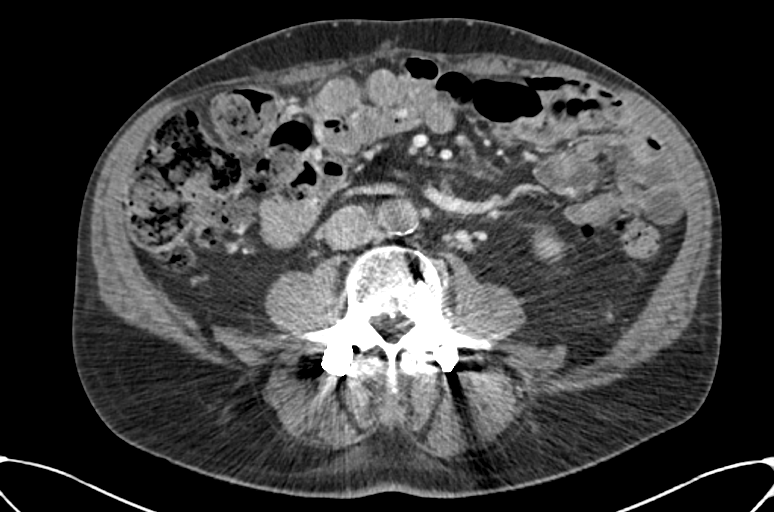
[im 67/105  soft-tissue]
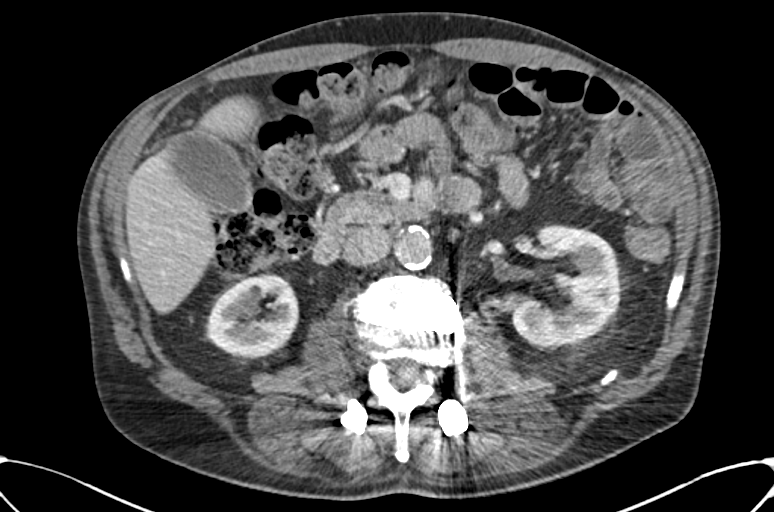
[im 86/105  soft-tissue]
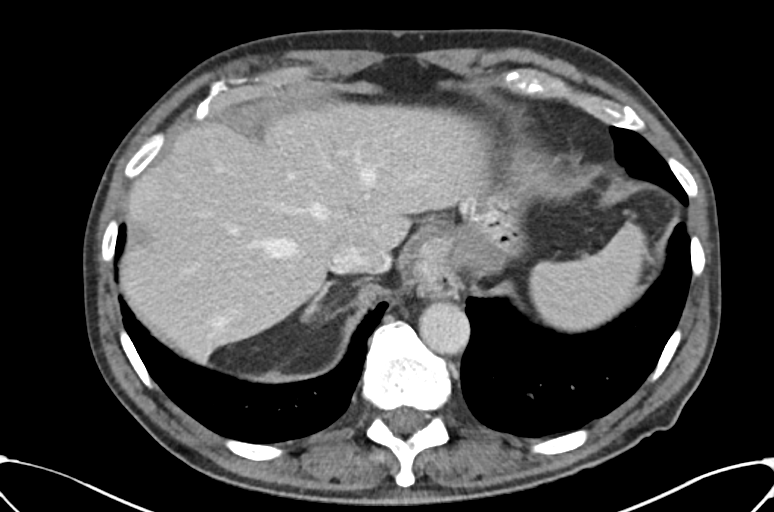
[im 95/105  soft-tissue]
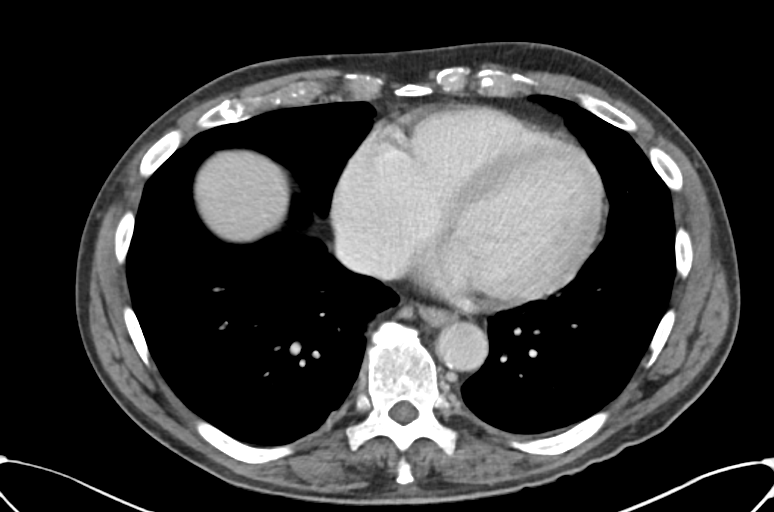

[Series 6: abd pelvis portal venous 2.00 br40 s3 · coronal · portal-venous · 0.77mm/px · 2 of 138 slices shown]
[im 46/138  soft-tissue]
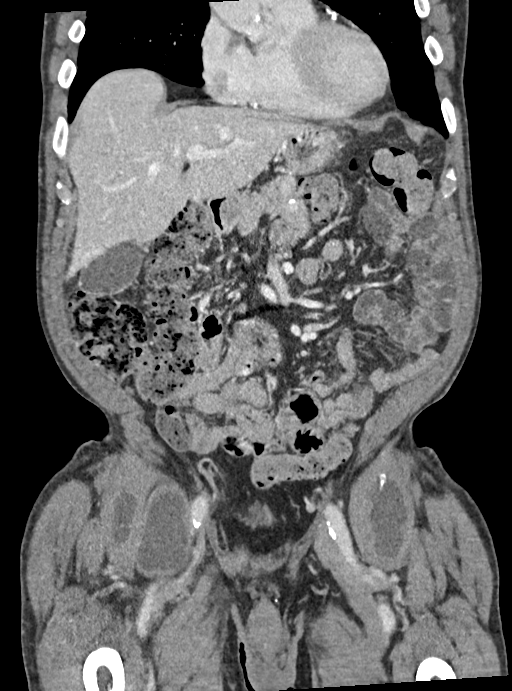
[im 92/138  soft-tissue]
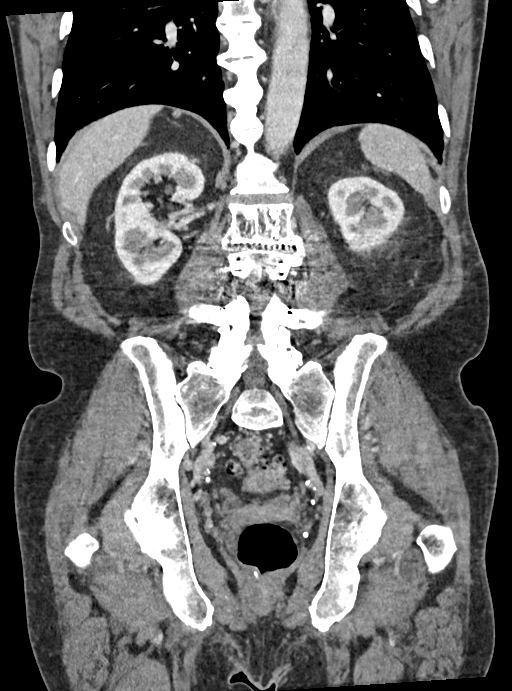

[10 of 46 positions shown; findings below may reference images not displayed]

FINDINGS: Lower chest: Limited visualization of the lower thorax is negative
for focal airspace opacity or pleural effusion.

Normal heart size. Coronary artery calcifications. No pericardial
effusion.

Hepatobiliary: Normal hepatic contour. No discrete hepatic lesions.
Normal appearance of the gallbladder given degree distention. No
radiopaque gallstones. No intra or extrahepatic biliary ductal
dilatation. No ascites.

Pancreas: Normal appearance of the pancreas.

Spleen: Note is again made of an approximately 1.2 cm flash filling
hemangioma involving the anterior tip of the spleen (image 25,
series 2, similar to abdominal CT performed [DATE]. No perisplenic
stranding.

Adrenals/Urinary Tract: There is symmetric enhancement and excretion
of the bilateral kidneys. No renal stones on this postcontrast
examination. No discrete renal lesions. No urinary obstruction or
perinephric stranding.

There is mild thickening of the crux of the bilateral adrenal glands
without discrete nodule. The urinary bladder is underdistended.

Stomach/Bowel: Rather extensive colonic diverticulosis, primarily
involving the descending and sigmoid colon, without evidence of
superimposed acute diverticulitis. Moderate colonic stool burden
without evidence of enteric obstruction. Normal appearance of the
terminal ileum and the appendix. No discrete areas of bowel wall
thickening. Small hiatal hernia. No pneumoperitoneum, pneumatosis or
portal venous gas.

Vascular/Lymphatic: Moderate amount of mixed calcified and
noncalcified atherosclerotic plaque throughout a normal caliber
abdominal aorta, not definitely resulting in hemodynamically
significant narrowing on this non CTA examination. The major branch
vessels of the abdominal aorta appear patent on this non CTA
examination.

Mild ectasia of the bilateral common iliac arteries the right
measuring approximately 2.2 cm, the left measuring approximately
cm (image 58, series 2).

The dominant large right anterior periarticular fluid collection
results in mass effect upon the right common femoral vein however
the pelvic venous system and IVC appear widely patent.

No bulky retroperitoneal, mesenteric, pelvic or inguinal
lymphadenopathy.

Reproductive: Borderline enlarged heterogeneously enhancing prostate
with mass effect on the undersurface of the urinary bladder. The
prostate measures approximately 6.1 x 4.6 x 5.1 cm (axial image 84,
series 2; sagittal image 99, series 8).

Other: Subcutaneous edema about the midline of the low back.

Musculoskeletal: Severe degenerative change of the bilateral hips
with complete joint space loss bone-on-bone articulation subchondral
sclerosis and cyst formation (representative coronal image 77,
series 6), progressed compared to abdominal CT performed 05/04/2020.

These findings are associated with development of complex adjacent
fluid collections about the bilateral hips which likely communicate
with the bilateral hip joint spaces with dominant collection
anterior to the right hip joint space measuring at least 9.7 x 4.3 x
4.6 cm (axial image 83, series 2; coronal image 57, series 6), and
dominant collection anterior to the left hip joint space measuring
approximately 6.9 x 3.1 x 4.1 cm (coronal image 48, series 6; axial
image 80, series 2). Note, while incompletely imaged the pelvic
extension of the right-sided periarticular fluid collection was not
seen on lumbar spine CT performed [DATE].

Post L2-L4 paraspinal fusion intervertebral disc space replacement
without evidence of hardware failure or loosening. Stigmata of dish
within the lower thoracic spine. Old/healed moderate (approximately
30%) compression deformity involving the superior endplate of L1
without associated fracture line or paraspinal hematoma, similar to
lumbar spine CT performed 01/05/2020
IMPRESSION: 1. Findings worrisome for septic arthritis involving the bilateral
hips with marked progression of bilateral hip degenerative change
and development of large bilateral complex fluid collections which
likely communicate with the bilateral hip joint spaces. While
incompletely imaged, the pelvic extension of the right-sided
periarticular fluid collection was not seen lumbar spine CT
performed [DATE]. Emergent orthopedic consultation is advised.
2. Dominant right-sided at least 9.7 cm periarticular fluid
collection results in mass effect upon the right common femoral vein
however the pelvic venous systems and IVC appear widely patent.
3. Post L2-L4 paraspinal fusion and intervertebral disc space
replacement without evidence of hardware failure or loosening.
4. Moderate amount of atherosclerotic plaque within a normal caliber
abdominal aorta. Aortic Atherosclerosis ([id number]).
5. Incidentally noted fusiform ectasia of the bilateral common iliac
arteries, the right measuring 2.2 cm, the left measuring 2.0 cm.
6. Coronary calcifications.

Critical Value/emergent results were called by telephone at the time
of interpretation on 06/20/2020 at [DATE] to provider SHIKHO
LISHA , who verbally acknowledged these results.

## 2022-04-09 DIAGNOSIS — G4733 Obstructive sleep apnea (adult) (pediatric): Secondary | ICD-10-CM | POA: Diagnosis not present

## 2022-04-23 IMAGING — DX DG PORTABLE PELVIS
1 series · 1 of 1 positions shown · non-contrast
Comparison: Pelvis radiograph 05/03/2020

CLINICAL DATA: Right hip arthroplasty.

EXAM:
PORTABLE PELVIS 1-2 VIEWS

[pelvis ap]
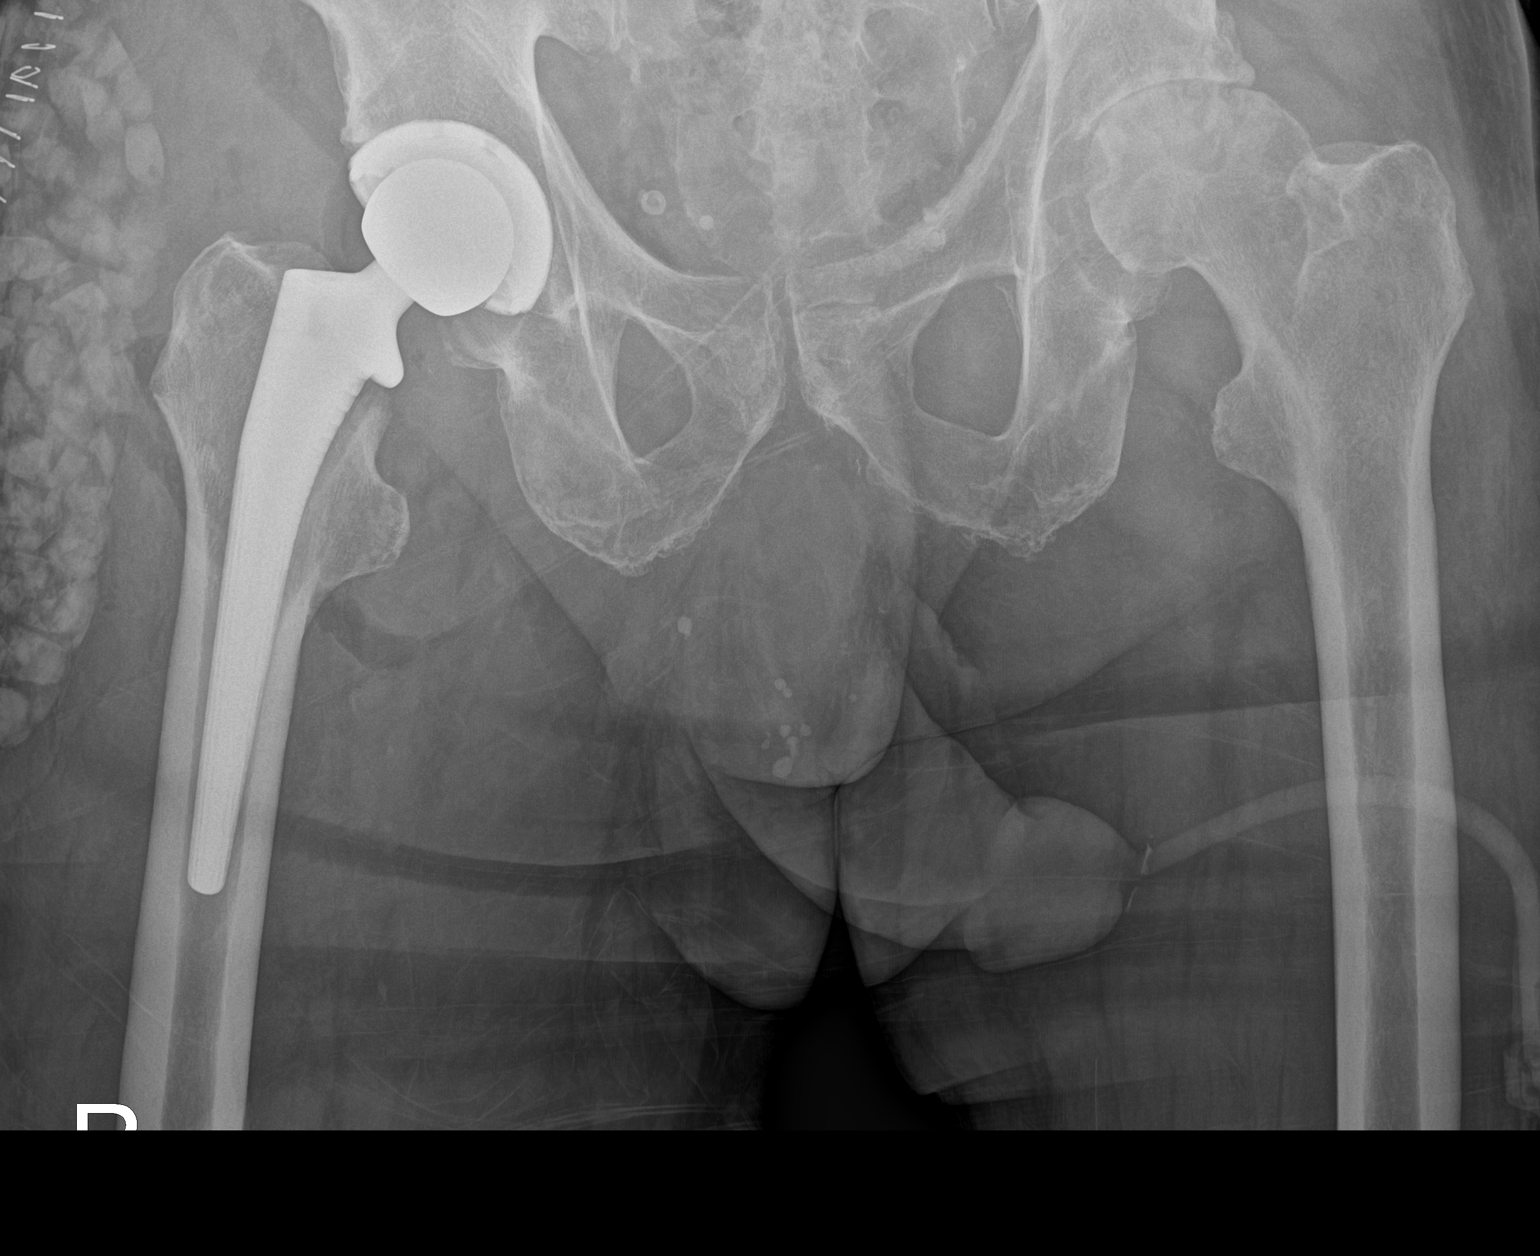

[1 of 1 positions shown; findings below may reference images not displayed]

FINDINGS: Right hip arthroplasty in expected alignment. No periprosthetic
lucency or fracture. Femoral stem is midline. Pubic rami are intact.
Joint space narrowing and subchondral cystic changes in the left
femoral head with slight flattening, unchanged from recent imaging.
Recent postsurgical change includes air and edema in the soft
tissues with lateral skin staples.
IMPRESSION: Right hip arthroplasty without immediate postoperative complication.

## 2022-04-29 ENCOUNTER — Ambulatory Visit (INDEPENDENT_AMBULATORY_CARE_PROVIDER_SITE_OTHER): Payer: PPO

## 2022-04-29 ENCOUNTER — Ambulatory Visit: Payer: PPO | Admitting: Physician Assistant

## 2022-04-29 DIAGNOSIS — M545 Low back pain, unspecified: Secondary | ICD-10-CM

## 2022-04-29 MED ORDER — METHOCARBAMOL 500 MG PO TABS: 500.0000 mg | ORAL_TABLET | Freq: Three times a day (TID) | ORAL | 0 refills | Status: DC | PRN

## 2022-04-29 NOTE — Progress Notes (Signed)
Office Visit Note   Patient: Juan Hudson           Date of Birth: 05-09-1946           MRN: [id number] Visit Date: 04/29/2022              Requested by: Crist Infante, MD [address] [place],  [place] [address] PCP: Crist Infante, MD  Chief Complaint  Patient presents with   Lower Back - Pain      HPI: Patient is a pleasant 76 year old gentleman who is status post bilateral hip replacements with Dr. Ninfa Linden last year.  He is also status post lumbar decompression fusion L2-4 with Dr. Ellene Route in 2021.  He denies any specific injuries but admits that on Friday he was working where he is having a new house built and was using a shovel.  He did not have any pain at the time but starting Saturday began having what he describes as spasms when he turns a certain way.  Pain is focal to the lower back.  Denies any paresthesias, any loss of bowel or bladder control, any weakness, any pain in his groin.  Denies any fever or chills.  Denies any abdominal pain.  He is using his walker as a precautionary measure.  He has had and used muscle relaxants when he has had something similar in the past but did not have any.  He is currently taking Aleve.  He is also tried Mobic.  Assessment & Plan: Visit Diagnoses:  1. Low back pain, unspecified back pain laterality, unspecified chronicity, unspecified whether sciatica present     Plan: Patient has had previous lumbar fusion with Dr. Ellene Route.  I do think that some of this was spasm caused by his shoveling on Friday.  He is neurologically intact.  He does have arthritis in the levels above his fusion.  I cannot appreciate any compromise in the hardware.  I discussed with this patient I do not think any of this is coming from his hips has he has no pain with manipulation of the hips and his pain is not in the groin.  I will give him some Robaxin.  He understands that this may be a little sedating and is not to take it unless he knows he is going to be home and  to be careful.  He can take Aleve or Mobic but should not take both.  He understands this is not my area of expertise.  I have advised him that he should follow-up with Dr. Ellene Route for his back just to make sure everything is okay.  If he has any development of neurological changes and we have reviewed these changes he is to go to the emergency room or call us immediately  Follow-Up Instructions: No follow-ups on file.   Ortho Exam  Patient is alert, oriented, no adenopathy, well-dressed, normal affect, normal respiratory effort. Examination of her lower back well-healed surgical incision no erythema no redness no fluctuance.  He has no tenderness to palpation today.  When he twists his back a little bit it does reproduce his spasm.  His lower extremity strength is 5 out of 5 with resisted dorsiflexion and plantarflexion of his ankles extension and flexion of his knees he has negative straight leg raise sensation is intact he has no tenderness to palpation in the groin.  He has no tenderness with internal/external rotation of both of his hips.  Imaging: XR Lumbar Spine 2-3 Views  Result Date: 04/29/2022  2 view x-rays of his lumbar spine were reviewed today.  He has surgical changes consistent with his previous surgery in 2021 of L2-4 fusion decompression.  Cannot see any compromise of hardware.  He does have significant degenerative changes of L1 T12.  Cannot appreciate any acute fractures.  He does have calcification of the aortic aorta which was mentioned on previous x-rays  No images are attached to the encounter.  Labs: No results found for: "HGBA1C", "ESRSEDRATE", "CRP", "LABURIC", "REPTSTATUS", "GRAMSTAIN", "CULT", "LABORGA"   Lab Results  Component Value Date   ALBUMIN 3.1 (L) 10/03/2020    No results found for: "MG" No results found for: "VD25OH"  No results found for: "PREALBUMIN"    Latest Ref Rng & Units 10/03/2020   11:08 AM 09/30/2020    3:04 AM 09/25/2020   11:38 AM  CBC  EXTENDED  WBC 4.0 - 10.5 K/uL 6.5  8.9  6.6   RBC 4.22 - 5.81 MIL/uL 4.30  4.21  4.85   Hemoglobin 13.0 - 17.0 g/dL 12.3  12.1  13.8   HCT 39.0 - 52.0 % 38.3  37.9  43.4   Platelets 150 - 400 K/uL 191  154  213      There is no height or weight on file to calculate BMI.  Orders:  Orders Placed This Encounter  Procedures   XR Lumbar Spine 2-3 Views   Meds ordered this encounter  Medications   methocarbamol (ROBAXIN) 500 MG tablet    Sig: Take 1 tablet (500 mg total) by mouth every 8 (eight) hours as needed for muscle spasms.    Dispense:  20 tablet    Refill:  0     Procedures: No procedures performed  Clinical Data: No additional findings.  ROS:  All other systems negative, except as noted in the HPI. Review of Systems  Objective: Vital Signs: There were no vitals taken for this visit.  Specialty Comments:  No specialty comments available.  PMFS History: Patient Active Problem List   Diagnosis Date Noted   Bilateral foot pain 09/07/2021   AMS (altered mental status) 10/03/2020   Status post total replacement of right hip 07/08/2020   Status post total replacement of left hip 07/07/2020   Unilateral primary osteoarthritis, left hip 05/15/2020   Unilateral primary osteoarthritis, right hip 05/15/2020   Lumbar stenosis with neurogenic claudication 01/11/2020   Subacute bronchitis 02/23/2019   Left lumbar radiculopathy 16/04/9603   Systolic murmur 54/03/8118   Hypersomnia 10/12/2015   Obstructive sleep apnea 02/15/2011   Foot pain 01/16/2011   Metatarsalgia of right foot 11/15/2010   Loss of transverse plantar arch 11/15/2010   Past Medical History:  Diagnosis Date   Allergy    Arthritis    Cancer ([facility])    skin cancer   Cataract    early   History of kidney stones    HLD (hyperlipidemia)    Hypertension    PONV (postoperative nausea and vomiting)    Rosacea    Sleep apnea    cpap   Systolic murmur     Family History  Problem Relation Age of  Onset   Heart attack Father        pacemaker   Dementia Mother    Colon cancer Neg Hx    Rectal cancer Neg Hx    Stomach cancer Neg Hx     Past Surgical History:  Procedure Laterality Date   ABDOMINAL HERNIA REPAIR     x2   ANTERIOR LAT  LUMBAR FUSION N/A 01/11/2020   Procedure: Lumbar Two-three Lumbar Three-Four Anterolateral decompression/fusion;  Surgeon: Kristeen Miss, MD;  Location: [facility];  Service: Neurosurgery;  Laterality: N/A;  anterolateral   APPLICATION OF ROBOTIC ASSISTANCE FOR SPINAL PROCEDURE N/A 01/11/2020   Procedure: APPLICATION OF ROBOTIC ASSISTANCE FOR SPINAL PROCEDURE;  Surgeon: Kristeen Miss, MD;  Location: [facility];  Service: Neurosurgery;  Laterality: N/A;  posterior   COLONOSCOPY  04-01-2003   tics and hems   HEMORRHOID SURGERY     INGUINAL HERNIA REPAIR     LUMBAR PERCUTANEOUS PEDICLE SCREW 2 LEVEL N/A 01/11/2020   Procedure: Percutaneous pedicle screw fixation from Lumbar Two to Lumbar Four;  Surgeon: Kristeen Miss, MD;  Location: [facility];  Service: Neurosurgery;  Laterality: N/A;  posterior   NOSE SURGERY     TONSILLECTOMY     TOTAL HIP ARTHROPLASTY Right 07/07/2020   Procedure: RIGHT TOTAL HIP ARTHROPLASTY ANTERIOR APPROACH;  Surgeon: Mcarthur Rossetti, MD;  Location: [facility];  Service: Orthopedics;  Laterality: Right;   TOTAL HIP ARTHROPLASTY Left 09/29/2020   Procedure: LEFT TOTAL HIP ARTHROPLASTY ANTERIOR APPROACH;  Surgeon: Mcarthur Rossetti, MD;  Location: [facility];  Service: Orthopedics;  Laterality: Left;   uvuloplasty     Social History   Occupational History   Occupation: Forensic scientist: OTHER  Tobacco Use   Smoking status: Former    Types: Cigarettes    Quit date: 07/22/1964    Years since quitting: 57.8   Smokeless tobacco: Never   Tobacco comments:    Reports smoking socially in high school    Verified by [facility] 04/04/2022  Vaping Use   Vaping Use: Never used  Substance and Sexual Activity   Alcohol use: Yes    Alcohol/week: 4.0  standard drinks of alcohol    Types: 4 Standard drinks or equivalent per week    Comment: occasionally   Drug use: No   Sexual activity: Not on file

## 2022-04-30 DIAGNOSIS — M47816 Spondylosis without myelopathy or radiculopathy, lumbar region: Secondary | ICD-10-CM | POA: Diagnosis not present

## 2022-05-03 DIAGNOSIS — R7989 Other specified abnormal findings of blood chemistry: Secondary | ICD-10-CM | POA: Diagnosis not present

## 2022-05-03 DIAGNOSIS — E291 Testicular hypofunction: Secondary | ICD-10-CM | POA: Diagnosis not present

## 2022-05-03 DIAGNOSIS — E785 Hyperlipidemia, unspecified: Secondary | ICD-10-CM | POA: Diagnosis not present

## 2022-05-03 DIAGNOSIS — I10 Essential (primary) hypertension: Secondary | ICD-10-CM | POA: Diagnosis not present

## 2022-05-07 DIAGNOSIS — C44529 Squamous cell carcinoma of skin of other part of trunk: Secondary | ICD-10-CM | POA: Diagnosis not present

## 2022-05-07 DIAGNOSIS — D225 Melanocytic nevi of trunk: Secondary | ICD-10-CM | POA: Diagnosis not present

## 2022-05-09 DIAGNOSIS — G4733 Obstructive sleep apnea (adult) (pediatric): Secondary | ICD-10-CM | POA: Diagnosis not present

## 2022-05-14 ENCOUNTER — Other Ambulatory Visit: Payer: Self-pay | Admitting: Physician Assistant

## 2022-06-09 DIAGNOSIS — G4733 Obstructive sleep apnea (adult) (pediatric): Secondary | ICD-10-CM | POA: Diagnosis not present

## 2022-06-10 DIAGNOSIS — D225 Melanocytic nevi of trunk: Secondary | ICD-10-CM | POA: Diagnosis not present

## 2022-06-10 DIAGNOSIS — L578 Other skin changes due to chronic exposure to nonionizing radiation: Secondary | ICD-10-CM | POA: Diagnosis not present

## 2022-06-10 DIAGNOSIS — L570 Actinic keratosis: Secondary | ICD-10-CM | POA: Diagnosis not present

## 2022-06-10 DIAGNOSIS — Z85828 Personal history of other malignant neoplasm of skin: Secondary | ICD-10-CM | POA: Diagnosis not present

## 2022-06-10 DIAGNOSIS — D485 Neoplasm of uncertain behavior of skin: Secondary | ICD-10-CM | POA: Diagnosis not present

## 2022-06-10 DIAGNOSIS — L821 Other seborrheic keratosis: Secondary | ICD-10-CM | POA: Diagnosis not present

## 2022-06-10 DIAGNOSIS — L820 Inflamed seborrheic keratosis: Secondary | ICD-10-CM | POA: Diagnosis not present

## 2022-06-10 DIAGNOSIS — D0439 Carcinoma in situ of skin of other parts of face: Secondary | ICD-10-CM | POA: Diagnosis not present

## 2022-06-11 NOTE — Progress Notes (Signed)
Per [employer], no prior auth required for code L3030 if DME is less than $500.  Ref# [id number]

## 2022-06-18 ENCOUNTER — Encounter: Payer: PPO | Admitting: Sports Medicine

## 2022-06-27 DIAGNOSIS — C44319 Basal cell carcinoma of skin of other parts of face: Secondary | ICD-10-CM | POA: Diagnosis not present

## 2022-07-09 DIAGNOSIS — G4733 Obstructive sleep apnea (adult) (pediatric): Secondary | ICD-10-CM | POA: Diagnosis not present

## 2022-07-11 DIAGNOSIS — D485 Neoplasm of uncertain behavior of skin: Secondary | ICD-10-CM | POA: Diagnosis not present

## 2022-07-11 DIAGNOSIS — C44529 Squamous cell carcinoma of skin of other part of trunk: Secondary | ICD-10-CM | POA: Diagnosis not present

## 2022-07-11 DIAGNOSIS — L570 Actinic keratosis: Secondary | ICD-10-CM | POA: Diagnosis not present

## 2022-07-22 DIAGNOSIS — G4733 Obstructive sleep apnea (adult) (pediatric): Secondary | ICD-10-CM | POA: Diagnosis not present

## 2022-07-31 DIAGNOSIS — Z5189 Encounter for other specified aftercare: Secondary | ICD-10-CM | POA: Diagnosis not present

## 2022-07-31 DIAGNOSIS — Z85828 Personal history of other malignant neoplasm of skin: Secondary | ICD-10-CM | POA: Diagnosis not present

## 2022-08-09 DIAGNOSIS — G4733 Obstructive sleep apnea (adult) (pediatric): Secondary | ICD-10-CM | POA: Diagnosis not present

## 2022-08-14 DIAGNOSIS — C4492 Squamous cell carcinoma of skin, unspecified: Secondary | ICD-10-CM | POA: Diagnosis not present

## 2022-08-14 DIAGNOSIS — C44529 Squamous cell carcinoma of skin of other part of trunk: Secondary | ICD-10-CM | POA: Diagnosis not present

## 2022-08-23 DIAGNOSIS — Z79899 Other long term (current) drug therapy: Secondary | ICD-10-CM | POA: Diagnosis not present

## 2022-08-23 DIAGNOSIS — E291 Testicular hypofunction: Secondary | ICD-10-CM | POA: Diagnosis not present

## 2022-08-23 DIAGNOSIS — E785 Hyperlipidemia, unspecified: Secondary | ICD-10-CM | POA: Diagnosis not present

## 2022-08-23 DIAGNOSIS — I10 Essential (primary) hypertension: Secondary | ICD-10-CM | POA: Diagnosis not present

## 2022-08-27 DIAGNOSIS — J029 Acute pharyngitis, unspecified: Secondary | ICD-10-CM | POA: Diagnosis not present

## 2022-08-27 DIAGNOSIS — U071 COVID-19: Secondary | ICD-10-CM | POA: Diagnosis not present

## 2022-08-27 DIAGNOSIS — R4181 Age-related cognitive decline: Secondary | ICD-10-CM | POA: Diagnosis not present

## 2022-08-27 DIAGNOSIS — E785 Hyperlipidemia, unspecified: Secondary | ICD-10-CM | POA: Diagnosis not present

## 2022-08-27 DIAGNOSIS — N3281 Overactive bladder: Secondary | ICD-10-CM | POA: Diagnosis not present

## 2022-08-27 DIAGNOSIS — R6883 Chills (without fever): Secondary | ICD-10-CM | POA: Diagnosis not present

## 2022-08-27 DIAGNOSIS — I2510 Atherosclerotic heart disease of native coronary artery without angina pectoris: Secondary | ICD-10-CM | POA: Diagnosis not present

## 2022-08-27 DIAGNOSIS — R059 Cough, unspecified: Secondary | ICD-10-CM | POA: Diagnosis not present

## 2022-08-27 DIAGNOSIS — I10 Essential (primary) hypertension: Secondary | ICD-10-CM | POA: Diagnosis not present

## 2022-08-27 DIAGNOSIS — Z1152 Encounter for screening for COVID-19: Secondary | ICD-10-CM | POA: Diagnosis not present

## 2022-08-27 DIAGNOSIS — G4733 Obstructive sleep apnea (adult) (pediatric): Secondary | ICD-10-CM | POA: Diagnosis not present

## 2022-08-27 DIAGNOSIS — I350 Nonrheumatic aortic (valve) stenosis: Secondary | ICD-10-CM | POA: Diagnosis not present

## 2022-08-30 DIAGNOSIS — R948 Abnormal results of function studies of other organs and systems: Secondary | ICD-10-CM | POA: Diagnosis not present

## 2022-08-30 DIAGNOSIS — E291 Testicular hypofunction: Secondary | ICD-10-CM | POA: Diagnosis not present

## 2022-09-06 DIAGNOSIS — N401 Enlarged prostate with lower urinary tract symptoms: Secondary | ICD-10-CM | POA: Diagnosis not present

## 2022-09-06 DIAGNOSIS — N5201 Erectile dysfunction due to arterial insufficiency: Secondary | ICD-10-CM | POA: Diagnosis not present

## 2022-09-06 DIAGNOSIS — R3915 Urgency of urination: Secondary | ICD-10-CM | POA: Diagnosis not present

## 2022-09-06 DIAGNOSIS — R351 Nocturia: Secondary | ICD-10-CM | POA: Diagnosis not present

## 2022-09-09 DIAGNOSIS — G4733 Obstructive sleep apnea (adult) (pediatric): Secondary | ICD-10-CM | POA: Diagnosis not present

## 2022-09-18 DIAGNOSIS — D0461 Carcinoma in situ of skin of right upper limb, including shoulder: Secondary | ICD-10-CM | POA: Diagnosis not present

## 2022-09-18 DIAGNOSIS — L821 Other seborrheic keratosis: Secondary | ICD-10-CM | POA: Diagnosis not present

## 2022-09-18 DIAGNOSIS — L570 Actinic keratosis: Secondary | ICD-10-CM | POA: Diagnosis not present

## 2022-09-18 DIAGNOSIS — Z85828 Personal history of other malignant neoplasm of skin: Secondary | ICD-10-CM | POA: Diagnosis not present

## 2022-09-18 DIAGNOSIS — D225 Melanocytic nevi of trunk: Secondary | ICD-10-CM | POA: Diagnosis not present

## 2022-09-18 DIAGNOSIS — L578 Other skin changes due to chronic exposure to nonionizing radiation: Secondary | ICD-10-CM | POA: Diagnosis not present

## 2022-09-18 DIAGNOSIS — D0439 Carcinoma in situ of skin of other parts of face: Secondary | ICD-10-CM | POA: Diagnosis not present

## 2022-09-18 DIAGNOSIS — D044 Carcinoma in situ of skin of scalp and neck: Secondary | ICD-10-CM | POA: Diagnosis not present

## 2022-09-18 DIAGNOSIS — D485 Neoplasm of uncertain behavior of skin: Secondary | ICD-10-CM | POA: Diagnosis not present

## 2022-09-25 DIAGNOSIS — H43813 Vitreous degeneration, bilateral: Secondary | ICD-10-CM | POA: Diagnosis not present

## 2022-09-25 DIAGNOSIS — H0100A Unspecified blepharitis right eye, upper and lower eyelids: Secondary | ICD-10-CM | POA: Diagnosis not present

## 2022-09-25 DIAGNOSIS — Z961 Presence of intraocular lens: Secondary | ICD-10-CM | POA: Diagnosis not present

## 2022-09-25 DIAGNOSIS — H5203 Hypermetropia, bilateral: Secondary | ICD-10-CM | POA: Diagnosis not present

## 2022-09-25 DIAGNOSIS — H524 Presbyopia: Secondary | ICD-10-CM | POA: Diagnosis not present

## 2022-10-08 DIAGNOSIS — G4733 Obstructive sleep apnea (adult) (pediatric): Secondary | ICD-10-CM | POA: Diagnosis not present

## 2022-10-09 DIAGNOSIS — C411 Malignant neoplasm of mandible: Secondary | ICD-10-CM | POA: Diagnosis not present

## 2022-10-30 DIAGNOSIS — D044 Carcinoma in situ of skin of scalp and neck: Secondary | ICD-10-CM | POA: Diagnosis not present

## 2022-11-08 DIAGNOSIS — G4733 Obstructive sleep apnea (adult) (pediatric): Secondary | ICD-10-CM | POA: Diagnosis not present

## 2022-11-13 DIAGNOSIS — L905 Scar conditions and fibrosis of skin: Secondary | ICD-10-CM | POA: Diagnosis not present

## 2022-11-13 DIAGNOSIS — D239 Other benign neoplasm of skin, unspecified: Secondary | ICD-10-CM | POA: Diagnosis not present

## 2022-11-27 DIAGNOSIS — L570 Actinic keratosis: Secondary | ICD-10-CM | POA: Diagnosis not present

## 2022-11-27 DIAGNOSIS — C44622 Squamous cell carcinoma of skin of right upper limb, including shoulder: Secondary | ICD-10-CM | POA: Diagnosis not present

## 2022-11-27 DIAGNOSIS — L905 Scar conditions and fibrosis of skin: Secondary | ICD-10-CM | POA: Diagnosis not present

## 2022-11-27 DIAGNOSIS — D485 Neoplasm of uncertain behavior of skin: Secondary | ICD-10-CM | POA: Diagnosis not present

## 2022-12-08 DIAGNOSIS — G4733 Obstructive sleep apnea (adult) (pediatric): Secondary | ICD-10-CM | POA: Diagnosis not present

## 2022-12-18 DIAGNOSIS — C44619 Basal cell carcinoma of skin of left upper limb, including shoulder: Secondary | ICD-10-CM | POA: Diagnosis not present

## 2022-12-18 DIAGNOSIS — D485 Neoplasm of uncertain behavior of skin: Secondary | ICD-10-CM | POA: Diagnosis not present

## 2022-12-18 DIAGNOSIS — L821 Other seborrheic keratosis: Secondary | ICD-10-CM | POA: Diagnosis not present

## 2022-12-18 DIAGNOSIS — Z85828 Personal history of other malignant neoplasm of skin: Secondary | ICD-10-CM | POA: Diagnosis not present

## 2022-12-18 DIAGNOSIS — C4441 Basal cell carcinoma of skin of scalp and neck: Secondary | ICD-10-CM | POA: Diagnosis not present

## 2022-12-18 DIAGNOSIS — L578 Other skin changes due to chronic exposure to nonionizing radiation: Secondary | ICD-10-CM | POA: Diagnosis not present

## 2022-12-18 DIAGNOSIS — L720 Epidermal cyst: Secondary | ICD-10-CM | POA: Diagnosis not present

## 2022-12-18 DIAGNOSIS — L570 Actinic keratosis: Secondary | ICD-10-CM | POA: Diagnosis not present

## 2022-12-18 DIAGNOSIS — D225 Melanocytic nevi of trunk: Secondary | ICD-10-CM | POA: Diagnosis not present

## 2023-01-08 DIAGNOSIS — G4733 Obstructive sleep apnea (adult) (pediatric): Secondary | ICD-10-CM | POA: Diagnosis not present

## 2023-01-20 ENCOUNTER — Ambulatory Visit: Payer: PPO | Admitting: Internal Medicine

## 2023-01-30 DIAGNOSIS — G4733 Obstructive sleep apnea (adult) (pediatric): Secondary | ICD-10-CM | POA: Diagnosis not present

## 2023-02-04 DIAGNOSIS — C4441 Basal cell carcinoma of skin of scalp and neck: Secondary | ICD-10-CM | POA: Diagnosis not present

## 2023-02-05 DIAGNOSIS — G4733 Obstructive sleep apnea (adult) (pediatric): Secondary | ICD-10-CM | POA: Diagnosis not present

## 2023-02-07 DIAGNOSIS — G4733 Obstructive sleep apnea (adult) (pediatric): Secondary | ICD-10-CM | POA: Diagnosis not present

## 2023-02-18 DIAGNOSIS — C44619 Basal cell carcinoma of skin of left upper limb, including shoulder: Secondary | ICD-10-CM | POA: Diagnosis not present

## 2023-02-18 DIAGNOSIS — L988 Other specified disorders of the skin and subcutaneous tissue: Secondary | ICD-10-CM | POA: Diagnosis not present

## 2023-02-26 DIAGNOSIS — I2510 Atherosclerotic heart disease of native coronary artery without angina pectoris: Secondary | ICD-10-CM | POA: Diagnosis not present

## 2023-02-26 DIAGNOSIS — E785 Hyperlipidemia, unspecified: Secondary | ICD-10-CM | POA: Diagnosis not present

## 2023-02-26 DIAGNOSIS — Z1212 Encounter for screening for malignant neoplasm of rectum: Secondary | ICD-10-CM | POA: Diagnosis not present

## 2023-02-26 DIAGNOSIS — I10 Essential (primary) hypertension: Secondary | ICD-10-CM | POA: Diagnosis not present

## 2023-02-26 DIAGNOSIS — Z125 Encounter for screening for malignant neoplasm of prostate: Secondary | ICD-10-CM | POA: Diagnosis not present

## 2023-02-26 DIAGNOSIS — E291 Testicular hypofunction: Secondary | ICD-10-CM | POA: Diagnosis not present

## 2023-02-26 DIAGNOSIS — R82998 Other abnormal findings in urine: Secondary | ICD-10-CM | POA: Diagnosis not present

## 2023-03-04 DIAGNOSIS — L720 Epidermal cyst: Secondary | ICD-10-CM | POA: Diagnosis not present

## 2023-03-04 DIAGNOSIS — D485 Neoplasm of uncertain behavior of skin: Secondary | ICD-10-CM | POA: Diagnosis not present

## 2023-03-05 DIAGNOSIS — G4733 Obstructive sleep apnea (adult) (pediatric): Secondary | ICD-10-CM | POA: Diagnosis not present

## 2023-03-05 DIAGNOSIS — R4181 Age-related cognitive decline: Secondary | ICD-10-CM | POA: Diagnosis not present

## 2023-03-05 DIAGNOSIS — E785 Hyperlipidemia, unspecified: Secondary | ICD-10-CM | POA: Diagnosis not present

## 2023-03-05 DIAGNOSIS — R413 Other amnesia: Secondary | ICD-10-CM | POA: Diagnosis not present

## 2023-03-05 DIAGNOSIS — Z0000 Encounter for general adult medical examination without abnormal findings: Secondary | ICD-10-CM | POA: Diagnosis not present

## 2023-03-05 DIAGNOSIS — M5416 Radiculopathy, lumbar region: Secondary | ICD-10-CM | POA: Diagnosis not present

## 2023-03-05 DIAGNOSIS — I2510 Atherosclerotic heart disease of native coronary artery without angina pectoris: Secondary | ICD-10-CM | POA: Diagnosis not present

## 2023-03-05 DIAGNOSIS — H9190 Unspecified hearing loss, unspecified ear: Secondary | ICD-10-CM | POA: Diagnosis not present

## 2023-03-05 DIAGNOSIS — R2689 Other abnormalities of gait and mobility: Secondary | ICD-10-CM | POA: Diagnosis not present

## 2023-03-05 DIAGNOSIS — I351 Nonrheumatic aortic (valve) insufficiency: Secondary | ICD-10-CM | POA: Diagnosis not present

## 2023-03-05 DIAGNOSIS — Z79899 Other long term (current) drug therapy: Secondary | ICD-10-CM | POA: Diagnosis not present

## 2023-03-05 DIAGNOSIS — I10 Essential (primary) hypertension: Secondary | ICD-10-CM | POA: Diagnosis not present

## 2023-03-26 DIAGNOSIS — D485 Neoplasm of uncertain behavior of skin: Secondary | ICD-10-CM | POA: Diagnosis not present

## 2023-03-26 DIAGNOSIS — L570 Actinic keratosis: Secondary | ICD-10-CM | POA: Diagnosis not present

## 2023-03-26 DIAGNOSIS — L578 Other skin changes due to chronic exposure to nonionizing radiation: Secondary | ICD-10-CM | POA: Diagnosis not present

## 2023-03-26 DIAGNOSIS — D0461 Carcinoma in situ of skin of right upper limb, including shoulder: Secondary | ICD-10-CM | POA: Diagnosis not present

## 2023-03-26 DIAGNOSIS — Z85828 Personal history of other malignant neoplasm of skin: Secondary | ICD-10-CM | POA: Diagnosis not present

## 2023-03-26 DIAGNOSIS — L720 Epidermal cyst: Secondary | ICD-10-CM | POA: Diagnosis not present

## 2023-03-26 DIAGNOSIS — L821 Other seborrheic keratosis: Secondary | ICD-10-CM | POA: Diagnosis not present

## 2023-03-26 DIAGNOSIS — D044 Carcinoma in situ of skin of scalp and neck: Secondary | ICD-10-CM | POA: Diagnosis not present

## 2023-03-26 DIAGNOSIS — D225 Melanocytic nevi of trunk: Secondary | ICD-10-CM | POA: Diagnosis not present

## 2023-03-26 DIAGNOSIS — C44529 Squamous cell carcinoma of skin of other part of trunk: Secondary | ICD-10-CM | POA: Diagnosis not present

## 2023-04-05 NOTE — Progress Notes (Unsigned)
Subjective:    Patient ID: Juan Hudson, male    DOB: 20-Sep-1945, 77 y.o.   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  He had UPPP surgery and septoplasty by Dr Annalee Genta, but unsuccessfull.  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 He thinks the pressure may be too high. Getting approx 7-8hr/night.  This machine only a few weeks old. He doesn't think it is giving full credit for time slept and has discussed with DME. Our download shows 6 hours 51 minutes. Small nasal mask seems to fit ok. We will turn pressure down and see if that helps.  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 Download reviewed. Still moderate leak. He says he is comfortable with his current settings and mask.  Insurance changed him to [place] last year.  Download reviewed. He had flu and RSV vaccines 2 weeks ago, well-tolerated.  ROS-see HPI   + = positive Constitutional:    weight loss, night sweats, fevers, chills, fatigue, lassitude. HEENT:    headaches, difficulty swallowing, tooth/dental problems, sore  throat,       sneezing, itching, ear ache, nasal congestion, post nasal drip, snoring CV:    chest pain, orthopnea, PND, swelling in lower extremities, anasarca,                          dizziness, palpitations Resp:   shortness of breath with exertion or at rest.                +productive cough,   non-productive cough, coughing up of blood.              change in color of mucus.  wheezing.   Skin:    rash or lesions. GI:  No-   heartburn, indigestion, abdominal pain, nausea, vomiting, diarrhea,                 change in bowel habits, loss of appetite GU: dysuria, change in color of urine, no urgency or frequency.   flank pain. MS:   joint pain, stiffness, decreased range of motion, back pain. Neuro-     nothing unusual Psych:  change in mood or affect.  depression or anxiety.   memory loss.    Objective:   Physical Exam General- Alert, Oriented, Affect-appropriate, Distress- none acute , healthy appearing Skin- no rash Lymphadenopathy- none Head- atraumatic            Eyes- Gross vision intact, PERRLA, conjunctivae clear secretions            Ears- Hearing, canals normal            Nose- Clear, No-Septal dev, mucus, polyps,  erosion, perforation             Throat- S/p UPPP , mucosa clear , drainage- none, tonsils- atrophic Neck- flexible , trachea midline, no stridor , thyroid nl, carotid no bruit Chest - symmetrical excursion , unlabored           Heart/CV- RRR ,  Murmur+2/6 systolic/ aortic , no gallop  , no rub, nl s1 s2                           - JVD- none , edema- none, stasis changes- none, varices- none           Lung- clear to P&A, wheeze- none, cough- none , dullness-none, rub- none           Chest wall-  Abd- tender-no, distended-no, bowel sounds-present, HSM- no Br/ Gen/ Rectal- Not done, not indicated Extrem- cyanosis- none, clubbing, none, atrophy- none, strength- nl Neuro- grossly intact to observation    Assessment & Plan:

## 2023-04-07 ENCOUNTER — Ambulatory Visit: Payer: PPO | Admitting: Internal Medicine

## 2023-04-07 ENCOUNTER — Encounter: Payer: Self-pay | Admitting: Internal Medicine

## 2023-04-07 VITALS — BP 128/64 | HR 78 | Temp 97.7°F | Ht 69.0 in | Wt 187.6 lb

## 2023-04-07 DIAGNOSIS — R011 Cardiac murmur, unspecified: Secondary | ICD-10-CM | POA: Diagnosis not present

## 2023-04-07 DIAGNOSIS — G4733 Obstructive sleep apnea (adult) (pediatric): Secondary | ICD-10-CM | POA: Diagnosis not present

## 2023-04-07 NOTE — Patient Instructions (Signed)
We can continue CPAP auto 5-15.  You can check with your insurance as to which DME company they want you to use. You can cancel the other one.  Please call if we can help

## 2023-04-08 ENCOUNTER — Encounter: Payer: Self-pay | Admitting: Internal Medicine

## 2023-04-08 NOTE — Assessment & Plan Note (Addendum)
Benefits from CPAP with good compliance and control Plan- continue auto 5-15

## 2023-04-08 NOTE — Assessment & Plan Note (Addendum)
No apparent change in obvious cardiac murmur Echo does not point to definite cause

## 2023-05-08 DIAGNOSIS — C4442 Squamous cell carcinoma of skin of scalp and neck: Secondary | ICD-10-CM | POA: Diagnosis not present

## 2023-05-18 DIAGNOSIS — G4733 Obstructive sleep apnea (adult) (pediatric): Secondary | ICD-10-CM | POA: Diagnosis not present

## 2023-05-22 DIAGNOSIS — D045 Carcinoma in situ of skin of trunk: Secondary | ICD-10-CM | POA: Diagnosis not present

## 2023-05-22 DIAGNOSIS — C44529 Squamous cell carcinoma of skin of other part of trunk: Secondary | ICD-10-CM | POA: Diagnosis not present

## 2023-06-16 DIAGNOSIS — D045 Carcinoma in situ of skin of trunk: Secondary | ICD-10-CM | POA: Diagnosis not present

## 2023-08-01 DIAGNOSIS — L570 Actinic keratosis: Secondary | ICD-10-CM | POA: Diagnosis not present

## 2023-08-01 DIAGNOSIS — D0439 Carcinoma in situ of skin of other parts of face: Secondary | ICD-10-CM | POA: Diagnosis not present

## 2023-08-01 DIAGNOSIS — L720 Epidermal cyst: Secondary | ICD-10-CM | POA: Diagnosis not present

## 2023-08-01 DIAGNOSIS — D485 Neoplasm of uncertain behavior of skin: Secondary | ICD-10-CM | POA: Diagnosis not present

## 2023-08-01 DIAGNOSIS — L578 Other skin changes due to chronic exposure to nonionizing radiation: Secondary | ICD-10-CM | POA: Diagnosis not present

## 2023-08-01 DIAGNOSIS — D045 Carcinoma in situ of skin of trunk: Secondary | ICD-10-CM | POA: Diagnosis not present

## 2023-08-01 DIAGNOSIS — D225 Melanocytic nevi of trunk: Secondary | ICD-10-CM | POA: Diagnosis not present

## 2023-08-01 DIAGNOSIS — Z85828 Personal history of other malignant neoplasm of skin: Secondary | ICD-10-CM | POA: Diagnosis not present

## 2023-08-01 DIAGNOSIS — L719 Rosacea, unspecified: Secondary | ICD-10-CM | POA: Diagnosis not present

## 2023-08-01 DIAGNOSIS — L821 Other seborrheic keratosis: Secondary | ICD-10-CM | POA: Diagnosis not present

## 2023-09-05 DIAGNOSIS — R948 Abnormal results of function studies of other organs and systems: Secondary | ICD-10-CM | POA: Diagnosis not present

## 2023-09-05 DIAGNOSIS — E291 Testicular hypofunction: Secondary | ICD-10-CM | POA: Diagnosis not present

## 2023-09-08 DIAGNOSIS — C44329 Squamous cell carcinoma of skin of other parts of face: Secondary | ICD-10-CM | POA: Diagnosis not present

## 2023-09-12 DIAGNOSIS — R350 Frequency of micturition: Secondary | ICD-10-CM | POA: Diagnosis not present

## 2023-09-12 DIAGNOSIS — R972 Elevated prostate specific antigen [PSA]: Secondary | ICD-10-CM | POA: Diagnosis not present

## 2023-09-12 DIAGNOSIS — N401 Enlarged prostate with lower urinary tract symptoms: Secondary | ICD-10-CM | POA: Diagnosis not present

## 2023-09-12 DIAGNOSIS — R351 Nocturia: Secondary | ICD-10-CM | POA: Diagnosis not present

## 2023-09-12 DIAGNOSIS — E349 Endocrine disorder, unspecified: Secondary | ICD-10-CM | POA: Diagnosis not present

## 2023-09-19 DIAGNOSIS — D045 Carcinoma in situ of skin of trunk: Secondary | ICD-10-CM | POA: Diagnosis not present

## 2023-09-24 ENCOUNTER — Other Ambulatory Visit: Payer: Self-pay

## 2023-09-24 ENCOUNTER — Emergency Department (HOSPITAL_COMMUNITY)
Admission: EM | Admit: 2023-09-24 | Discharge: 2023-09-24 | Discharge status: Against Medical Advice | Attending: Emergency Medicine | Admitting: Emergency Medicine

## 2023-09-24 DIAGNOSIS — I498 Other specified cardiac arrhythmias: Secondary | ICD-10-CM | POA: Diagnosis not present

## 2023-09-24 DIAGNOSIS — I10 Essential (primary) hypertension: Secondary | ICD-10-CM | POA: Diagnosis not present

## 2023-09-24 DIAGNOSIS — W19XXXA Unspecified fall, initial encounter: Secondary | ICD-10-CM | POA: Insufficient documentation

## 2023-09-24 DIAGNOSIS — Z5321 Procedure and treatment not carried out due to patient leaving prior to being seen by health care provider: Secondary | ICD-10-CM | POA: Diagnosis not present

## 2023-09-24 DIAGNOSIS — R42 Dizziness and giddiness: Secondary | ICD-10-CM | POA: Diagnosis not present

## 2023-09-24 NOTE — ED Notes (Signed)
 Pt decided to leave to go to [facility]

## 2023-09-24 NOTE — ED Triage Notes (Signed)
 Pt arrived via POV. C/o fall after having a dizzy spell. Pt did hit head on table leg, but has no c/o pain. No LOC, not on blood thinners. Pt states they are on several meds than can lower blood pressure.  Aox4

## 2023-09-26 DIAGNOSIS — I498 Other specified cardiac arrhythmias: Secondary | ICD-10-CM | POA: Diagnosis not present

## 2023-09-30 DIAGNOSIS — E785 Hyperlipidemia, unspecified: Secondary | ICD-10-CM | POA: Diagnosis not present

## 2023-09-30 DIAGNOSIS — M47812 Spondylosis without myelopathy or radiculopathy, cervical region: Secondary | ICD-10-CM | POA: Diagnosis not present

## 2023-09-30 DIAGNOSIS — H9190 Unspecified hearing loss, unspecified ear: Secondary | ICD-10-CM | POA: Diagnosis not present

## 2023-09-30 DIAGNOSIS — N3281 Overactive bladder: Secondary | ICD-10-CM | POA: Diagnosis not present

## 2023-09-30 DIAGNOSIS — I350 Nonrheumatic aortic (valve) stenosis: Secondary | ICD-10-CM | POA: Diagnosis not present

## 2023-09-30 DIAGNOSIS — E291 Testicular hypofunction: Secondary | ICD-10-CM | POA: Diagnosis not present

## 2023-09-30 DIAGNOSIS — G4733 Obstructive sleep apnea (adult) (pediatric): Secondary | ICD-10-CM | POA: Diagnosis not present

## 2023-09-30 DIAGNOSIS — I2510 Atherosclerotic heart disease of native coronary artery without angina pectoris: Secondary | ICD-10-CM | POA: Diagnosis not present

## 2023-09-30 DIAGNOSIS — R4181 Age-related cognitive decline: Secondary | ICD-10-CM | POA: Diagnosis not present

## 2023-09-30 DIAGNOSIS — K5792 Diverticulitis of intestine, part unspecified, without perforation or abscess without bleeding: Secondary | ICD-10-CM | POA: Diagnosis not present

## 2023-09-30 DIAGNOSIS — I351 Nonrheumatic aortic (valve) insufficiency: Secondary | ICD-10-CM | POA: Diagnosis not present

## 2023-09-30 DIAGNOSIS — R42 Dizziness and giddiness: Secondary | ICD-10-CM | POA: Diagnosis not present

## 2023-09-30 DIAGNOSIS — M5416 Radiculopathy, lumbar region: Secondary | ICD-10-CM | POA: Diagnosis not present

## 2023-09-30 DIAGNOSIS — I10 Essential (primary) hypertension: Secondary | ICD-10-CM | POA: Diagnosis not present

## 2023-10-08 DIAGNOSIS — D045 Carcinoma in situ of skin of trunk: Secondary | ICD-10-CM | POA: Diagnosis not present

## 2023-10-30 DIAGNOSIS — D044 Carcinoma in situ of skin of scalp and neck: Secondary | ICD-10-CM | POA: Diagnosis not present

## 2023-10-30 DIAGNOSIS — Z85828 Personal history of other malignant neoplasm of skin: Secondary | ICD-10-CM | POA: Diagnosis not present

## 2023-10-30 DIAGNOSIS — L570 Actinic keratosis: Secondary | ICD-10-CM | POA: Diagnosis not present

## 2023-10-30 DIAGNOSIS — D225 Melanocytic nevi of trunk: Secondary | ICD-10-CM | POA: Diagnosis not present

## 2023-10-30 DIAGNOSIS — L821 Other seborrheic keratosis: Secondary | ICD-10-CM | POA: Diagnosis not present

## 2023-10-30 DIAGNOSIS — L578 Other skin changes due to chronic exposure to nonionizing radiation: Secondary | ICD-10-CM | POA: Diagnosis not present

## 2023-10-30 DIAGNOSIS — D485 Neoplasm of uncertain behavior of skin: Secondary | ICD-10-CM | POA: Diagnosis not present

## 2023-11-03 DIAGNOSIS — R972 Elevated prostate specific antigen [PSA]: Secondary | ICD-10-CM | POA: Diagnosis not present

## 2023-11-06 ENCOUNTER — Other Ambulatory Visit: Payer: Self-pay | Admitting: Family Medicine

## 2023-11-06 DIAGNOSIS — Z006 Encounter for examination for normal comparison and control in clinical research program: Secondary | ICD-10-CM

## 2023-11-10 DIAGNOSIS — N401 Enlarged prostate with lower urinary tract symptoms: Secondary | ICD-10-CM | POA: Diagnosis not present

## 2023-11-10 DIAGNOSIS — R972 Elevated prostate specific antigen [PSA]: Secondary | ICD-10-CM | POA: Diagnosis not present

## 2023-11-10 DIAGNOSIS — R351 Nocturia: Secondary | ICD-10-CM | POA: Diagnosis not present

## 2023-11-10 DIAGNOSIS — E349 Endocrine disorder, unspecified: Secondary | ICD-10-CM | POA: Diagnosis not present

## 2023-11-12 DIAGNOSIS — R413 Other amnesia: Secondary | ICD-10-CM | POA: Diagnosis not present

## 2023-11-13 DIAGNOSIS — C4442 Squamous cell carcinoma of skin of scalp and neck: Secondary | ICD-10-CM | POA: Diagnosis not present

## 2023-11-21 ENCOUNTER — Other Ambulatory Visit

## 2023-11-24 ENCOUNTER — Other Ambulatory Visit

## 2023-12-02 DIAGNOSIS — R413 Other amnesia: Secondary | ICD-10-CM | POA: Diagnosis not present

## 2024-01-07 DIAGNOSIS — R419 Unspecified symptoms and signs involving cognitive functions and awareness: Secondary | ICD-10-CM | POA: Diagnosis not present

## 2024-02-10 DIAGNOSIS — L578 Other skin changes due to chronic exposure to nonionizing radiation: Secondary | ICD-10-CM | POA: Diagnosis not present

## 2024-02-10 DIAGNOSIS — Z85828 Personal history of other malignant neoplasm of skin: Secondary | ICD-10-CM | POA: Diagnosis not present

## 2024-02-10 DIAGNOSIS — Z86018 Personal history of other benign neoplasm: Secondary | ICD-10-CM | POA: Diagnosis not present

## 2024-02-10 DIAGNOSIS — L570 Actinic keratosis: Secondary | ICD-10-CM | POA: Diagnosis not present

## 2024-02-10 DIAGNOSIS — D225 Melanocytic nevi of trunk: Secondary | ICD-10-CM | POA: Diagnosis not present

## 2024-02-10 DIAGNOSIS — L814 Other melanin hyperpigmentation: Secondary | ICD-10-CM | POA: Diagnosis not present

## 2024-02-10 DIAGNOSIS — D485 Neoplasm of uncertain behavior of skin: Secondary | ICD-10-CM | POA: Diagnosis not present

## 2024-02-10 DIAGNOSIS — L821 Other seborrheic keratosis: Secondary | ICD-10-CM | POA: Diagnosis not present

## 2024-02-16 DIAGNOSIS — R3915 Urgency of urination: Secondary | ICD-10-CM | POA: Diagnosis not present

## 2024-02-16 DIAGNOSIS — N401 Enlarged prostate with lower urinary tract symptoms: Secondary | ICD-10-CM | POA: Diagnosis not present

## 2024-02-16 DIAGNOSIS — R972 Elevated prostate specific antigen [PSA]: Secondary | ICD-10-CM | POA: Diagnosis not present

## 2024-02-16 DIAGNOSIS — E349 Endocrine disorder, unspecified: Secondary | ICD-10-CM | POA: Diagnosis not present

## 2024-03-15 DIAGNOSIS — R419 Unspecified symptoms and signs involving cognitive functions and awareness: Secondary | ICD-10-CM | POA: Diagnosis not present

## 2024-05-05 ENCOUNTER — Other Ambulatory Visit (HOSPITAL_COMMUNITY): Payer: Self-pay | Admitting: Registered Nurse

## 2024-05-05 DIAGNOSIS — M7989 Other specified soft tissue disorders: Secondary | ICD-10-CM

## 2024-05-05 DIAGNOSIS — I10 Essential (primary) hypertension: Secondary | ICD-10-CM | POA: Diagnosis not present

## 2024-05-05 DIAGNOSIS — W19XXXA Unspecified fall, initial encounter: Secondary | ICD-10-CM | POA: Diagnosis not present

## 2024-05-05 DIAGNOSIS — M25561 Pain in right knee: Secondary | ICD-10-CM

## 2024-05-05 DIAGNOSIS — I350 Nonrheumatic aortic (valve) stenosis: Secondary | ICD-10-CM | POA: Diagnosis not present

## 2024-05-05 DIAGNOSIS — R2689 Other abnormalities of gait and mobility: Secondary | ICD-10-CM | POA: Diagnosis not present

## 2024-05-05 DIAGNOSIS — Z23 Encounter for immunization: Secondary | ICD-10-CM | POA: Diagnosis not present

## 2024-05-05 DIAGNOSIS — I351 Nonrheumatic aortic (valve) insufficiency: Secondary | ICD-10-CM | POA: Diagnosis not present

## 2024-05-06 ENCOUNTER — Ambulatory Visit (HOSPITAL_COMMUNITY)
Admission: RE | Admit: 2024-05-06 | Discharge: 2024-05-06 | Disposition: A | Discharge status: Home / Self Care | Source: Ambulatory Visit | Attending: Vascular Surgery | Admitting: Vascular Surgery

## 2024-05-06 DIAGNOSIS — M25561 Pain in right knee: Secondary | ICD-10-CM | POA: Insufficient documentation

## 2024-05-06 DIAGNOSIS — M7989 Other specified soft tissue disorders: Secondary | ICD-10-CM | POA: Diagnosis not present

## 2024-05-10 DIAGNOSIS — E349 Endocrine disorder, unspecified: Secondary | ICD-10-CM | POA: Diagnosis not present

## 2024-05-14 DIAGNOSIS — E785 Hyperlipidemia, unspecified: Secondary | ICD-10-CM | POA: Diagnosis not present

## 2024-05-14 DIAGNOSIS — I10 Essential (primary) hypertension: Secondary | ICD-10-CM | POA: Diagnosis not present

## 2024-05-14 DIAGNOSIS — E291 Testicular hypofunction: Secondary | ICD-10-CM | POA: Diagnosis not present

## 2024-05-14 DIAGNOSIS — Z1212 Encounter for screening for malignant neoplasm of rectum: Secondary | ICD-10-CM | POA: Diagnosis not present

## 2024-05-17 DIAGNOSIS — R399 Unspecified symptoms and signs involving the genitourinary system: Secondary | ICD-10-CM | POA: Diagnosis not present

## 2024-05-17 DIAGNOSIS — N3281 Overactive bladder: Secondary | ICD-10-CM | POA: Diagnosis not present

## 2024-05-17 DIAGNOSIS — R350 Frequency of micturition: Secondary | ICD-10-CM | POA: Diagnosis not present

## 2024-05-17 DIAGNOSIS — R972 Elevated prostate specific antigen [PSA]: Secondary | ICD-10-CM | POA: Diagnosis not present

## 2024-05-17 DIAGNOSIS — E291 Testicular hypofunction: Secondary | ICD-10-CM | POA: Diagnosis not present

## 2024-05-17 DIAGNOSIS — R3915 Urgency of urination: Secondary | ICD-10-CM | POA: Diagnosis not present

## 2024-05-27 DIAGNOSIS — R3121 Asymptomatic microscopic hematuria: Secondary | ICD-10-CM | POA: Diagnosis not present

## 2024-05-27 DIAGNOSIS — E291 Testicular hypofunction: Secondary | ICD-10-CM | POA: Diagnosis not present

## 2024-05-28 DIAGNOSIS — M5416 Radiculopathy, lumbar region: Secondary | ICD-10-CM | POA: Diagnosis not present

## 2024-05-28 DIAGNOSIS — N3281 Overactive bladder: Secondary | ICD-10-CM | POA: Diagnosis not present

## 2024-05-28 DIAGNOSIS — I10 Essential (primary) hypertension: Secondary | ICD-10-CM | POA: Diagnosis not present

## 2024-05-28 DIAGNOSIS — G3184 Mild cognitive impairment, so stated: Secondary | ICD-10-CM | POA: Diagnosis not present

## 2024-05-28 DIAGNOSIS — Z0000 Encounter for general adult medical examination without abnormal findings: Secondary | ICD-10-CM | POA: Diagnosis not present

## 2024-05-28 DIAGNOSIS — G4733 Obstructive sleep apnea (adult) (pediatric): Secondary | ICD-10-CM | POA: Diagnosis not present

## 2024-05-28 DIAGNOSIS — R7989 Other specified abnormal findings of blood chemistry: Secondary | ICD-10-CM | POA: Diagnosis not present

## 2024-05-28 DIAGNOSIS — Z1331 Encounter for screening for depression: Secondary | ICD-10-CM | POA: Diagnosis not present

## 2024-05-28 DIAGNOSIS — I2510 Atherosclerotic heart disease of native coronary artery without angina pectoris: Secondary | ICD-10-CM | POA: Diagnosis not present

## 2024-05-28 DIAGNOSIS — E785 Hyperlipidemia, unspecified: Secondary | ICD-10-CM | POA: Diagnosis not present

## 2024-05-28 DIAGNOSIS — R2689 Other abnormalities of gait and mobility: Secondary | ICD-10-CM | POA: Diagnosis not present

## 2024-05-28 DIAGNOSIS — R251 Tremor, unspecified: Secondary | ICD-10-CM | POA: Diagnosis not present

## 2024-06-02 ENCOUNTER — Other Ambulatory Visit: Payer: Self-pay

## 2024-06-02 ENCOUNTER — Ambulatory Visit: Admitting: Orthopedic Surgery

## 2024-06-02 DIAGNOSIS — M25461 Effusion, right knee: Secondary | ICD-10-CM | POA: Diagnosis not present

## 2024-06-02 DIAGNOSIS — M25561 Pain in right knee: Secondary | ICD-10-CM

## 2024-06-02 DIAGNOSIS — M7121 Synovial cyst of popliteal space [Baker], right knee: Secondary | ICD-10-CM | POA: Diagnosis not present

## 2024-06-03 DIAGNOSIS — K5900 Constipation, unspecified: Secondary | ICD-10-CM | POA: Diagnosis not present

## 2024-06-03 DIAGNOSIS — K449 Diaphragmatic hernia without obstruction or gangrene: Secondary | ICD-10-CM | POA: Diagnosis not present

## 2024-06-03 DIAGNOSIS — R3121 Asymptomatic microscopic hematuria: Secondary | ICD-10-CM | POA: Diagnosis not present

## 2024-06-04 NOTE — Progress Notes (Signed)
 Office Visit Note   Patient: Juan Hudson           Date of Birth: 1945/12/19           MRN: [id number] Visit Date: 06/02/2024 Requested by: Shayne Anes, MD [address] [place],  [place] [address] PCP: Shayne Anes, MD  Subjective: Chief Complaint  Patient presents with   Right Knee - Pain    HPI: Juan Hudson is a 78 y.o. male who presents to the office reporting right knee pain.  Patient states that he has been told he has a Baker's cyst.  Pain has been going on for about 2 months.  Denies a history of injury.  Takes Aleve occasionally.  Does have lateral sided pain in the knee more than medial.  Denies any instability.  Hard for him to bend the knee at times.  Left knee no problem.  He does workout with a trainer.  Ultrasound negative for DVT..                ROS: All systems reviewed are negative as they relate to the chief complaint within the history of present illness.  Patient denies fevers or chills.  Assessment & Plan: Visit Diagnoses:  1. Right knee pain, unspecified chronicity     Plan: Impression is right knee effusion with Baker's cyst.  Not too much arthritis on plain radiographs.  He does however have an effusion in the right knee which is not present in the left knee.  Plan at this time is aspiration and injection of the right knee.  We did get out about 15 cc.  We also then turned him prone and aspirated about 25 cc from the Baker's cyst with near full decompression visualized under ultrasound guidance.  21-month return for clinical recheck to see about the recurrence of effusion in the knee as well as recurrence of the Baker's cyst.  Follow-Up Instructions: No follow-ups on file.   Orders:  Orders Placed This Encounter  Procedures   XR KNEE 3 VIEW RIGHT   US  Guided Needle Placement - No Linked Charges   No orders of the defined types were placed in this encounter.     Procedures: Large Joint Inj: R knee on 06/02/2024 7:19 AM Indications: diagnostic  evaluation, joint swelling and pain Details: 18 G 1.5 in needle, ultrasound-guided posterior approach  Arthrogram: No  Medications: 5 mL lidocaine  1 %; 4 mL bupivacaine  0.25 %; 40 mg triamcinolone acetonide 40 MG/ML Outcome: tolerated well, no immediate complications Procedure, treatment alternatives, risks and benefits explained, specific risks discussed. Consent was given by the patient. Immediately prior to procedure a time out was called to verify the correct patient, procedure, equipment, support staff and site/side marked as required. Patient was prepped and draped in the usual sterile fashion.     Aspiration and injection of the right knee is performed with aspiration of about 15 cc.  The patient was then placed prone and aspiration of the Baker's cyst was performed with aspiration of approximately 30 cc of fluid from the cyst.  Clinical Data: No additional findings.  Objective: Vital Signs: There were no vitals taken for this visit.  Physical Exam:  Constitutional: Patient appears well-developed HEENT:  Head: Normocephalic Eyes:EOM are normal Neck: Normal range of motion Cardiovascular: Normal rate Pulmonary/chest: Effort normal Neurologic: Patient is alert Skin: Skin is warm Psychiatric: Patient has normal mood and affect  Ortho Exam: Ortho exam demonstrates mild effusion in the right knee but no  effusion in the left knee.  Has essentially full extension in both knees but lacks about 10 degrees of flexion on the right compared to the left.  Collateral and cruciate ligaments are stable.  Not much in terms of focal joint line tenderness medially or laterally on the right-hand side with fairly equivocal McMurray compression testing on the right and left knees.  No groin pain with internal or external rotation of either leg.  Specialty Comments:  No specialty comments available.  Imaging: No results found.   PMFS History: Patient Active Problem List   Diagnosis Date  Noted   Bilateral foot pain 09/07/2021   AMS (altered mental status) 10/03/2020   Status post total replacement of right hip 07/08/2020   Status post total replacement of left hip 07/07/2020   Unilateral primary osteoarthritis, left hip 05/15/2020   Unilateral primary osteoarthritis, right hip 05/15/2020   Lumbar stenosis with neurogenic claudication 01/11/2020   Subacute bronchitis 02/23/2019   Left lumbar radiculopathy 04/08/2017   Systolic murmur 02/18/2017   Hypersomnia 10/12/2015   Obstructive sleep apnea 02/15/2011   Foot pain 01/16/2011   Metatarsalgia of right foot 11/15/2010   Loss of transverse plantar arch 11/15/2010   Past Medical History:  Diagnosis Date   Allergy    Arthritis    Cancer ([facility])    skin cancer   Cataract    early   History of kidney stones    HLD (hyperlipidemia)    Hypertension    PONV (postoperative nausea and vomiting)    Rosacea    Sleep apnea    cpap   Systolic murmur     Family History  Problem Relation Age of Onset   Heart attack Father        pacemaker   Dementia Mother    Colon cancer Neg Hx    Rectal cancer Neg Hx    Stomach cancer Neg Hx     Past Surgical History:  Procedure Laterality Date   ABDOMINAL HERNIA REPAIR     x2   ANTERIOR LAT LUMBAR FUSION N/A 01/11/2020   Procedure: Lumbar Two-three Lumbar Three-Four Anterolateral decompression/fusion;  Surgeon: Colon Shove, MD;  Location: [facility];  Service: Neurosurgery;  Laterality: N/A;  anterolateral   APPLICATION OF ROBOTIC ASSISTANCE FOR SPINAL PROCEDURE N/A 01/11/2020   Procedure: APPLICATION OF ROBOTIC ASSISTANCE FOR SPINAL PROCEDURE;  Surgeon: Colon Shove, MD;  Location: [facility];  Service: Neurosurgery;  Laterality: N/A;  posterior   COLONOSCOPY  04-01-2003   tics and hems   HEMORRHOID SURGERY     INGUINAL HERNIA REPAIR     LUMBAR PERCUTANEOUS PEDICLE SCREW 2 LEVEL N/A 01/11/2020   Procedure: Percutaneous pedicle screw fixation from Lumbar Two to Lumbar Four;  Surgeon:  Colon Shove, MD;  Location: [facility];  Service: Neurosurgery;  Laterality: N/A;  posterior   NOSE SURGERY     TONSILLECTOMY     TOTAL HIP ARTHROPLASTY Right 07/07/2020   Procedure: RIGHT TOTAL HIP ARTHROPLASTY ANTERIOR APPROACH;  Surgeon: Vernetta Lonni GRADE, MD;  Location: [facility];  Service: Orthopedics;  Laterality: Right;   TOTAL HIP ARTHROPLASTY Left 09/29/2020   Procedure: LEFT TOTAL HIP ARTHROPLASTY ANTERIOR APPROACH;  Surgeon: Vernetta Lonni GRADE, MD;  Location: [facility];  Service: Orthopedics;  Laterality: Left;   uvuloplasty     Social History   Occupational History   Occupation: Gaffer: OTHER  Tobacco Use   Smoking status: Former    Current packs/day: 0.00    Types: Cigarettes  Quit date: 07/22/1964    Years since quitting: 59.9   Smokeless tobacco: Never   Tobacco comments:    Reports smoking socially in high school    Verified by [facility] 04/04/2022  Vaping Use   Vaping status: Never Used  Substance and Sexual Activity   Alcohol  use: Yes    Alcohol /week: 4.0 standard drinks of alcohol     Types: 4 Standard drinks or equivalent per week    Comment: occasionally   Drug use: No   Sexual activity: Not on file

## 2024-06-05 ENCOUNTER — Encounter: Payer: Self-pay | Admitting: Orthopedic Surgery

## 2024-06-05 MED ORDER — BUPIVACAINE HCL 0.25 % IJ SOLN
4.0000 mL | INTRAMUSCULAR | Status: AC | PRN
  Administered 2024-06-02: 4 mL via INTRA_ARTICULAR

## 2024-06-05 MED ORDER — TRIAMCINOLONE ACETONIDE 40 MG/ML IJ SUSP
40.0000 mg | INTRAMUSCULAR | Status: AC | PRN
  Administered 2024-06-02: 40 mg via INTRA_ARTICULAR

## 2024-06-05 MED ORDER — LIDOCAINE HCL 1 % IJ SOLN
5.0000 mL | INTRAMUSCULAR | Status: AC | PRN
  Administered 2024-06-02: 5 mL

## 2024-06-07 DIAGNOSIS — Z85828 Personal history of other malignant neoplasm of skin: Secondary | ICD-10-CM | POA: Diagnosis not present

## 2024-06-07 DIAGNOSIS — C44629 Squamous cell carcinoma of skin of left upper limb, including shoulder: Secondary | ICD-10-CM | POA: Diagnosis not present

## 2024-06-07 DIAGNOSIS — D044 Carcinoma in situ of skin of scalp and neck: Secondary | ICD-10-CM | POA: Diagnosis not present

## 2024-06-07 DIAGNOSIS — L821 Other seborrheic keratosis: Secondary | ICD-10-CM | POA: Diagnosis not present

## 2024-06-07 DIAGNOSIS — L570 Actinic keratosis: Secondary | ICD-10-CM | POA: Diagnosis not present

## 2024-06-07 DIAGNOSIS — L814 Other melanin hyperpigmentation: Secondary | ICD-10-CM | POA: Diagnosis not present

## 2024-06-07 DIAGNOSIS — L578 Other skin changes due to chronic exposure to nonionizing radiation: Secondary | ICD-10-CM | POA: Diagnosis not present

## 2024-06-07 DIAGNOSIS — D485 Neoplasm of uncertain behavior of skin: Secondary | ICD-10-CM | POA: Diagnosis not present

## 2024-06-07 DIAGNOSIS — D225 Melanocytic nevi of trunk: Secondary | ICD-10-CM | POA: Diagnosis not present

## 2024-06-07 DIAGNOSIS — Z86018 Personal history of other benign neoplasm: Secondary | ICD-10-CM | POA: Diagnosis not present

## 2024-06-30 ENCOUNTER — Ambulatory Visit: Admitting: Orthopedic Surgery

## 2024-08-18 ENCOUNTER — Ambulatory Visit: Admitting: Orthopedic Surgery

## 2024-08-19 ENCOUNTER — Other Ambulatory Visit: Payer: Self-pay

## 2024-08-19 ENCOUNTER — Ambulatory Visit: Admitting: Orthopedic Surgery

## 2024-08-19 ENCOUNTER — Encounter: Payer: Self-pay | Admitting: Orthopedic Surgery

## 2024-08-19 DIAGNOSIS — M25461 Effusion, right knee: Secondary | ICD-10-CM

## 2024-08-19 DIAGNOSIS — M7121 Synovial cyst of popliteal space [Baker], right knee: Secondary | ICD-10-CM | POA: Diagnosis not present

## 2024-08-19 MED ORDER — LIDOCAINE HCL 1 % IJ SOLN
5.0000 mL | INTRAMUSCULAR | Status: AC | PRN
  Administered 2024-08-19: 5 mL

## 2024-08-19 MED ORDER — BUPIVACAINE HCL 0.25 % IJ SOLN
4.0000 mL | INTRAMUSCULAR | Status: AC | PRN
  Administered 2024-08-19: 4 mL via INTRA_ARTICULAR

## 2024-08-19 NOTE — Progress Notes (Signed)
 "  Office Visit Note   Patient: Juan Hudson           Date of Birth: October 26, 1945           MRN: [id number] Visit Date: 08/19/2024 Requested by: Shayne Anes, MD [address] [place],  [place] [address] PCP: Shayne Anes, MD  Subjective: Chief Complaint  Patient presents with   Right Knee - Pain    HPI: Juan Hudson is a 79 y.o. male who presents to the office reporting recurrent right knee pain.  Had aspiration and cortisone injection performed 06/02/2024 which did help.  Reports some pain and tightness in the knee.  Takes Aleve alternating with ibuprofen.  Symptoms are worse when he bends his knee..                ROS: All systems reviewed are negative as they relate to the chief complaint within the history of present illness.  Patient denies fevers or chills.  Assessment & Plan: Visit Diagnoses:  1. Baker's cyst, right     Plan: Impression is mild recurrence of right knee pain.  Right knee is aspirated and injected with Toradol  today.  We also aspirated the Baker's cyst under ultrasound guidance.  I think it is going to help him for a while.  Could consider repeat cortisone injection sometime within the next 4 to 6 months depending on how his symptoms evolve.  Not really there yet for knee replacement based on radiographs and the amount of disability he is having.  Whether or not he is a candidate for any type of arthroscopy and meniscal debridement really depends on his symptoms, his response to the injection, and any MRI findings that we pursue based on failure of nonoperative management.  Follow-Up Instructions: No follow-ups on file.   Orders:  Orders Placed This Encounter  Procedures   US  Guided Needle Placement - No Linked Charges   No orders of the defined types were placed in this encounter.     Procedures: Large Joint Inj: R knee on 08/19/2024 3:35 PM Indications: diagnostic evaluation, joint swelling and pain Details: 18 G 1.5 in needle, superolateral  approach  Arthrogram: No  Medications: 5 mL lidocaine  1 %; 4 mL bupivacaine  0.25 % Outcome: tolerated well, no immediate complications Procedure, treatment alternatives, risks and benefits explained, specific risks discussed. Consent was given by the patient. Immediately prior to procedure a time out was called to verify the correct patient, procedure, equipment, support staff and site/side marked as required. Patient was prepped and draped in the usual sterile fashion.    Large Joint Inj: R knee on 08/19/2024 3:36 PM Indications: diagnostic evaluation, joint swelling and pain Details: 18 G 1.5 in needle, ultrasound-guided posterior approach  Arthrogram: No  Medications: 5 mL lidocaine  1 % Aspirate: 30 mL Outcome: tolerated well, no immediate complications Procedure, treatment alternatives, risks and benefits explained, specific risks discussed. Consent was given by the patient. Immediately prior to procedure a time out was called to verify the correct patient, procedure, equipment, support staff and site/side marked as required. Patient was prepped and draped in the usual sterile fashion.    Toradol  injected   Clinical Data: No additional findings.  Objective: Vital Signs: There were no vitals taken for this visit.  Physical Exam:  Constitutional: Patient appears well-developed HEENT:  Head: Normocephalic Eyes:EOM are normal Neck: Normal range of motion Cardiovascular: Normal rate Pulmonary/chest: Effort normal Neurologic: Patient is alert Skin: Skin is warm Psychiatric: Patient has normal mood  and affect  Ortho Exam: Right knee range of motion is 0-1 20.  Collateral cruciate ligaments are stable.  Mild effusion present in the right knee.  No focal joint line tenderness is present.  Baker's cyst is palpable.  Pedal pulses palpable.  Ankle dorsiflexion intact.  Specialty Comments:  No specialty comments available.  Imaging: No results found.   PMFS History: Patient  Active Problem List   Diagnosis Date Noted   Bilateral foot pain 09/07/2021   AMS (altered mental status) 10/03/2020   Status post total replacement of right hip 07/08/2020   Status post total replacement of left hip 07/07/2020   Unilateral primary osteoarthritis, left hip 05/15/2020   Unilateral primary osteoarthritis, right hip 05/15/2020   Lumbar stenosis with neurogenic claudication 01/11/2020   Subacute bronchitis 02/23/2019   Left lumbar radiculopathy 04/08/2017   Systolic murmur 02/18/2017   Hypersomnia 10/12/2015   Obstructive sleep apnea 02/15/2011   Foot pain 01/16/2011   Metatarsalgia of right foot 11/15/2010   Loss of transverse plantar arch 11/15/2010   Past Medical History:  Diagnosis Date   Allergy    Arthritis    Cancer (HCC)    skin cancer   Cataract    early   History of kidney stones    HLD (hyperlipidemia)    Hypertension    PONV (postoperative nausea and vomiting)    Rosacea    Sleep apnea    cpap   Systolic murmur     Family History  Problem Relation Age of Onset   Heart attack Father        pacemaker   Dementia Mother    Colon cancer Neg Hx    Rectal cancer Neg Hx    Stomach cancer Neg Hx     Past Surgical History:  Procedure Laterality Date   ABDOMINAL HERNIA REPAIR     x2   ANTERIOR LAT LUMBAR FUSION N/A 01/11/2020   Procedure: Lumbar Two-three Lumbar Three-Four Anterolateral decompression/fusion;  Surgeon: Colon Shove, MD;  Location: [facility] OR;  Service: Neurosurgery;  Laterality: N/A;  anterolateral   APPLICATION OF ROBOTIC ASSISTANCE FOR SPINAL PROCEDURE N/A 01/11/2020   Procedure: APPLICATION OF ROBOTIC ASSISTANCE FOR SPINAL PROCEDURE;  Surgeon: Colon Shove, MD;  Location: [facility] OR;  Service: Neurosurgery;  Laterality: N/A;  posterior   COLONOSCOPY  04-01-2003   tics and hems   HEMORRHOID SURGERY     INGUINAL HERNIA REPAIR     LUMBAR PERCUTANEOUS PEDICLE SCREW 2 LEVEL N/A 01/11/2020   Procedure: Percutaneous pedicle screw fixation from  Lumbar Two to Lumbar Four;  Surgeon: Colon Shove, MD;  Location: [facility] OR;  Service: Neurosurgery;  Laterality: N/A;  posterior   NOSE SURGERY     TONSILLECTOMY     TOTAL HIP ARTHROPLASTY Right 07/07/2020   Procedure: RIGHT TOTAL HIP ARTHROPLASTY ANTERIOR APPROACH;  Surgeon: Vernetta Lonni GRADE, MD;  Location: [facility] ORS;  Service: Orthopedics;  Laterality: Right;   TOTAL HIP ARTHROPLASTY Left 09/29/2020   Procedure: LEFT TOTAL HIP ARTHROPLASTY ANTERIOR APPROACH;  Surgeon: Vernetta Lonni GRADE, MD;  Location: [facility];  Service: Orthopedics;  Laterality: Left;   uvuloplasty     Social History   Occupational History   Occupation: Gaffer: [employer]  Tobacco Use   Smoking status: Former    Current packs/day: 0.00    Types: Cigarettes    Quit date: 07/22/1964    Years since quitting: 60.1   Smokeless tobacco: Never   Tobacco comments:    Reports smoking  socially in high school    Verified by [facility] 04/04/2022  Vaping Use   Vaping status: Never Used  Substance and Sexual Activity   Alcohol  use: Yes    Alcohol /week: 4.0 standard drinks of alcohol     Types: 4 Standard drinks or equivalent per week    Comment: occasionally   Drug use: No   Sexual activity: Not on file        "
